# Patient Record
Sex: Female | Born: 1970 | Race: White | Hispanic: No | Marital: Married | Smoking: Never smoker
Health system: Southern US, Community
[De-identification: ages and names within clinical notes are randomized; demographics above are authoritative.]

## PROBLEM LIST (undated history)

## (undated) ENCOUNTER — Emergency Department (HOSPITAL_COMMUNITY): Disposition: A | Discharge status: Home / Self Care | Payer: Self-pay

## (undated) DIAGNOSIS — I10 Essential (primary) hypertension: Secondary | ICD-10-CM

## (undated) DIAGNOSIS — M4640 Discitis, unspecified, site unspecified: Secondary | ICD-10-CM

## (undated) DIAGNOSIS — G2581 Restless legs syndrome: Secondary | ICD-10-CM

## (undated) DIAGNOSIS — M549 Dorsalgia, unspecified: Secondary | ICD-10-CM

## (undated) DIAGNOSIS — E039 Hypothyroidism, unspecified: Secondary | ICD-10-CM

## (undated) DIAGNOSIS — K760 Fatty (change of) liver, not elsewhere classified: Secondary | ICD-10-CM

## (undated) DIAGNOSIS — I340 Nonrheumatic mitral (valve) insufficiency: Secondary | ICD-10-CM

## (undated) DIAGNOSIS — F419 Anxiety disorder, unspecified: Secondary | ICD-10-CM

## (undated) DIAGNOSIS — M797 Fibromyalgia: Secondary | ICD-10-CM

## (undated) DIAGNOSIS — M160 Bilateral primary osteoarthritis of hip: Secondary | ICD-10-CM

## (undated) DIAGNOSIS — K219 Gastro-esophageal reflux disease without esophagitis: Secondary | ICD-10-CM

## (undated) DIAGNOSIS — F32A Depression, unspecified: Secondary | ICD-10-CM

## (undated) DIAGNOSIS — M461 Sacroiliitis, not elsewhere classified: Secondary | ICD-10-CM

## (undated) DIAGNOSIS — Z8719 Personal history of other diseases of the digestive system: Secondary | ICD-10-CM

## (undated) DIAGNOSIS — E785 Hyperlipidemia, unspecified: Secondary | ICD-10-CM

## (undated) DIAGNOSIS — G894 Chronic pain syndrome: Secondary | ICD-10-CM

## (undated) DIAGNOSIS — E079 Disorder of thyroid, unspecified: Secondary | ICD-10-CM

## (undated) DIAGNOSIS — E119 Type 2 diabetes mellitus without complications: Secondary | ICD-10-CM

## (undated) DIAGNOSIS — Z9889 Other specified postprocedural states: Secondary | ICD-10-CM

## (undated) DIAGNOSIS — M545 Low back pain, unspecified: Secondary | ICD-10-CM

## (undated) DIAGNOSIS — G8929 Other chronic pain: Secondary | ICD-10-CM

## (undated) DIAGNOSIS — G4733 Obstructive sleep apnea (adult) (pediatric): Secondary | ICD-10-CM

## (undated) DIAGNOSIS — Z79899 Other long term (current) drug therapy: Secondary | ICD-10-CM

## (undated) HISTORY — DX: Discitis, unspecified, site unspecified: M46.40

## (undated) HISTORY — DX: Nonrheumatic mitral (valve) insufficiency: I34.0

## (undated) HISTORY — DX: Type 2 diabetes mellitus without complications: E11.9

## (undated) HISTORY — DX: Gastro-esophageal reflux disease without esophagitis: K21.9

## (undated) HISTORY — DX: Hyperlipidemia, unspecified: E78.5

## (undated) HISTORY — DX: Other specified postprocedural states: Z98.890

## (undated) HISTORY — PX: LASIK: SHX215

## (undated) HISTORY — DX: Sacroiliitis, not elsewhere classified: M46.1

## (undated) HISTORY — PX: CERVICAL SPINE SURGERY: SHX589

## (undated) HISTORY — DX: Other long term (current) drug therapy: Z79.899

## (undated) HISTORY — DX: Anxiety disorder, unspecified: F41.9

## (undated) HISTORY — DX: Fatty (change of) liver, not elsewhere classified: K76.0

## (undated) HISTORY — PX: COLONOSCOPY: SHX174

## (undated) HISTORY — DX: Other chronic pain: G89.29

## (undated) HISTORY — DX: Dorsalgia, unspecified: M54.9

## (undated) HISTORY — DX: Disorder of thyroid, unspecified: E07.9

## (undated) HISTORY — DX: Bilateral primary osteoarthritis of hip: M16.0

## (undated) HISTORY — DX: Personal history of other diseases of the digestive system: Z87.19

## (undated) HISTORY — DX: Obstructive sleep apnea (adult) (pediatric): G47.33

## (undated) HISTORY — DX: Low back pain, unspecified: M54.50

## (undated) HISTORY — DX: Chronic pain syndrome: G89.4

## (undated) HISTORY — DX: Hypothyroidism, unspecified: E03.9

## (undated) HISTORY — PX: CHOLECYSTECTOMY: SHX55

## (undated) HISTORY — PX: TRANSTHORACIC ECHOCARDIOGRAM: SHX275

## (undated) HISTORY — DX: Depression, unspecified: F32.A

## (undated) HISTORY — PX: ESOPHAGOGASTRODUODENOSCOPY: SHX1529

## (undated) HISTORY — DX: Restless legs syndrome: G25.81

---

## 1996-03-28 HISTORY — PX: PLANTAR FASCIA SURGERY: SHX746

## 2007-03-05 ENCOUNTER — Encounter: Admission: RE | Admit: 2007-03-05 | Discharge: 2007-03-05 | Payer: Self-pay | Admitting: Internal Medicine

## 2007-03-05 IMAGING — US US ABDOMEN COMPLETE
1 series · 14 of 25 positions shown · non-contrast
Comparison: none

CLINICAL DATA: Elevated liver enzymes.
 ABDOMEN ULTRASOUND:
TECHNIQUE: Complete abdominal ultrasound examination was performed including evaluation of the liver, gallbladder, bile ducts, pancreas, kidneys, spleen, IVC, and abdominal aorta.
 The gallbladder has previously been removed.  The liver is diffusely echogenic consistent with fatty infiltration.   The common bile duct is normal measuring between 4 and 5 mm in diameter.  The IVC, pancreas, and spleen appear normal.  No hydronephrosis is noted.  The right kidney measures 11.9 cm sagittally with the left kidney measuring 12.9 cm.  The abdominal aorta is normal in caliber.  This study is somewhat limited by body habitus.

[Series 1: us abdomen complete · 0.24mm/px · 14 of 83 slices shown]
[im 1/83]
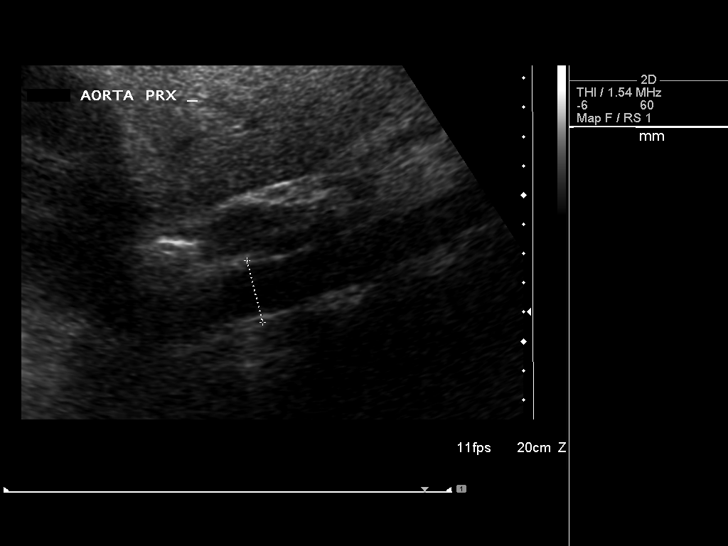
[im 7/83]
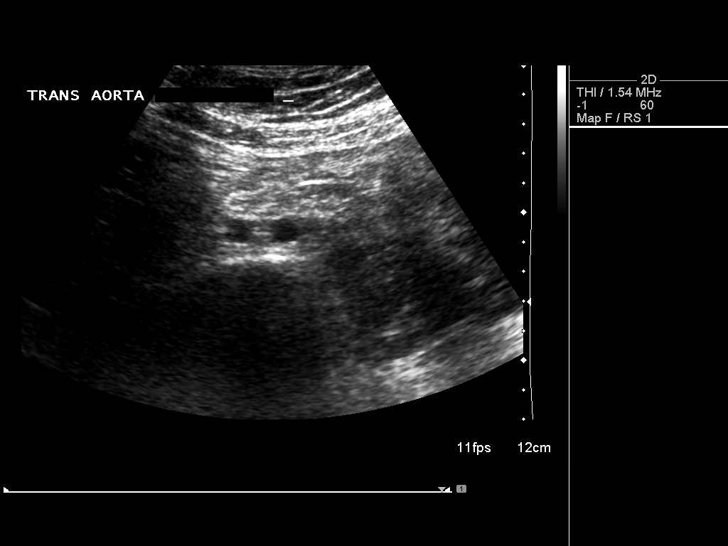
[im 14/83]
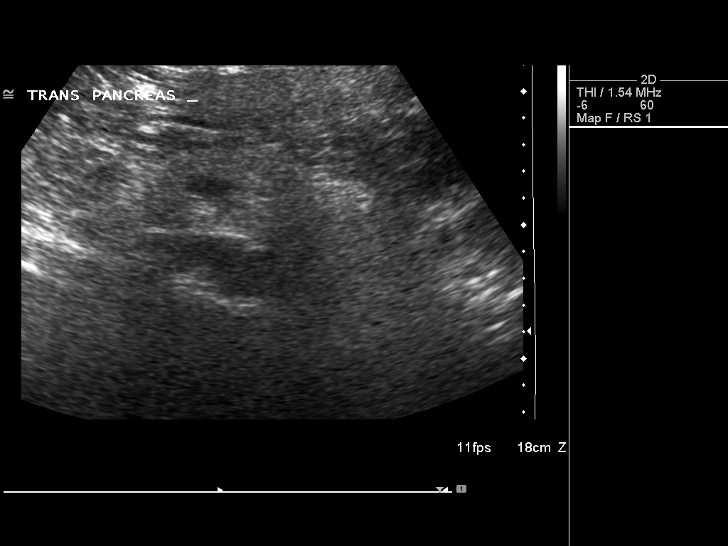
[im 21/83]
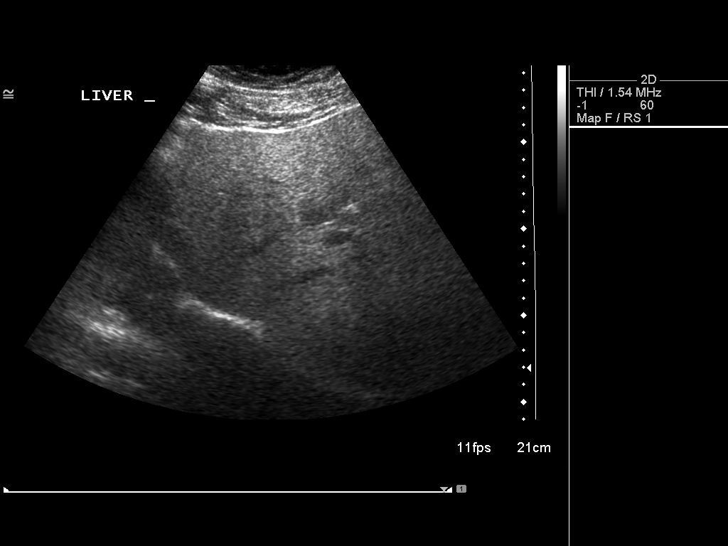
[im 28/83]
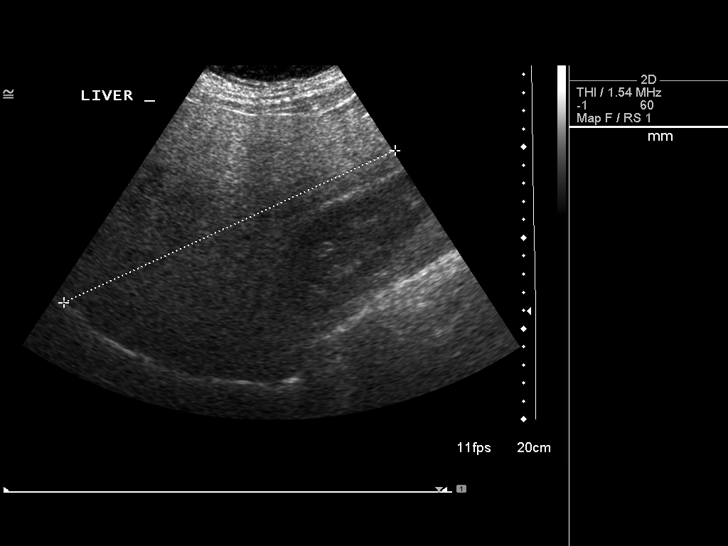
[im 31/83]
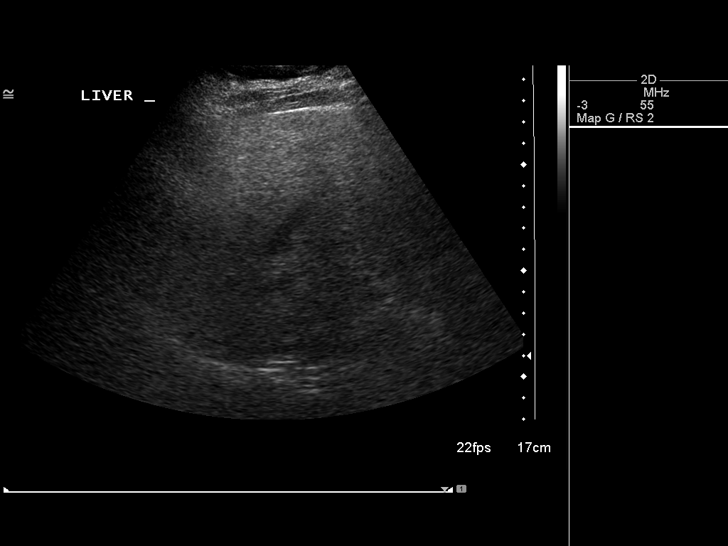
[im 38/83]
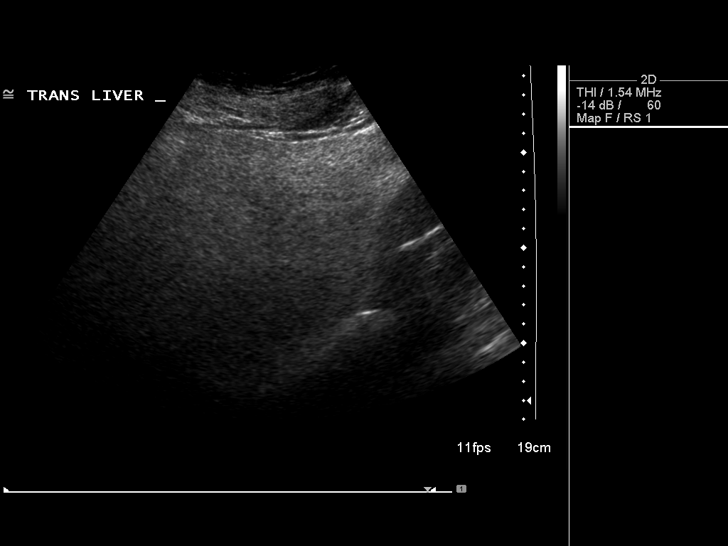
[im 45/83]
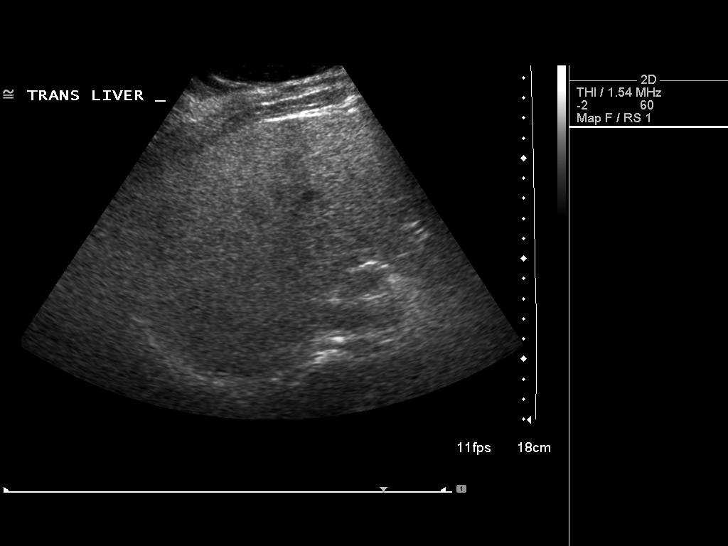
[im 52/83]
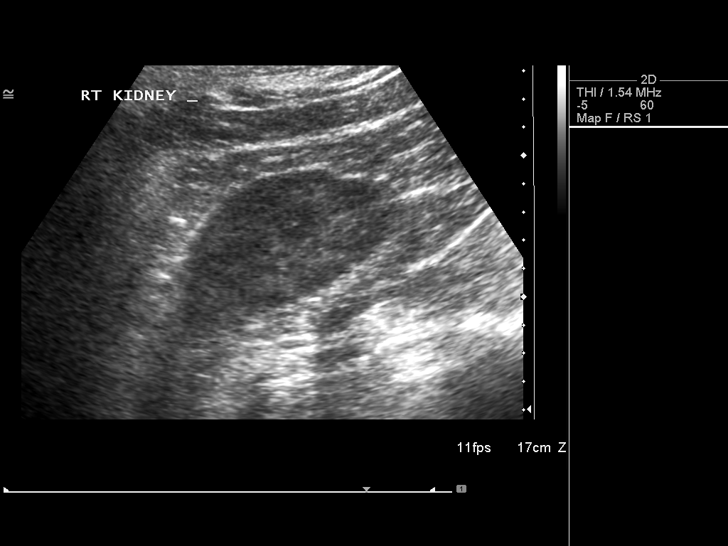
[im 55/83]
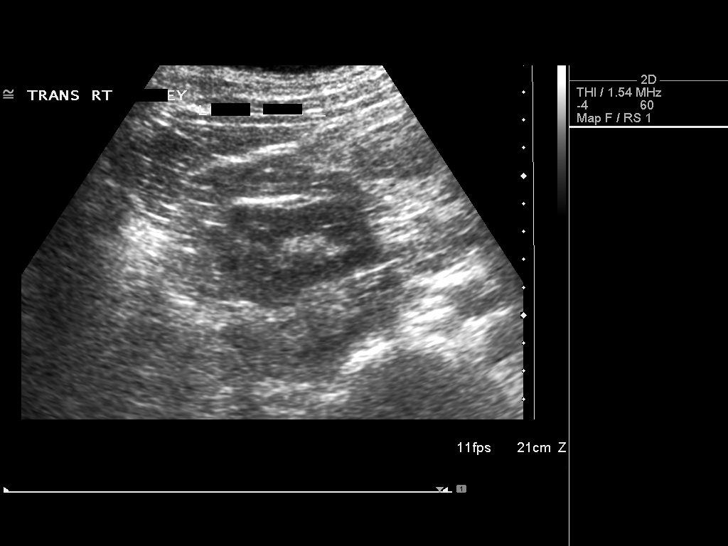
[im 62/83]
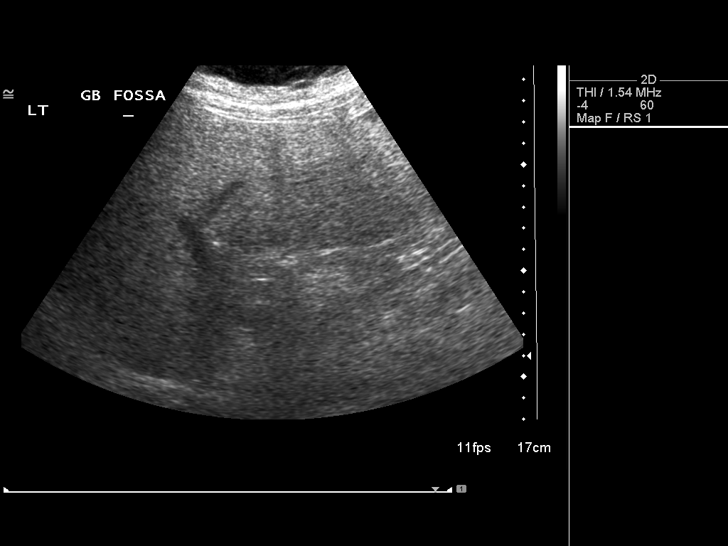
[im 69/83]
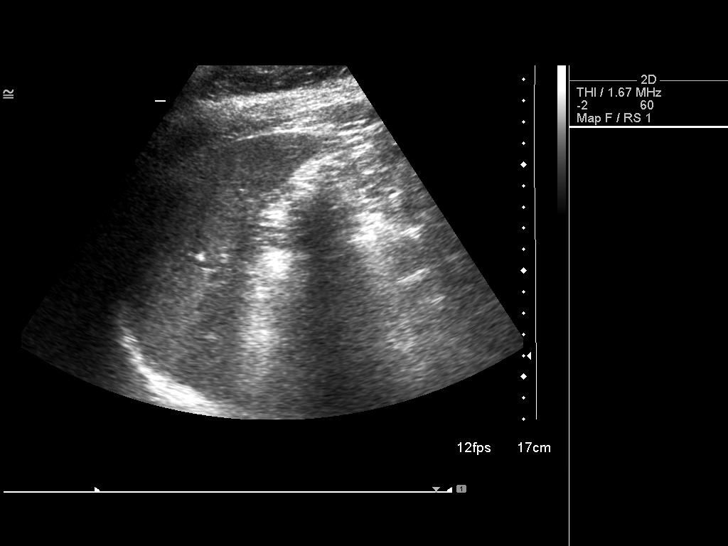
[im 76/83]
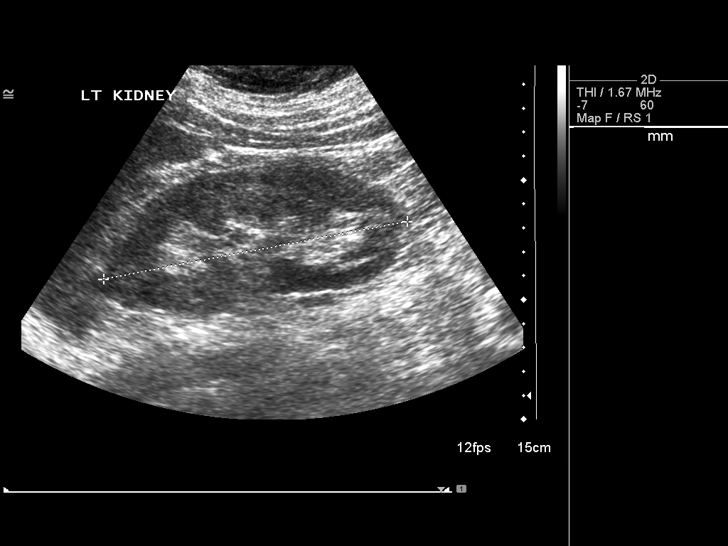
[im 83/83]
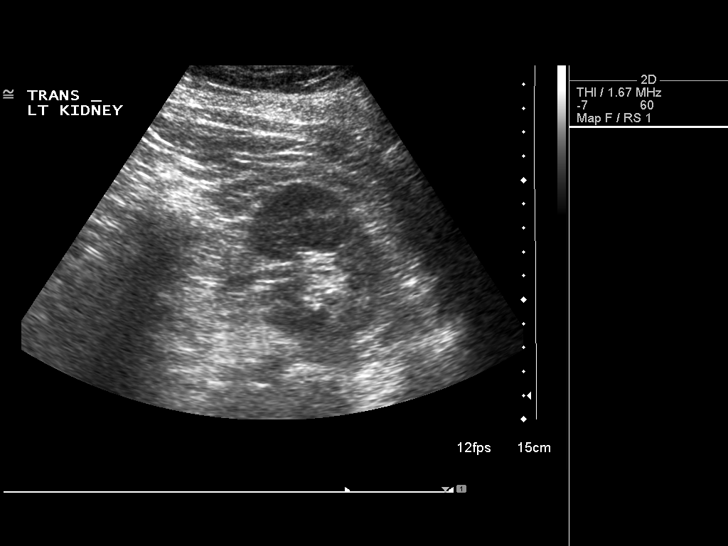

[14 of 25 positions shown; findings below may reference images not displayed]

IMPRESSION: 1.  Fatty infiltration of the liver.
 2.  Prior cholecystectomy.
 3.  Study is somewhat limited by body habitus.

## 2009-11-04 ENCOUNTER — Ambulatory Visit (HOSPITAL_COMMUNITY): Admission: RE | Admit: 2009-11-04 | Discharge: 2009-11-04 | Payer: Self-pay | Admitting: Family Medicine

## 2009-11-04 IMAGING — CR DG CHEST 2V
2 series · 2 of 2 positions shown · non-contrast
Comparison: None

CLINICAL DATA: Left chest pain, costochondritis

CHEST - 2 VIEW

[view not recorded (1 of 2)]
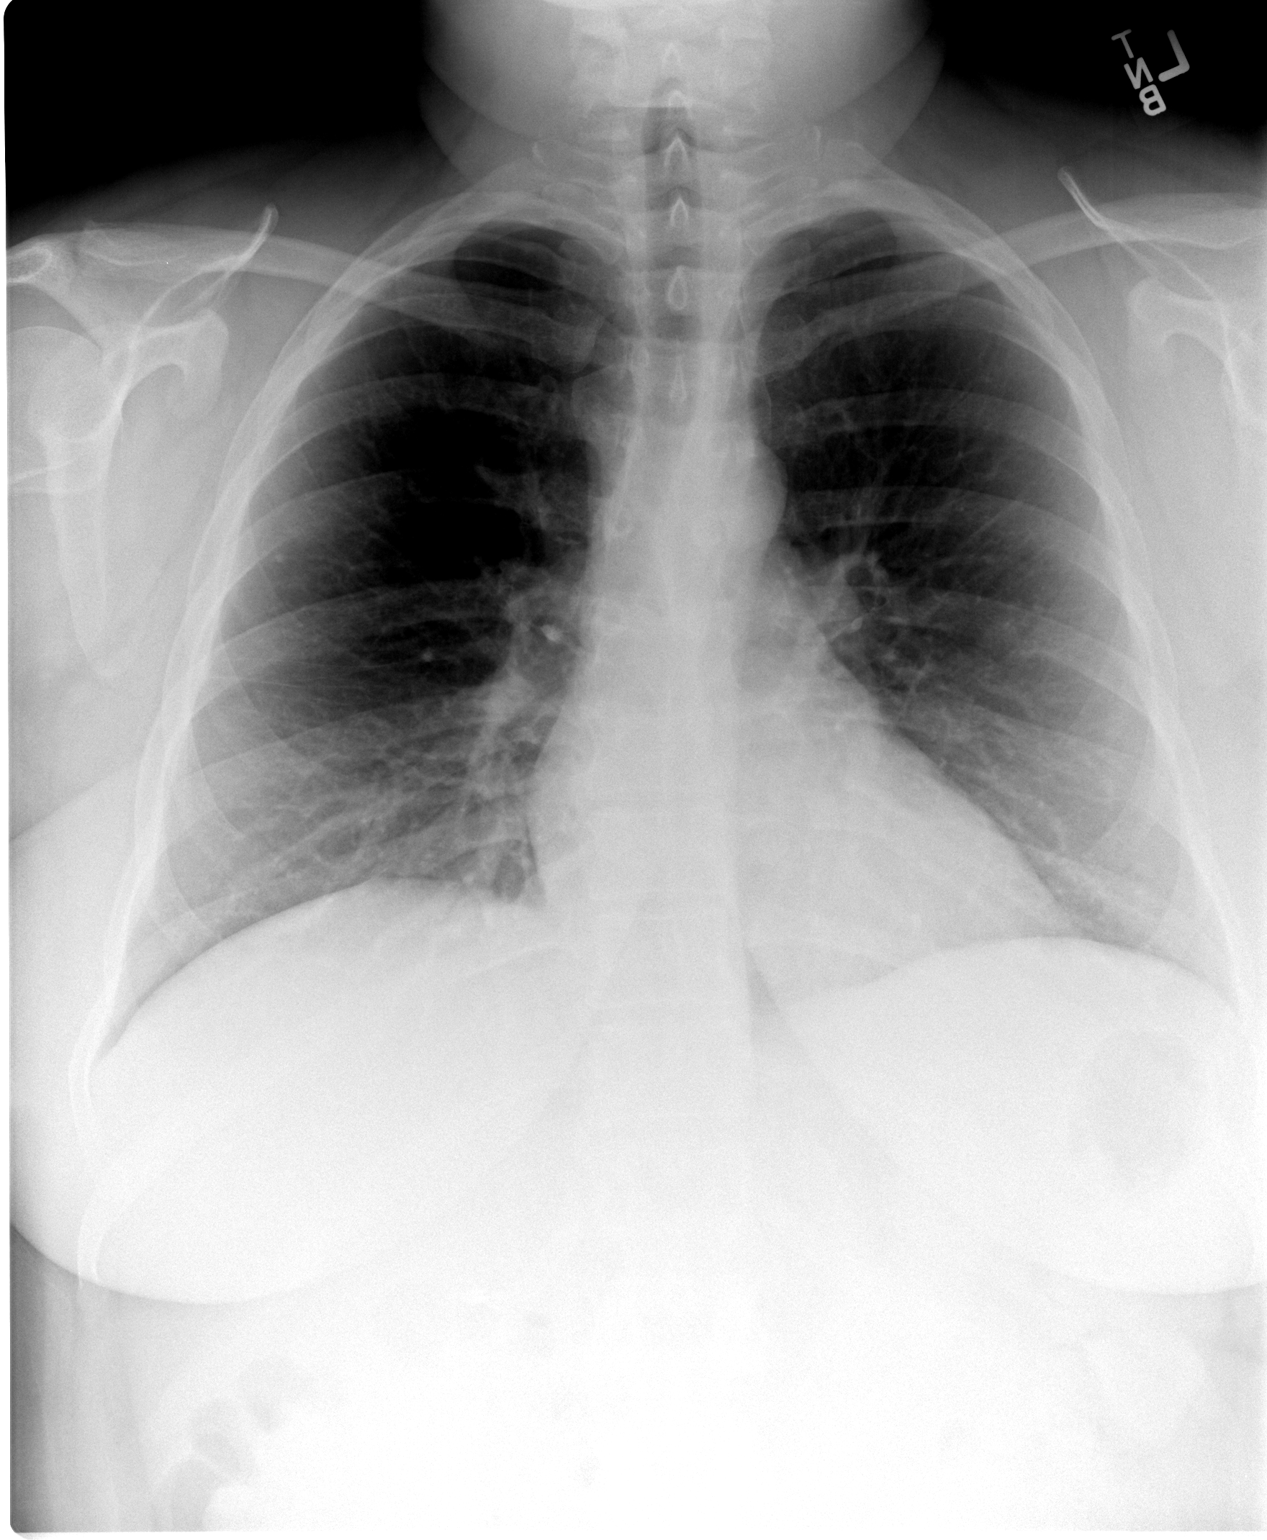

[view not recorded (2 of 2)]
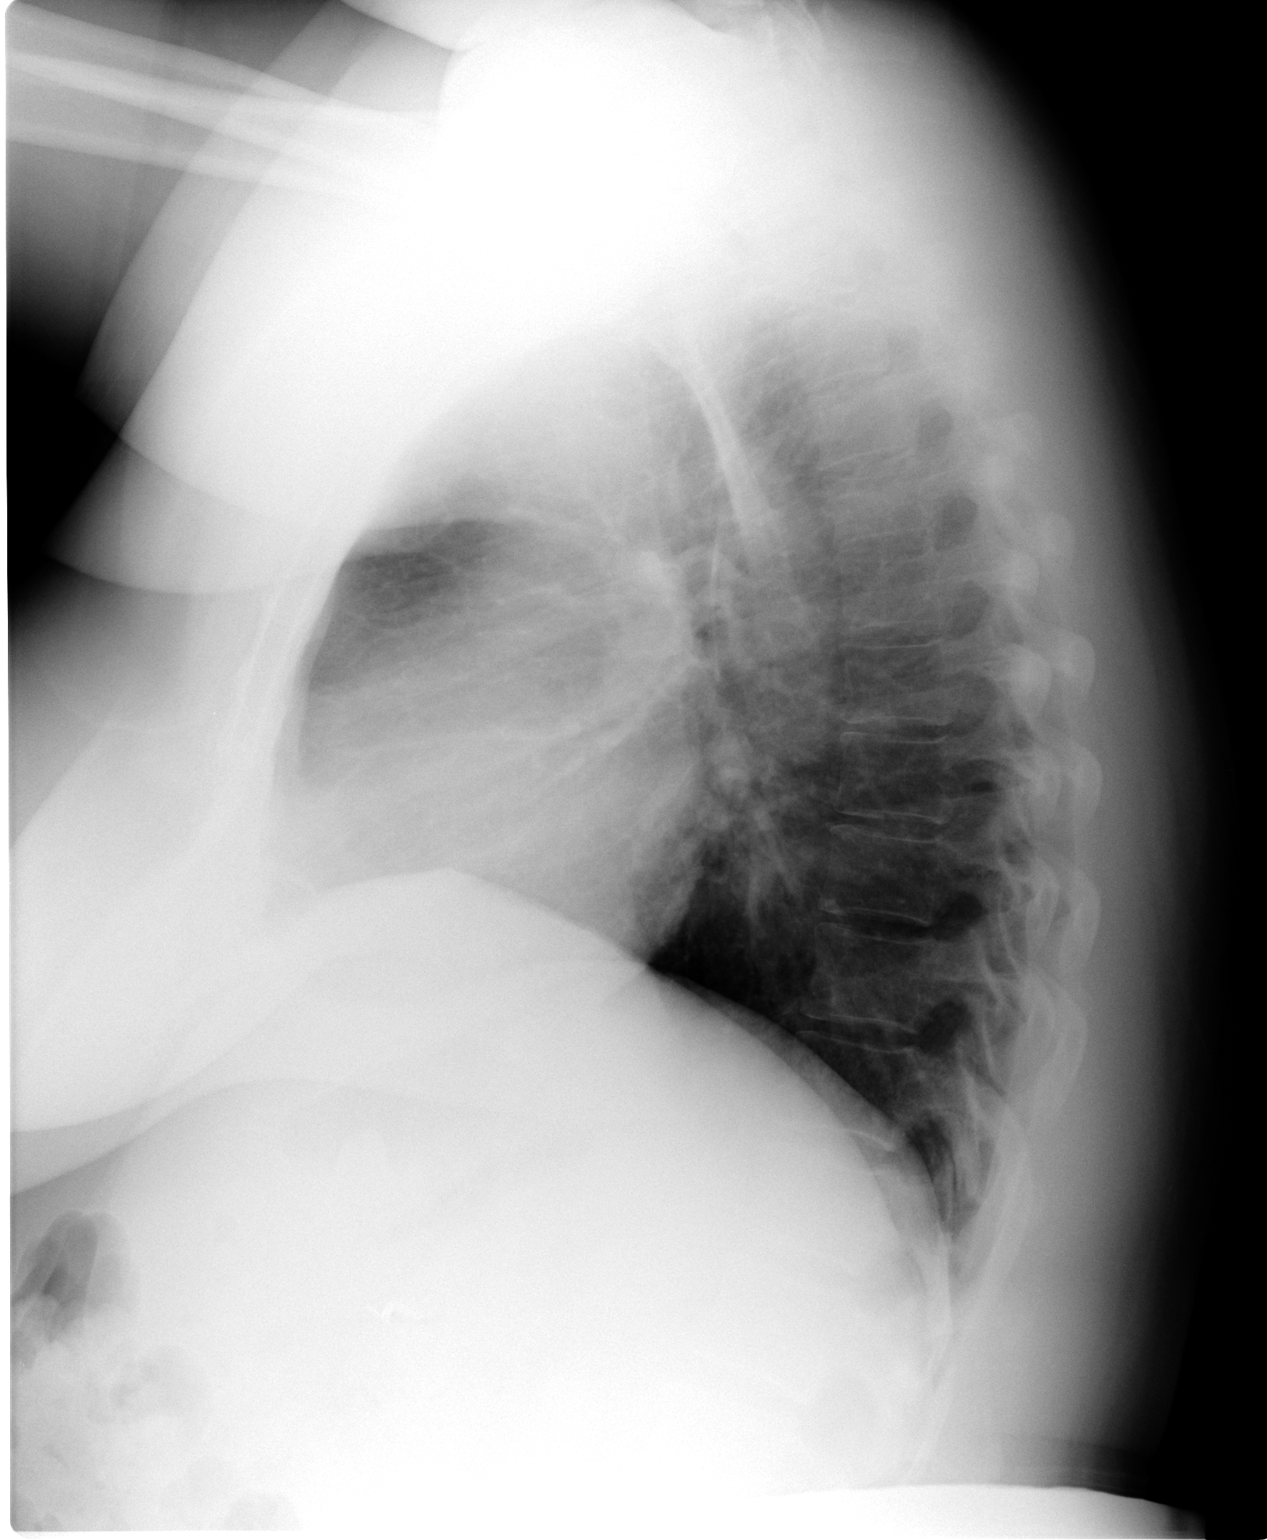

[2 of 2 positions shown; findings below may reference images not displayed]

FINDINGS: Normal heart size, mediastinal contours, and pulmonary vascularity.
Lungs clear.
No pleural effusion or pneumothorax.
Minimal broad-based dextroconvex thoracic scoliosis.
No chest wall abnormalities radiographically identified.
IMPRESSION: No acute abnormalities.

## 2010-04-18 ENCOUNTER — Encounter: Payer: Self-pay | Admitting: Internal Medicine

## 2010-06-30 ENCOUNTER — Ambulatory Visit: Payer: Self-pay | Admitting: Gynecology

## 2012-08-09 ENCOUNTER — Ambulatory Visit
Admission: RE | Admit: 2012-08-09 | Discharge: 2012-08-09 | Disposition: A | Discharge status: Home / Self Care | Payer: BC Managed Care – PPO | Source: Ambulatory Visit | Attending: Allergy and Immunology | Admitting: Allergy and Immunology

## 2012-08-09 ENCOUNTER — Other Ambulatory Visit: Payer: Self-pay | Admitting: Allergy and Immunology

## 2012-08-09 DIAGNOSIS — R05 Cough: Secondary | ICD-10-CM

## 2012-08-09 IMAGING — CR DG CHEST 2V
2 series · 2 of 2 positions shown · non-contrast
Comparison: PA and lateral chest [DATE].

CLINICAL DATA: Cough and shortness of breath.

CHEST - 2 VIEW

[w chest pa]
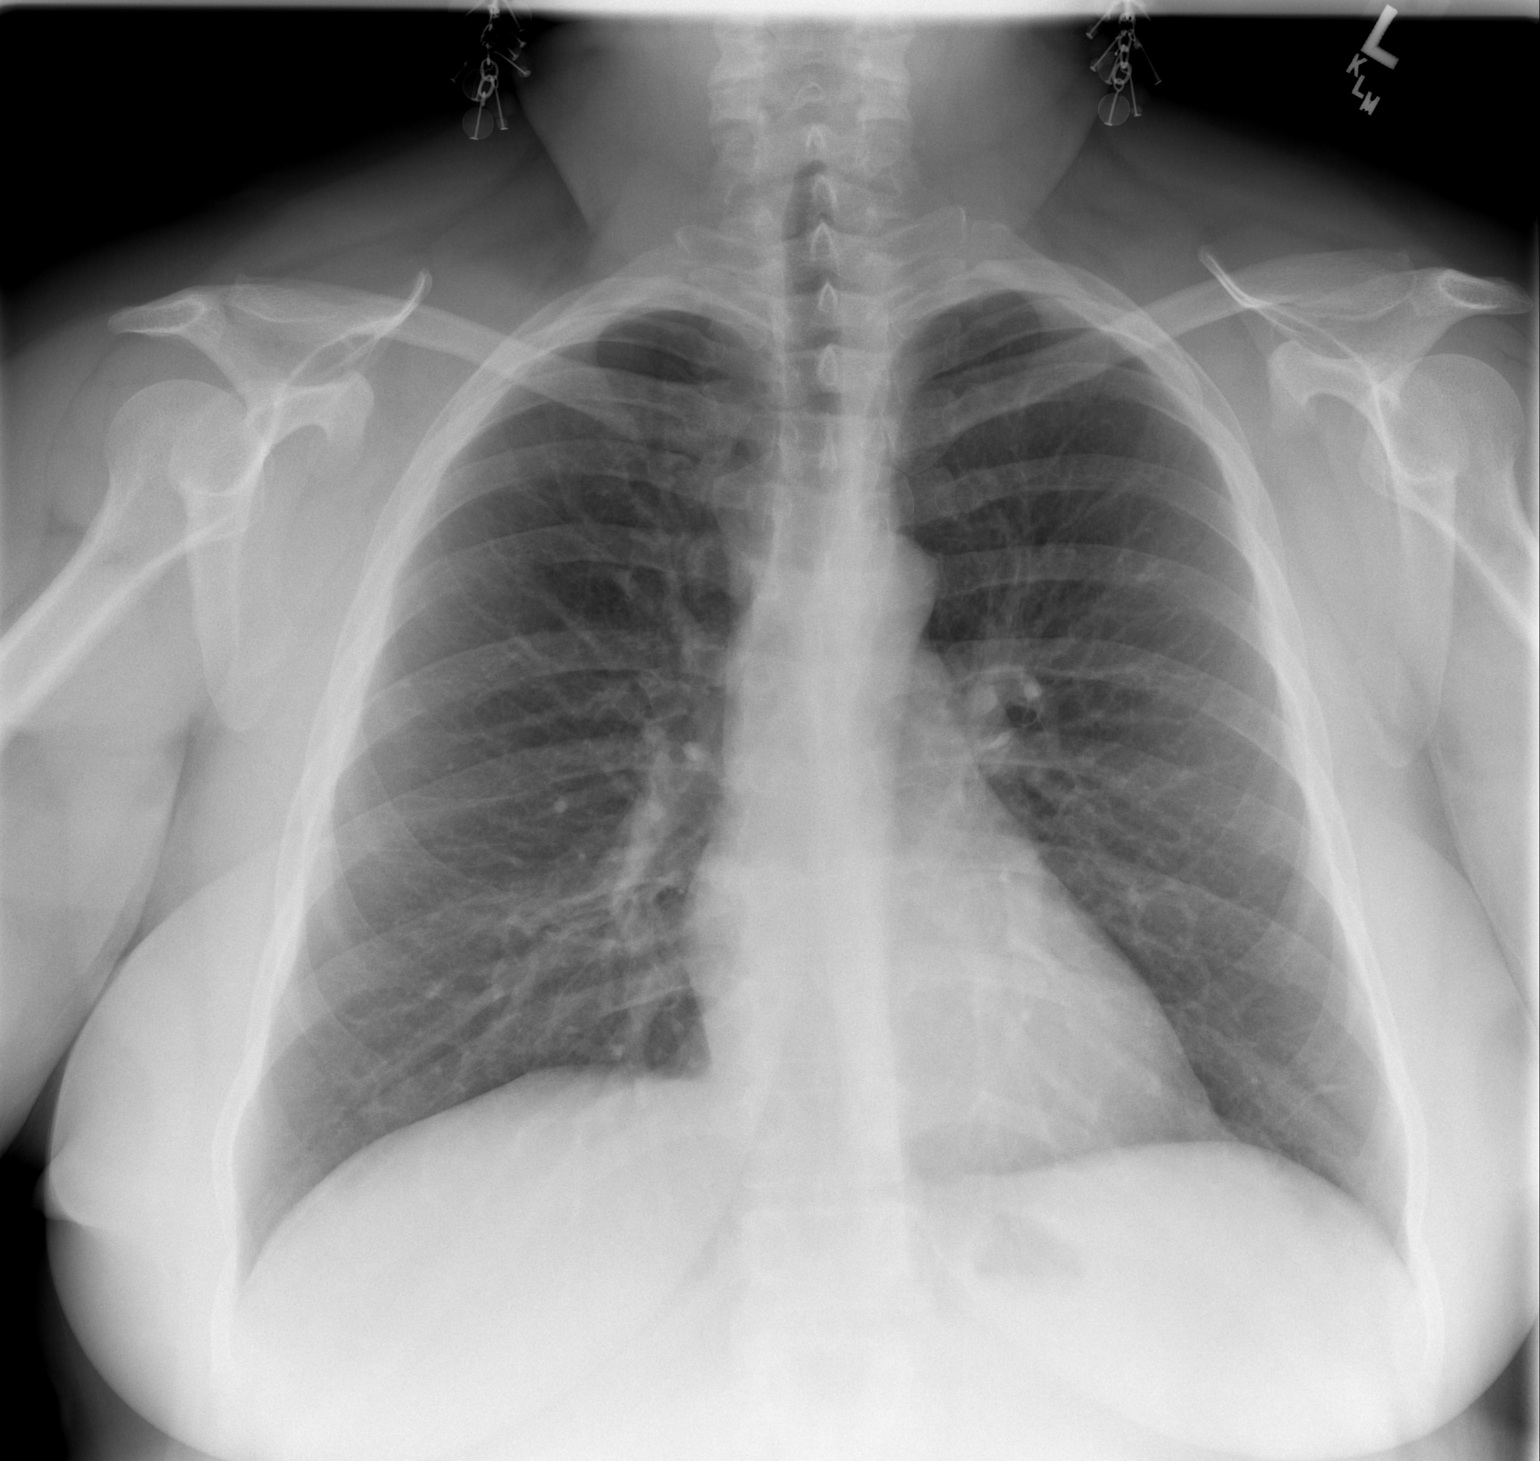

[w chest lat]
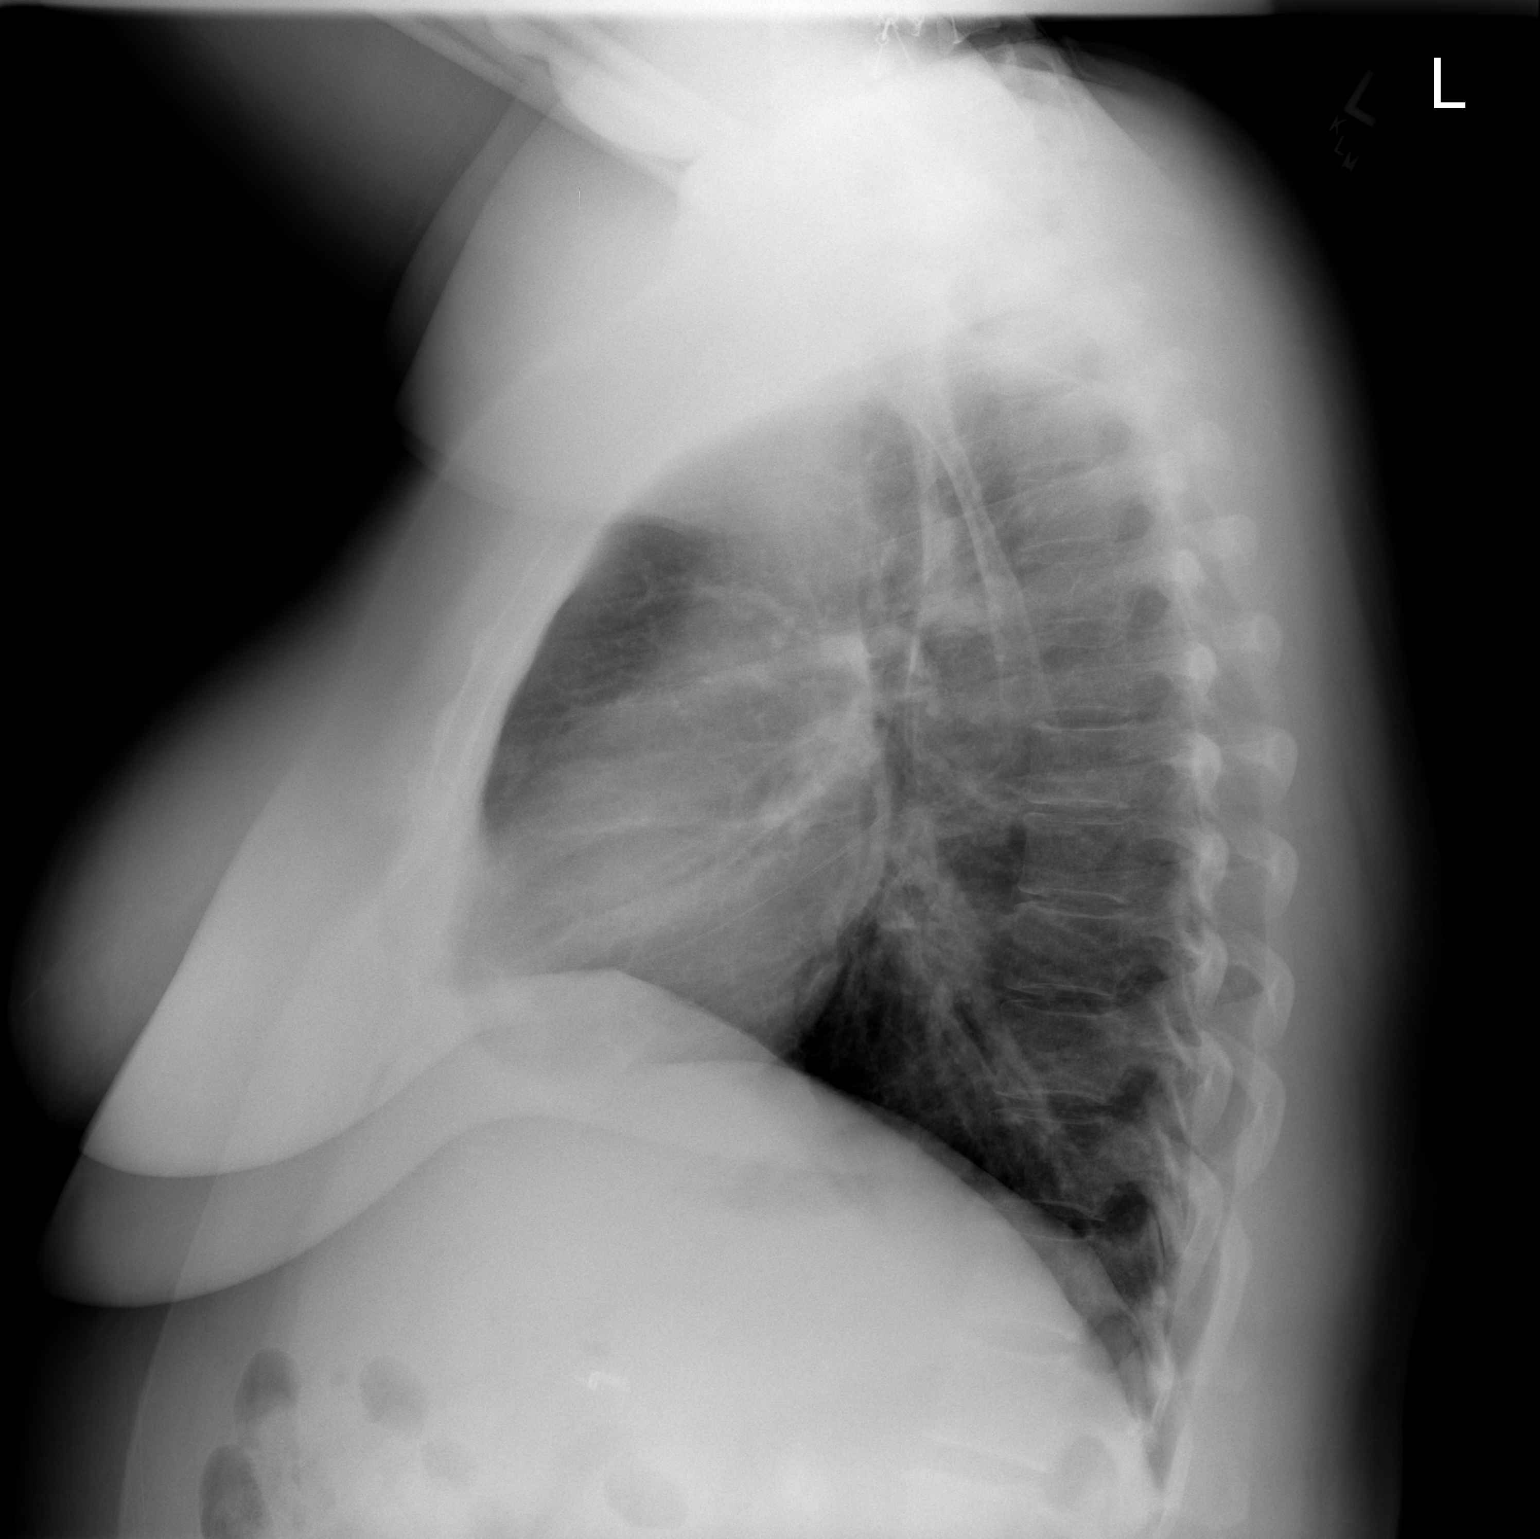

[2 of 2 positions shown; findings below may reference images not displayed]

FINDINGS: Lungs are clear.  Heart size is normal.  No pneumothorax
or pleural effusion.
IMPRESSION: Negative chest.

## 2013-06-24 ENCOUNTER — Other Ambulatory Visit (HOSPITAL_COMMUNITY): Payer: Self-pay | Admitting: Physician Assistant

## 2013-06-24 ENCOUNTER — Ambulatory Visit (HOSPITAL_COMMUNITY)
Admission: RE | Admit: 2013-06-24 | Discharge: 2013-06-24 | Disposition: A | Discharge status: Home / Self Care | Payer: BC Managed Care – PPO | Source: Ambulatory Visit | Attending: Physician Assistant | Admitting: Physician Assistant

## 2013-06-24 DIAGNOSIS — R079 Chest pain, unspecified: Secondary | ICD-10-CM

## 2013-06-24 DIAGNOSIS — W19XXXA Unspecified fall, initial encounter: Secondary | ICD-10-CM | POA: Insufficient documentation

## 2013-06-24 IMAGING — CR DG CHEST 2V
2 series · 2 of 2 positions shown · non-contrast
Comparison: [DATE]

CLINICAL DATA: Left chest pain, fall

EXAM:
CHEST  2 VIEW

[view not recorded (1 of 2)]
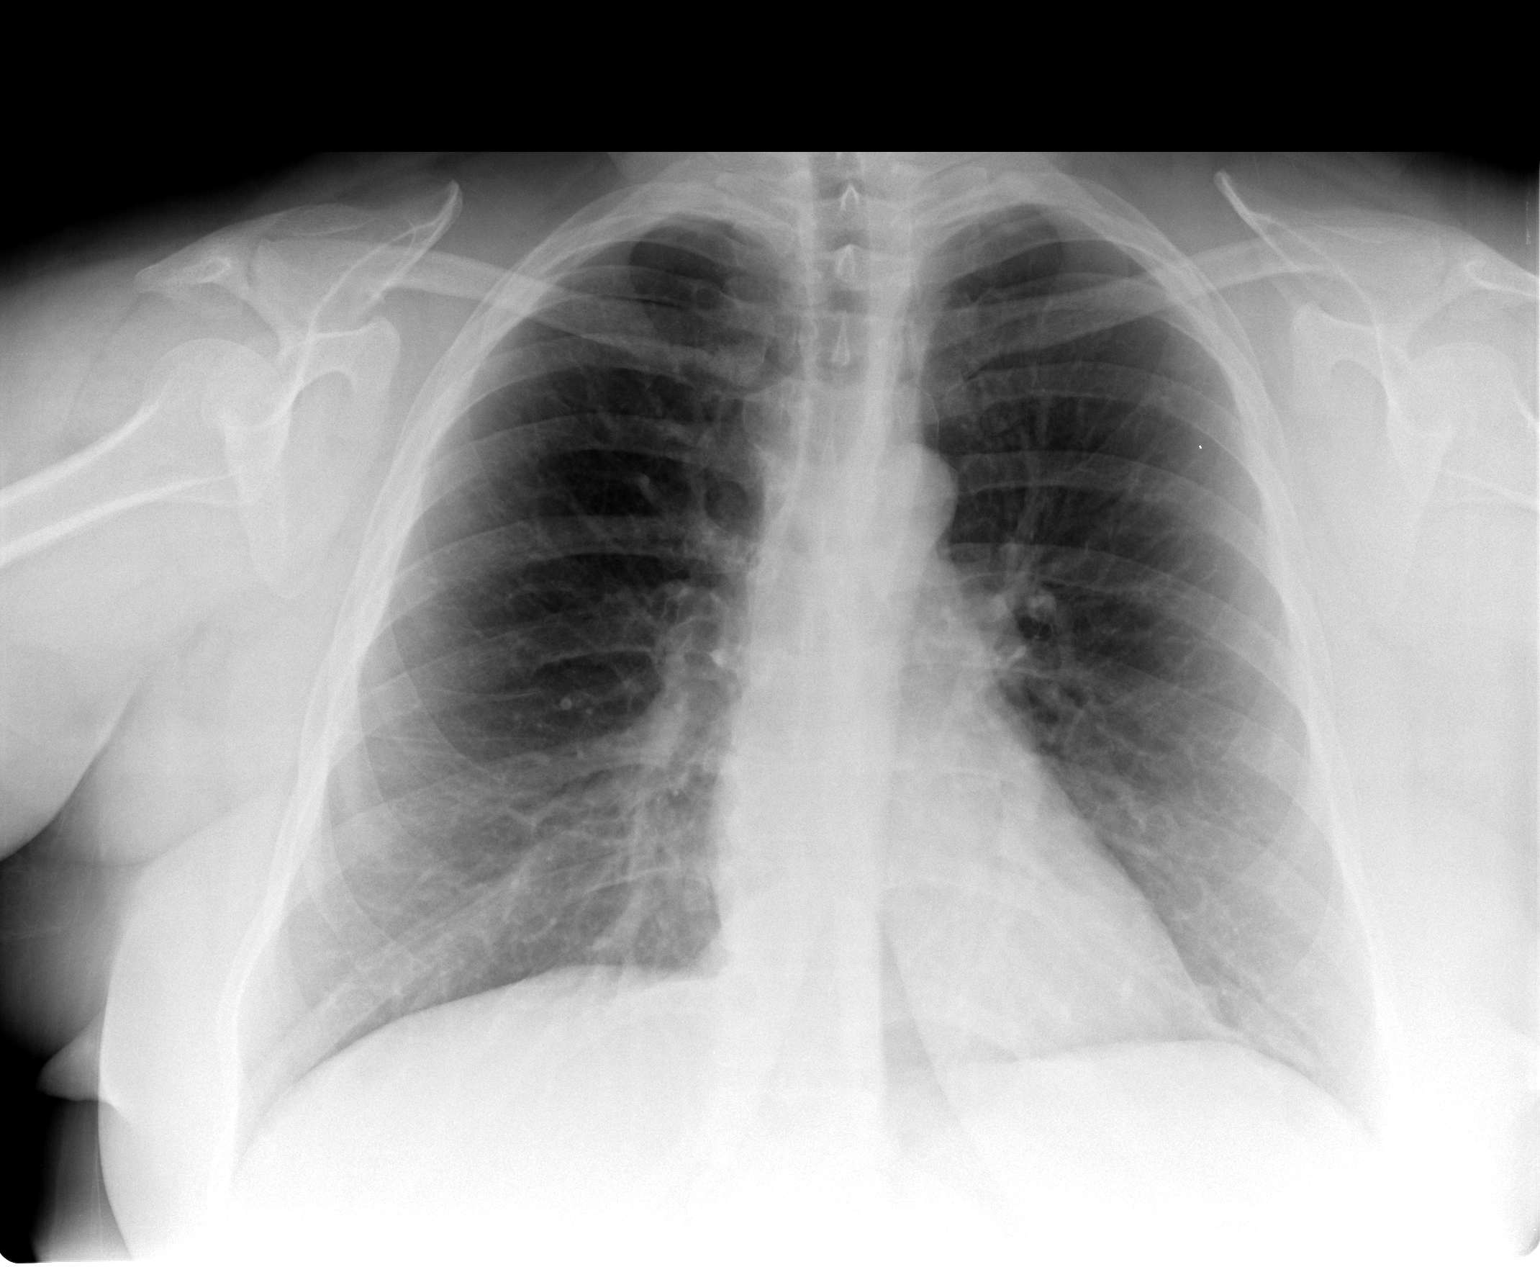

[view not recorded (2 of 2)]
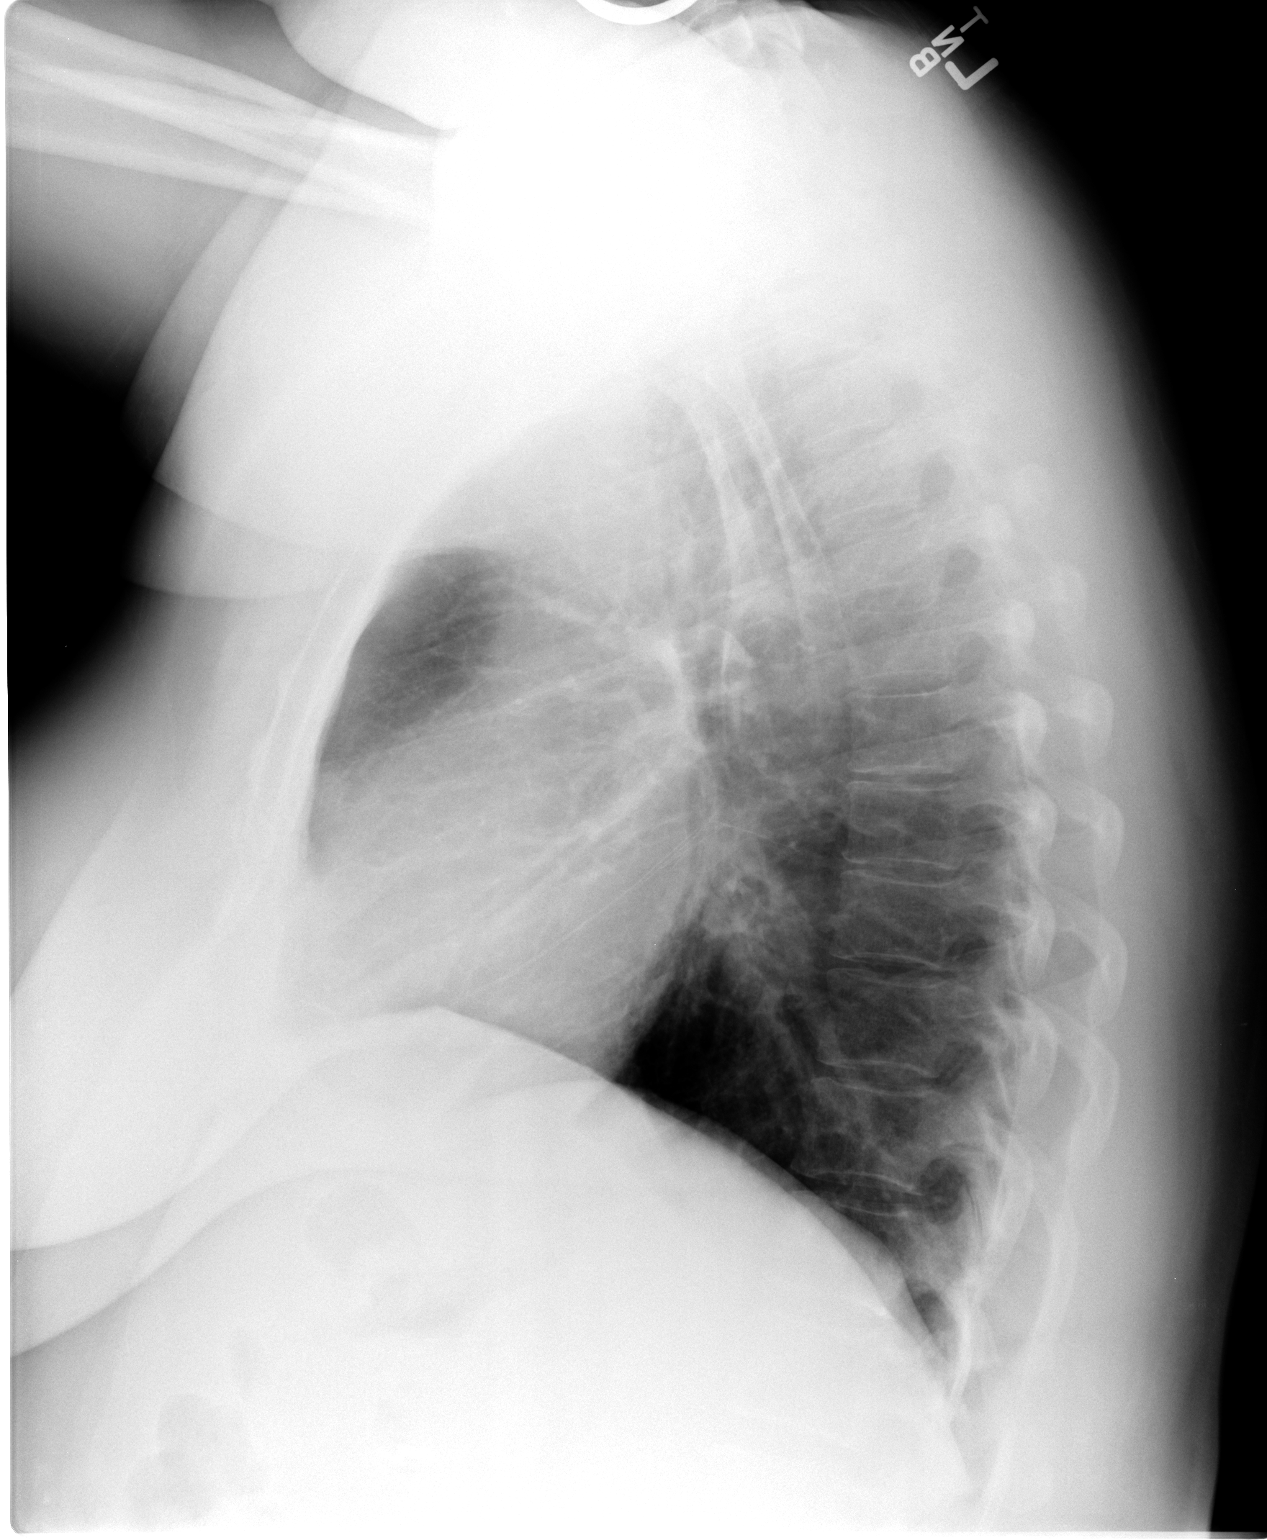

[2 of 2 positions shown; findings below may reference images not displayed]

FINDINGS: The heart size and mediastinal contours are within normal limits.
Both lungs are clear. The visualized skeletal structures are
unremarkable. Stable mild upper thoracic scoliosis. Prior
cholecystectomy noted.
IMPRESSION: No active cardiopulmonary disease.

## 2013-10-04 ENCOUNTER — Encounter (HOSPITAL_COMMUNITY): Payer: Self-pay | Admitting: Emergency Medicine

## 2013-10-04 ENCOUNTER — Emergency Department (HOSPITAL_COMMUNITY)
Admission: EM | Admit: 2013-10-04 | Discharge: 2013-10-04 | Disposition: A | Discharge status: Home / Self Care | Payer: Worker's Compensation | Attending: Emergency Medicine | Admitting: Emergency Medicine

## 2013-10-04 DIAGNOSIS — Z79899 Other long term (current) drug therapy: Secondary | ICD-10-CM | POA: Insufficient documentation

## 2013-10-04 DIAGNOSIS — I10 Essential (primary) hypertension: Secondary | ICD-10-CM | POA: Insufficient documentation

## 2013-10-04 DIAGNOSIS — S0990XA Unspecified injury of head, initial encounter: Secondary | ICD-10-CM | POA: Diagnosis present

## 2013-10-04 DIAGNOSIS — S0100XA Unspecified open wound of scalp, initial encounter: Secondary | ICD-10-CM | POA: Diagnosis not present

## 2013-10-04 DIAGNOSIS — Y929 Unspecified place or not applicable: Secondary | ICD-10-CM | POA: Insufficient documentation

## 2013-10-04 DIAGNOSIS — W1809XA Striking against other object with subsequent fall, initial encounter: Secondary | ICD-10-CM | POA: Insufficient documentation

## 2013-10-04 DIAGNOSIS — S0191XA Laceration without foreign body of unspecified part of head, initial encounter: Secondary | ICD-10-CM

## 2013-10-04 DIAGNOSIS — Y9389 Activity, other specified: Secondary | ICD-10-CM | POA: Diagnosis not present

## 2013-10-04 DIAGNOSIS — IMO0001 Reserved for inherently not codable concepts without codable children: Secondary | ICD-10-CM | POA: Insufficient documentation

## 2013-10-04 HISTORY — DX: Fibromyalgia: M79.7

## 2013-10-04 HISTORY — DX: Essential (primary) hypertension: I10

## 2013-10-04 MED ORDER — HYDROMORPHONE HCL PF 2 MG/ML IJ SOLN
2.0000 mg | Freq: Once | INTRAMUSCULAR | Status: AC
  Administered 2013-10-04: 2 mg via INTRAMUSCULAR
  Filled 2013-10-04: qty 1

## 2013-10-04 MED ORDER — ONDANSETRON 4 MG PO TBDP
4.0000 mg | ORAL_TABLET | Freq: Once | ORAL | Status: AC
  Administered 2013-10-04: 4 mg via ORAL
  Filled 2013-10-04: qty 1

## 2013-10-04 NOTE — ED Provider Notes (Signed)
CSN: [id number]     Arrival date & time 10/04/13  1655 History   First MD Initiated Contact with Patient 10/04/13 1711     Chief Complaint  Patient presents with  . Fall  . 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 She was rushing to a Code when she slipped, fell backward and hit her occiput. + HA. Denies LOC, visual changes, unilateral weakness, difficulty with speech. Last tetanus vaccination was 5 years ago. She suffered a 1 cm laceration to the occipital region. Bleeding controlled.  Patient is a 43 y.o. female presenting with fall, head injury, and scalp laceration.  Fall This is a new problem. The current episode started today. The problem has been gradually improving. Associated symptoms include headaches. Pertinent negatives include no abdominal pain, anorexia, arthralgias, change in bowel habit, chest pain, chills, congestion, coughing, diaphoresis, fatigue, fever, myalgias, nausea, neck pain, numbness, rash, sore throat, swollen glands, urinary symptoms, vertigo, visual change, vomiting or weakness.  Head Injury Associated symptoms: headache   Associated symptoms: no nausea, no neck pain, no numbness and no vomiting   Head Laceration This is a new problem. The current episode started today. Associated symptoms include headaches. Pertinent negatives include no abdominal pain, anorexia, arthralgias, change in bowel habit, chest pain, chills, congestion, coughing, diaphoresis, fatigue, fever, myalgias, nausea, neck pain, numbness, rash, sore throat, swollen glands, urinary symptoms, vertigo, visual change, vomiting or weakness.    Past Medical History  Diagnosis Date  . Hypertension   . Fibromyalgia    Past Surgical History  Procedure Laterality Date  . Cholecystectomy     No family history on file. History  Substance Use Topics  . Smoking status:  Never Smoker   . Smokeless tobacco: Not on file  . Alcohol Use: No   OB History   Grav Para Term Preterm Abortions TAB SAB Ect Mult Living                 Review of Systems  Constitutional: Negative for fever, chills, diaphoresis and fatigue.  HENT: Negative for congestion and sore throat.   Respiratory: Negative for cough.   Cardiovascular: Negative for chest pain.  Gastrointestinal: Negative for nausea, vomiting, abdominal pain, anorexia and change in bowel habit.  Musculoskeletal: Negative for arthralgias, myalgias and neck pain.  Skin: Positive for wound. Negative for rash.  Neurological: Positive for headaches. Negative for vertigo, weakness and numbness.      Allergies  Sulfa antibiotics  Home Medications   Prior to Admission medications   Medication Sig Start Date End Date Taking? Authorizing Provider  buPROPion (WELLBUTRIN XL) 300 MG 24 hr tablet Take 300 mg by mouth daily.   Yes Historical Provider, MD  FLUoxetine (PROZAC) 40 MG capsule Take 40 mg by mouth daily.   Yes Historical Provider, MD  ibuprofen (ADVIL,MOTRIN) 200 MG tablet Take 800 mg by mouth every 6 (six) hours as needed for moderate pain.   Yes Historical Provider, MD  losartan (COZAAR) 100 MG tablet Take 100 mg by mouth daily.   Yes Historical Provider, MD  Omega-3 Fatty Acids (FISH OIL) 300 MG CAPS Take 1 capsule by mouth daily.   Yes Historical Provider, MD  pantoprazole (PROTONIX) 40 MG tablet Take 40 mg by mouth daily.   Yes Historical Provider, MD  phentermine (ADIPEX-P) 37.5 MG tablet Take 37.5 mg by mouth daily before breakfast.   Yes Historical Provider, MD  polyvinyl alcohol (LIQUIFILM TEARS) 1.4 %  ophthalmic solution Place 1 drop into both eyes as needed for dry eyes.   Yes Historical Provider, MD  simvastatin (ZOCOR) 40 MG tablet Take 40 mg by mouth daily.   Yes Historical Provider, MD  traMADol-acetaminophen (ULTRACET) 37.5-325 MG per tablet Take 1 tablet by mouth 4 (four) times daily.   Yes  Historical Provider, MD   BP 156/72  Pulse 82  Temp(Src) 98.6 F (37 C) (Oral)  Resp 20  SpO2 99%  LMP 09/07/2013 Physical Exam  Constitutional: She is oriented to person, place, and time. She appears well-developed and well-nourished. No distress.  HENT:  Head: Normocephalic.    Eyes: Conjunctivae are normal. No scleral icterus.  Neck: Normal range of motion.  Cardiovascular: Normal rate, regular rhythm and normal heart sounds.  Exam reveals no gallop and no friction rub.   No murmur heard. Pulmonary/Chest: Effort normal and breath sounds normal. No respiratory distress.  Abdominal: Soft. Bowel sounds are normal. She exhibits no distension and no mass. There is no tenderness. There is no guarding.  Neurological: She is alert and oriented to person, place, and time.  Skin: Skin is warm and dry. She is not diaphoretic.    ED Course  Procedures (including critical care time) Labs Review Labs Reviewed - No data to display  Imaging Review No results found.   EKG Interpretation None      LACERATION REPAIR Performed by: Arthor CaptainHarris, Damani Kelemen Authorized by: Arthor CaptainHarris, Evangelos Paulino Consent: Verbal consent obtained. Risks and benefits: risks, benefits and alternatives were discussed Consent given by: patient Patient identity confirmed: provided demographic data Prepped and Draped in normal sterile fashion Wound explored  Laceration Location: R occiput  Laceration Length: 1cm  No Foreign Bodies seen or palpated  Anesthesia: local infiltration  Local anesthetic:none Anesthetic total: N/A   Irrigation method: syringe Amount of cleaning: standard  Skin closure: Skin adhesive  Number of sutures: 1  Technique: Hair apposition  Patient tolerance: Patient tolerated the procedure well with no immediate complications.   MDM   Final diagnoses:  Laceration of head, initial encounter    Pressure irrigation performed. Laceration occurred < 8 hours prior to repair which was  well tolerated. Pt has no co morbidities to effect normal wound healing. Discussed adhesive home care w pt and answered questions.  Pt is hemodynamically stable w no complaints prior to dc.  No focal neuro deficits. Patient has pain meds at home    Arthor Captainbigail Johnae Friley, [place] JerseyPA-C 10/04/13 1905

## 2013-10-04 NOTE — ED Provider Notes (Signed)
Medical screening examination/treatment/procedure(s) were performed by non-physician practitioner and as supervising physician I was immediately available for consultation/collaboration.   Linwood DibblesJon Tracker Mance, MD 10/04/13 [phone]

## 2013-10-04 NOTE — Discharge Instructions (Signed)
Tissue Adhesive Wound Care  Some cuts, wounds, lacerations, and incisions can be repaired by using tissue adhesive. Tissue adhesive is like glue. It holds the skin together, allowing for faster healing. It forms a strong bond on the skin in about 1 minute and reaches its full strength in about 2 or 3 minutes. The adhesive disappears naturally while the wound is healing. It is important to take proper care of your wound at home while it heals.   HOME CARE INSTRUCTIONS    Showers are allowed. Do not soak the area containing the tissue adhesive. Do not take baths, swim, or use hot tubs. Do not use any soaps or ointments on the wound. Certain ointments can weaken the glue.   If a bandage (dressing) has been applied, follow your health care provider's instructions for how often to change the dressing.    Keep the dressing dry if one has been applied.    Do not scratch, pick, or rub the adhesive.    Do not place tape over the adhesive. The adhesive could come off when pulling the tape off.    Protect the wound from further injury until it is healed.    Protect the wound from sun and tanning bed exposure while it is healing and for several weeks after healing.    Only take over-the-counter or prescription medicines as directed by your health care provider.    Keep all follow-up appointments as directed by your health care provider.  SEEK IMMEDIATE MEDICAL CARE IF:    Your wound becomes red, swollen, hot, or tender.    You develop a rash after the glue is applied.   You have increasing pain in the wound.    You have a red streak that goes away from the wound.    You have pus coming from the wound.    You have increased bleeding.   You have a fever.   You have shaking chills.    You notice a bad smell coming from the wound.    Your wound or adhesive breaks open.   MAKE SURE YOU:    Understand these instructions.   Will watch your condition.   Will get help right away if you are not doing  well or get worse.  Document Released: 09/07/2000 Document Revised: 01/02/2013 Document Reviewed: 10/03/2012  ExitCare Patient Information 2015 [employer]. This information is not intended to replace advice given to you by your health care provider. Make sure you discuss any questions you have with your health care provider.          Head Injury  You have received a head injury. It does not appear serious at this time. Headaches and vomiting are common following head injury. It should be easy to awaken from sleeping. Sometimes it is necessary for you to stay in the emergency department for a while for observation. Sometimes admission to the hospital may be needed. After injuries such as yours, most problems occur within the first 24 hours, but side effects may occur up to 7-10 days after the injury. It is important for you to carefully monitor your condition and contact your health care provider or seek immediate medical care if there is a change in your condition.  WHAT ARE THE TYPES OF HEAD INJURIES?  Head injuries can be as minor as a bump. Some head injuries can be more severe. More severe head injuries include:   A jarring injury to the brain (concussion).   A bruise   serious head injury is most likely to happen to someone who is in a car wreck and is not wearing a seat belt. Other causes of major head injuries include bicycle or motorcycle accidents, sports injuries, and falls. HOW ARE HEAD INJURIES DIAGNOSED? A complete history of the event leading to the injury and your current symptoms will be helpful in diagnosing head injuries. Many times, pictures of the brain, such as CT or MRI are needed to see the extent of the injury. Often, an overnight hospital stay is necessary  for observation.  WHEN SHOULD I SEEK IMMEDIATE MEDICAL CARE?  You should get help right away if:  You have confusion or drowsiness.  You feel sick to your stomach (nauseous) or have continued, forceful vomiting.  You have dizziness or unsteadiness that is getting worse.  You have severe, continued headaches not relieved by medicine. Only take over-the-counter or prescription medicines for pain, fever, or discomfort as directed by your health care provider.  You do not have normal function of the arms or legs or are unable to walk.  You notice changes in the black spots in the center of the colored part of your eye (pupil).  You have a clear or bloody fluid coming from your nose or ears.  You have a loss of vision. During the next 24 hours after the injury, you must stay with someone who can watch you for the warning signs. This person should contact local emergency services (911 in the [place] if you have seizures, you become unconscious, or you are unable to wake up. HOW CAN I PREVENT A HEAD INJURY IN THE FUTURE? The most important factor for preventing major head injuries is avoiding motor vehicle accidents. To minimize the potential for damage to your head, it is crucial to wear seat belts while riding in motor vehicles. Wearing helmets while bike riding and playing collision sports (like football) is also helpful. Also, avoiding dangerous activities around the house will further help reduce your risk of head injury.  WHEN CAN I RETURN TO NORMAL ACTIVITIES AND ATHLETICS? You should be reevaluated by your health care provider before returning to these activities. If you have any of the following symptoms, you should not return to activities or contact sports until 1 week after the symptoms have stopped:  Persistent headache.  Dizziness or vertigo.  Poor attention and concentration.  Confusion.  Memory problems.  Nausea or vomiting.  Fatigue or tire  easily.  Irritability.  Intolerant of bright lights or loud noises.  Anxiety or depression.  Disturbed sleep. MAKE SURE YOU:   Understand these instructions.  Will watch your condition.  Will get help right away if you are not doing well or get worse. Document Released: 03/14/2005 Document Revised: 03/19/2013 Document Reviewed: 11/19/2012 [facility] Patient Information 2015 [place], [place]. This information is not intended to replace advice given to you by your health care provider. Make sure you discuss any questions you have with your health care provider.

## 2013-10-04 NOTE — ED Notes (Signed)
Per EMS [facility],she was responding to code running down the stairs, when she slipped  and fell forward. EMS reports pt has laceration and swelling to back of her head.No LOC.

## 2013-10-04 NOTE — ED Notes (Signed)
Bed: [facility] Expected date:  Expected time:  Means of arrival:  Comments: EMS-fall 

## 2014-03-28 HISTORY — PX: CARPAL TUNNEL RELEASE: SHX101

## 2014-10-02 ENCOUNTER — Ambulatory Visit (HOSPITAL_COMMUNITY)
Admission: RE | Admit: 2014-10-02 | Discharge: 2014-10-02 | Disposition: A | Discharge status: Home / Self Care | Payer: BLUE CROSS/BLUE SHIELD | Source: Ambulatory Visit | Attending: Physician Assistant | Admitting: Physician Assistant

## 2014-10-02 ENCOUNTER — Other Ambulatory Visit (HOSPITAL_COMMUNITY): Payer: Self-pay | Admitting: Physician Assistant

## 2014-10-02 DIAGNOSIS — M79605 Pain in left leg: Secondary | ICD-10-CM | POA: Insufficient documentation

## 2014-10-02 IMAGING — CR DG TIBIA/FIBULA 2V*L*
2 series · 2 of 2 positions shown · non-contrast
Comparison: None.

CLINICAL DATA: Pain left leg for 1 month. No known injury. Initial
evaluation.

EXAM:
LEFT TIBIA AND FIBULA - 2 VIEW

[view not recorded (1 of 2)]
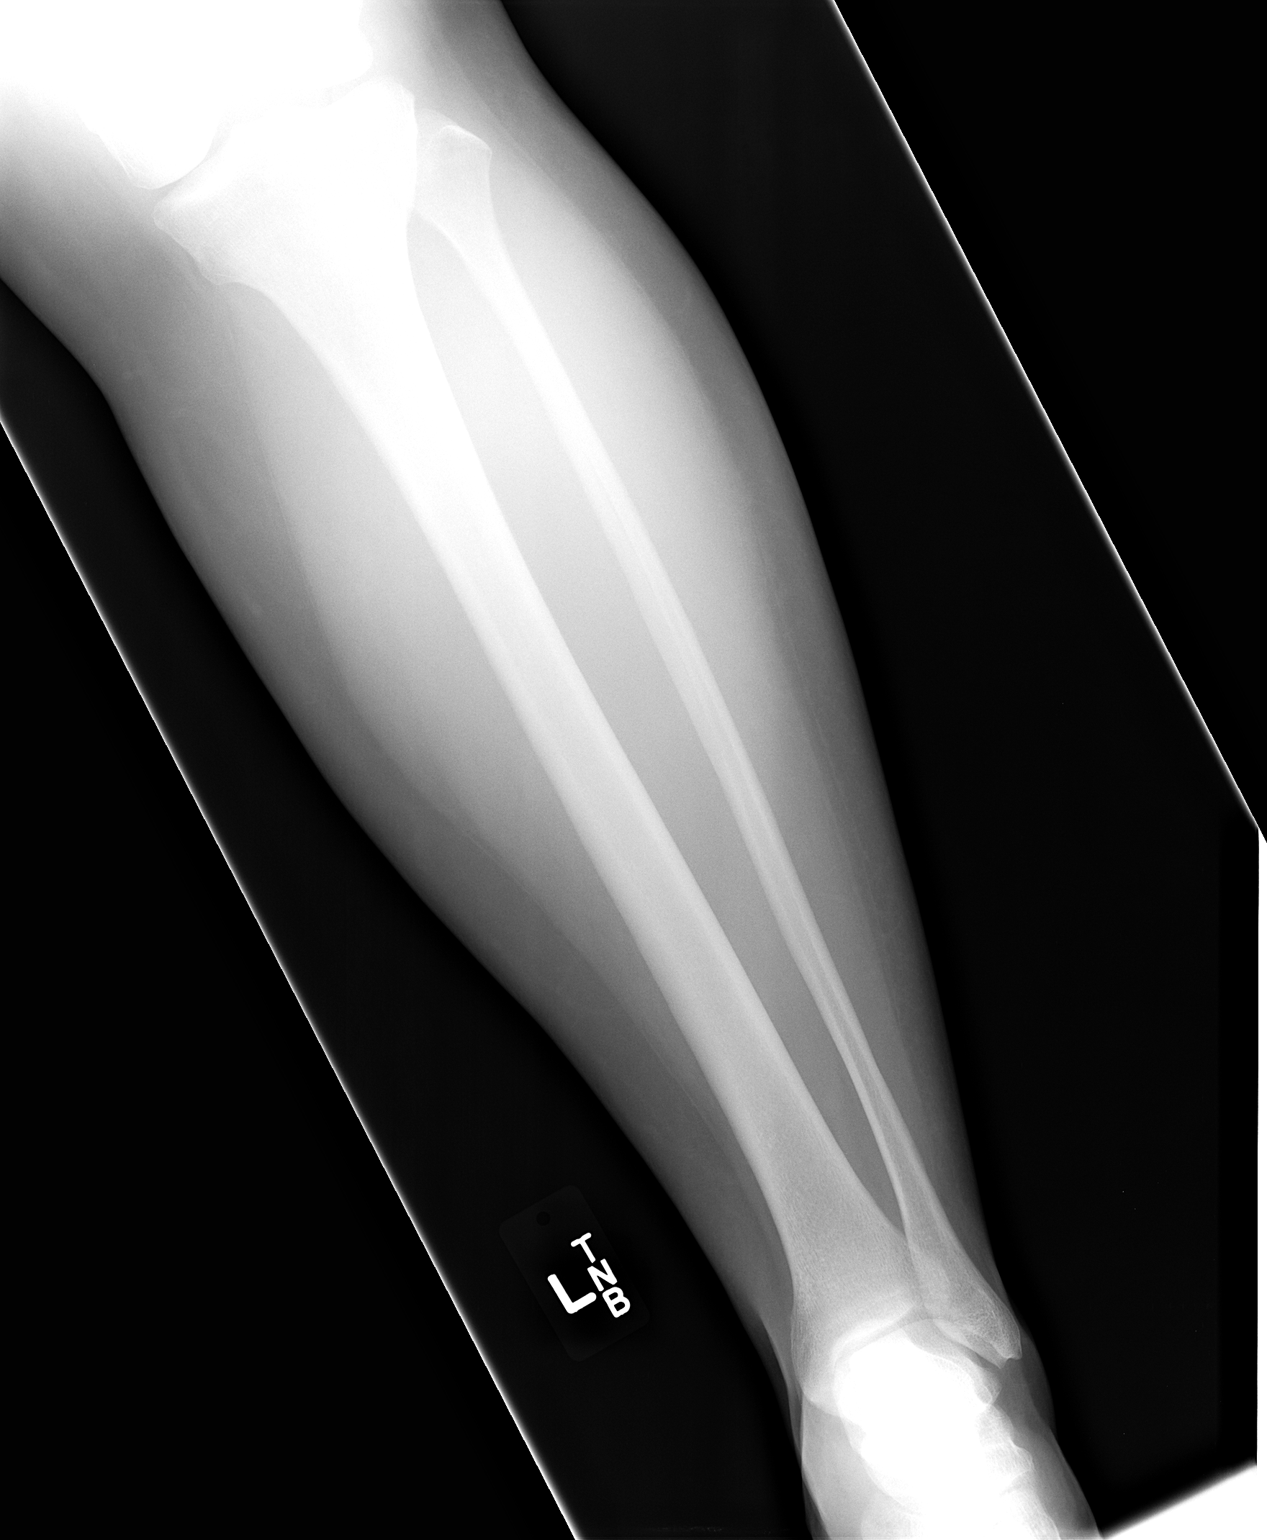

[view not recorded (2 of 2)]
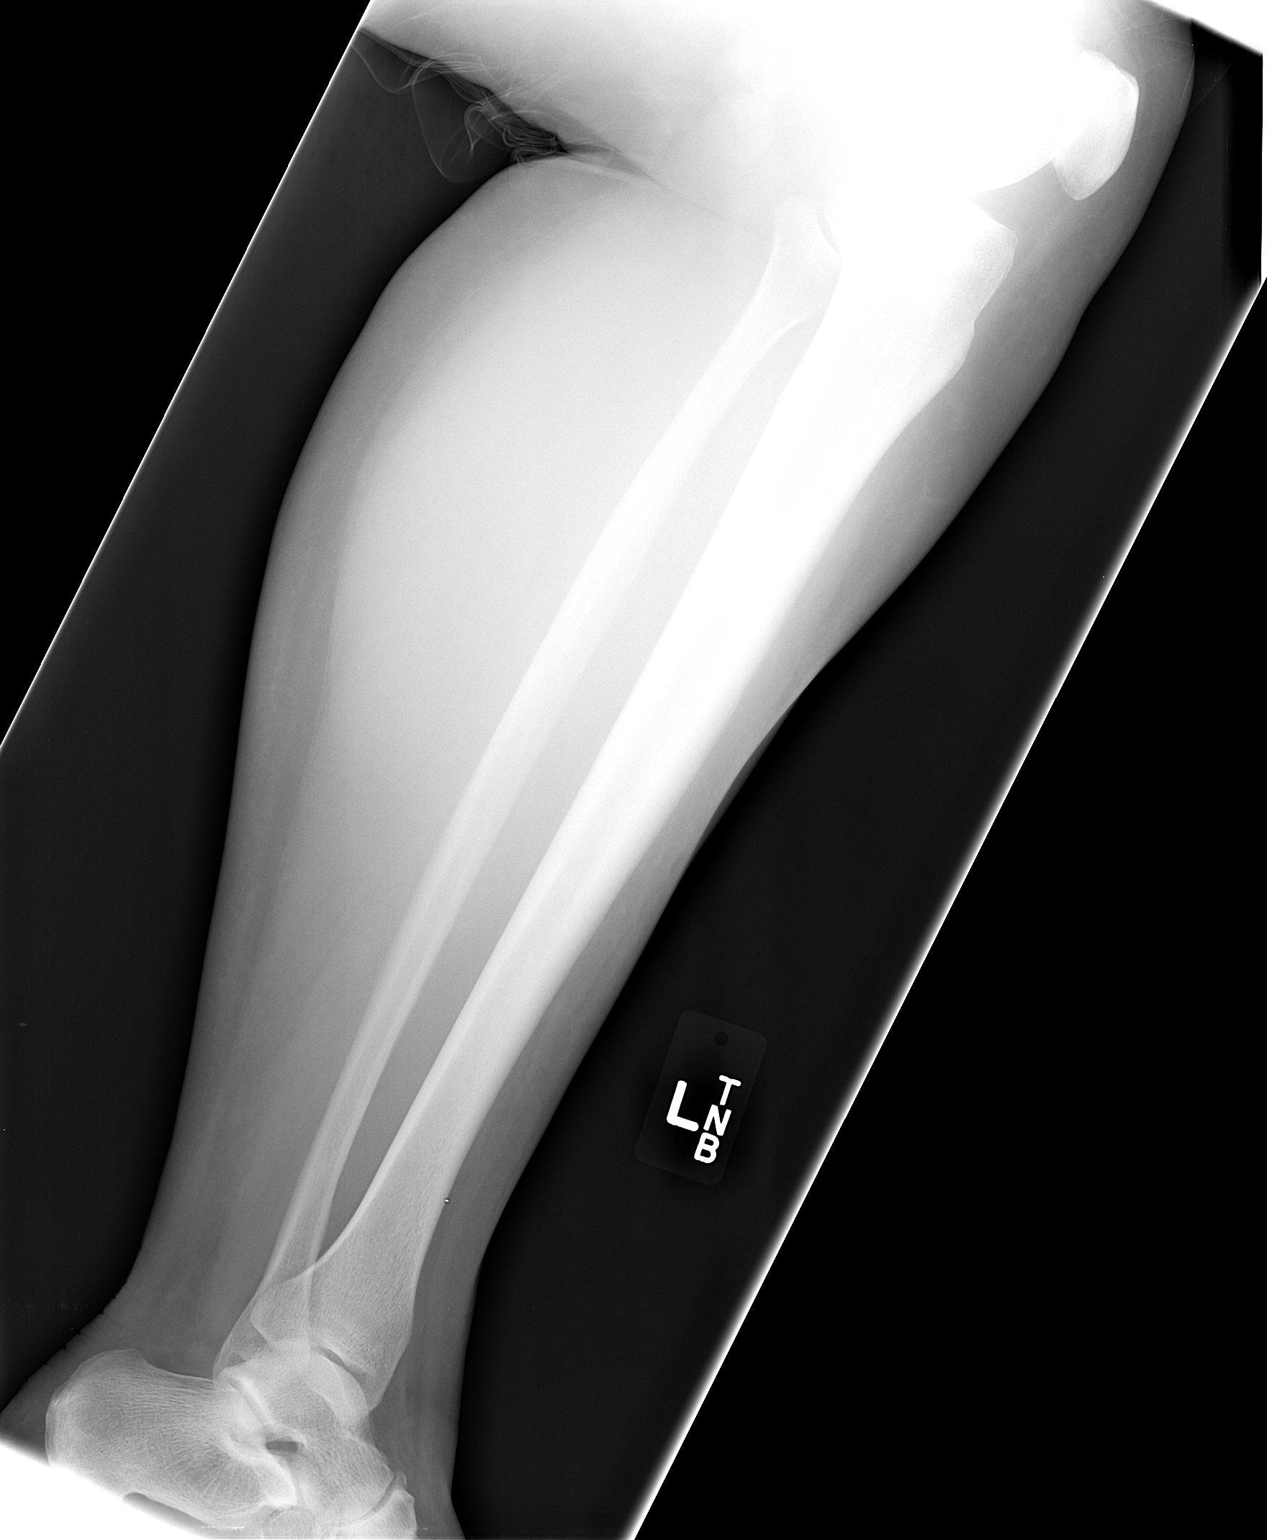

[2 of 2 positions shown; findings below may reference images not displayed]

FINDINGS: No acute bony or joint abnormality identified. No evidence of
fracture or dislocation.
IMPRESSION: Negative exam .

## 2015-11-26 ENCOUNTER — Telehealth: Payer: Self-pay | Admitting: Gastroenterology

## 2015-11-26 ENCOUNTER — Encounter: Payer: Self-pay | Admitting: Gastroenterology

## 2015-11-26 NOTE — Telephone Encounter (Signed)
Per patty, next available appointment.

## 2015-11-26 NOTE — Telephone Encounter (Signed)
Received referral for esophageal stricture. [facility] Dr. Theresia Boughanis--Doc of the Day

## 2016-02-04 ENCOUNTER — Encounter: Payer: Self-pay | Admitting: Gastroenterology

## 2016-02-04 ENCOUNTER — Ambulatory Visit (INDEPENDENT_AMBULATORY_CARE_PROVIDER_SITE_OTHER): Payer: BLUE CROSS/BLUE SHIELD | Admitting: Gastroenterology

## 2016-02-04 VITALS — BP 124/80 | HR 92 | Ht 62.25 in | Wt 242.4 lb

## 2016-02-04 DIAGNOSIS — K449 Diaphragmatic hernia without obstruction or gangrene: Secondary | ICD-10-CM | POA: Diagnosis not present

## 2016-02-04 DIAGNOSIS — R1310 Dysphagia, unspecified: Secondary | ICD-10-CM

## 2016-02-04 DIAGNOSIS — K219 Gastro-esophageal reflux disease without esophagitis: Secondary | ICD-10-CM | POA: Diagnosis not present

## 2016-02-04 DIAGNOSIS — R1319 Other dysphagia: Secondary | ICD-10-CM

## 2016-02-04 NOTE — Progress Notes (Signed)
[facility] Consult Note:  History: Durward MallardSharon Gsell 02/04/2016  Referring physician: Lenise HeraldMANN, BENJAMIN, PA-C  Reason for consult/chief complaint: Dysphagia (with some tightness in esophageal chest area)   Subjective  HPI:  Debbie Goodwin was self-referred for dysphagia. [place]. We were unable to get the reports, Debbie Goodwin recalls being told that the esophagus was just "narrow", and that she has a hiatal hernia. She had good relief of dysphagia until the last 4-6 months. She will have dysphagia mostly to meet, less so to bread. Sometimes, when drinking liquids, she will feel "strangled". She has occasional hoarseness and allergic postnasal drip. She had had an EGD with dilation 3-5 years prior to that one, and again had long lasting relief after dilation. She has chronic pyrosis and regurgitation that is improved on once daily Protonix. She denies early satiety nausea vomiting or weight loss.   ROS:  Review of Systems  Constitutional: Positive for fatigue. Negative for appetite change and unexpected weight change.  HENT: Negative for mouth sores and voice change.   Eyes: Negative for pain and redness.  Respiratory: Negative for cough and shortness of breath.   Cardiovascular: Negative for chest pain and palpitations.  Genitourinary: Negative for dysuria and hematuria.  Musculoskeletal: Positive for arthralgias. Negative for myalgias.  Skin: Negative for pallor and rash.  Neurological: Negative for weakness and headaches.  Hematological: Negative for adenopathy.  Psychiatric/Behavioral: The patient is nervous/anxious.      Past Medical History: Past Medical History:  Diagnosis Date  . Anxiety   . Fibromyalgia   . GERD (gastroesophageal reflux disease)   . HLD (hyperlipidemia)   . Hypertension   . Status post dilation of esophageal narrowing      Past Surgical History: Past Surgical History:  Procedure  Laterality Date  . CARPAL TUNNEL RELEASE Bilateral 2016  . CHOLECYSTECTOMY    . LASIK Bilateral   . PLANTAR FASCIA SURGERY Right 1998     Family History: Family History  Problem Relation Age of Onset  . Hypertension Mother   . Hyperlipidemia Mother   . Colon polyps Father   . Heart attack Father   . Hypertension Father   . Hyperlipidemia Father   . Heart attack Maternal Grandmother   . Stroke Paternal Grandfather     Social History: Social History   Social History  . Marital status: Married    Spouse name: N/A  . Number of children: 0  . Years of education: N/A   Occupational History  . repiratory therapist    Social History Main Topics  . Smoking status: Never Smoker  . Smokeless tobacco: Never Used  . Alcohol use No  . Drug use: No  . Sexual activity: Not Asked   Other Topics Concern  . None   Social History Narrative  . None    Allergies: Allergies  Allergen Reactions  . Sulfa Antibiotics Hives    Outpatient Meds: Current Outpatient Prescriptions  Medication Sig Dispense Refill  . amphetamine-dextroamphetamine (ADDERALL XR) 30 MG 24 hr capsule Take 30 mg by mouth daily.    Marland Kitchen. buPROPion (WELLBUTRIN XL) 300 MG 24 hr tablet Take 300 mg by mouth daily.    Marland Kitchen. FLUoxetine (PROZAC) 40 MG capsule Take 40 mg by mouth daily.    Marland Kitchen. ibuprofen (ADVIL,MOTRIN) 200 MG tablet Take 800 mg by mouth every 6 (six) hours as needed for moderate pain.    Marland Kitchen. losartan (COZAAR) 100 MG tablet Take 100 mg  by mouth daily.    . Omega-3 Fatty Acids (FISH OIL) 300 MG CAPS Take 1 capsule by mouth daily.    . pantoprazole (PROTONIX) 40 MG tablet Take 40 mg by mouth daily.    . polyvinyl alcohol (LIQUIFILM TEARS) 1.4 % ophthalmic solution Place 1 drop into both eyes as needed for dry eyes.    . simvastatin (ZOCOR) 40 MG tablet Take 40 mg by mouth daily.    . traMADol-acetaminophen (ULTRACET) 37.5-325 MG per tablet Take 1 tablet by mouth as needed.      No current facility-administered  medications for this visit.       ___________________________________________________________________ Objective   Exam:  BP 124/80 (BP Location: Left Arm, Patient Position: Sitting, Cuff Size: Large)   Pulse 92   Ht 5' 2.25" (1.581 m) Comment: height measured without shoes  Wt 242 lb 6 oz (109.9 kg)   BMI 43.98 kg/m    General: this is a(n) Obese and otherwise well-appearing middle-aged woman   Eyes: sclera anicteric, no redness  ENT: oral mucosa moist without lesions, no cervical or supraclavicular lymphadenopathy, good dentition  CV: RRR without murmur, S1/S2, no JVD, no peripheral edema  Resp: clear to auscultation bilaterally, normal RR and effort noted  GI: soft, no tenderness, with active bowel sounds. No guarding or palpable organomegaly noted.  Skin; warm and dry, no rash or jaundice noted  Neuro: awake, alert and oriented x 3. Normal gross motor function and fluent speech   Assessment: Encounter Diagnoses  Name Primary?  . Esophageal dysphagia Yes  . Hiatal hernia   . Gastroesophageal reflux disease, esophagitis presence not specified   She probably has a Schatzki ring associated with a hiatal hernia  Plan:  EGD with dilation  The benefits and risks of the planned procedure were described in detail with the patient or (when appropriate) their health care proxy.  Risks were outlined as including, but not limited to, bleeding, infection, perforation, adverse medication reaction leading to cardiac or pulmonary decompensation, or pancreatitis (if ERCP).  The limitation of incomplete mucosal visualization was also discussed.  No guarantees or warranties were given.  Antireflux lifestyle was reinforced.  Thank you for the courtesy of this consult.  Please call me with any questions or concerns.  Charlie PitterHenry L Danis III  CC: MANN, BENJAMIN, PA-C

## 2016-02-04 NOTE — Patient Instructions (Signed)
You have been scheduled for an endoscopy. Please follow written instructions given to you at your visit today. If you use inhalers (even only as needed), please bring them with you on the day of your procedure. Your physician has requested that you go to www.startemmi.com and enter the access code given to you at your visit today. This web site gives a general overview about your procedure. However, you should still follow specific instructions given to you by our office regarding your preparation for the procedure.  If you are age 46 or older, your body mass index should be between 23-30. Your Body mass index is 43.98 kg/m. If this is out of the aforementioned range listed, please consider follow up with your Primary Care Provider.  If you are age 46 or younger, your body mass index should be between 19-25. Your Body mass index is 43.98 kg/m. If this is out of the aformentioned range listed, please consider follow up with your Primary Care Provider.   Thank you for choosing [facility]  Dr Amada JupiterHenry Danis III

## 2016-02-10 ENCOUNTER — Encounter: Payer: Self-pay | Admitting: Gastroenterology

## 2016-02-10 ENCOUNTER — Ambulatory Visit (AMBULATORY_SURGERY_CENTER): Payer: BLUE CROSS/BLUE SHIELD | Admitting: Gastroenterology

## 2016-02-10 VITALS — BP 139/71 | HR 80 | Temp 99.3°F | Resp 20 | Ht 62.75 in | Wt 242.0 lb

## 2016-02-10 DIAGNOSIS — R1319 Other dysphagia: Secondary | ICD-10-CM

## 2016-02-10 DIAGNOSIS — K222 Esophageal obstruction: Secondary | ICD-10-CM

## 2016-02-10 MED ORDER — SODIUM CHLORIDE 0.9 % IV SOLN: 500.0000 mL | INTRAVENOUS | Status: DC

## 2016-02-10 NOTE — Patient Instructions (Addendum)
Impression/Recommendations:  Hiatal hernia handout given to patient. Post esophageal dilation diet given to and reviewed with pt. And care partners. Resume previous diet and continue present medications.  Cut and chew food well.  YOU HAD AN ENDOSCOPIC PROCEDURE TODAY AT THE [facility]:   Refer to the procedure report that was given to you for any specific questions about what was found during the examination.  If the procedure report does not answer your questions, please call your gastroenterologist to clarify.  If you requested that your care partner not be given the details of your procedure findings, then the procedure report has been included in a sealed envelope for you to review at your convenience later.  YOU SHOULD EXPECT: Some feelings of bloating in the abdomen. Passage of more gas than usual.  Walking can help get rid of the air that was put into your GI tract during the procedure and reduce the bloating. If you had a lower endoscopy (such as a colonoscopy or flexible sigmoidoscopy) you may notice spotting of blood in your stool or on the toilet paper. If you underwent a bowel prep for your procedure, you may not have a normal bowel movement for a few days.  Please Note:  You might notice some irritation and congestion in your nose or some drainage.  This is from the oxygen used during your procedure.  There is no need for concern and it should clear up in a day or so.  SYMPTOMS TO REPORT IMMEDIATELY:   Following upper endoscopy (EGD)  Vomiting of blood or coffee ground material  New chest pain or pain under the shoulder blades  Painful or persistently difficult swallowing  New shortness of breath  Fever of 100F or higher  Black, tarry-looking stools  For urgent or emergent issues, a gastroenterologist can be reached at any hour by calling [phone].   DIET:  We do recommend a small meal at first, but then you may proceed to your regular diet.  Drink plenty  of fluids but you should avoid alcoholic beverages for 24 hours.  ACTIVITY:  You should plan to take it easy for the rest of today and you should NOT DRIVE or use heavy machinery until tomorrow (because of the sedation medicines used during the test).    FOLLOW UP: Our staff will call the number listed on your records the next business day following your procedure to check on you and address any questions or concerns that you may have regarding the information given to you following your procedure. If we do not reach you, we will leave a message.  However, if you are feeling well and you are not experiencing any problems, there is no need to return our call.  We will assume that you have returned to your regular daily activities without incident.  If any biopsies were taken you will be contacted by phone or by letter within the next 1-3 weeks.  Please call us at [phone] if you have not heard about the biopsies in 3 weeks.    SIGNATURES/CONFIDENTIALITY: You and/or your care partner have signed paperwork which will be entered into your electronic medical record.  These signatures attest to the fact that that the information above on your After Visit Summary has been reviewed and is understood.  Full responsibility of the confidentiality of this discharge information lies with you and/or your care-partner.

## 2016-02-10 NOTE — Progress Notes (Signed)
A and O x3. Report to RN. Tolerated MAC anesthesia well.Teeth unchanged after procedure. 

## 2016-02-10 NOTE — Progress Notes (Signed)
Called to room to assist during endoscopic procedure.  Patient ID and intended procedure confirmed with present staff. Received instructions for my participation in the procedure from the performing physician.  

## 2016-02-10 NOTE — Op Note (Signed)
[facility] Patient Name: Debbie MallardSharon Goodwin Procedure Date: 02/10/2016 1:18 PM MRN: [id number] Endoscopist: Sherilyn CooterHenry L. Myrtie Neitheranis , MD Age: 45 Referring MD:  Date of Birth: 07/15/1970 Gender: Female Account #: [id number] Procedure:                Upper GI endoscopy Indications:              Dysphagia Medicines:                Monitored Anesthesia Care Procedure:                Pre-Anesthesia Assessment:                           - Prior to the procedure, a History and Physical                            was performed, and patient medications and                            allergies were reviewed. The patient's tolerance of                            previous anesthesia was also reviewed. The risks                            and benefits of the procedure and the sedation                            options and risks were discussed with the patient.                            All questions were answered, and informed consent                            was obtained. Prior Anticoagulants: The patient has                            taken no previous anticoagulant or antiplatelet                            agents. ASA Grade Assessment: II - A patient with                            mild systemic disease. After reviewing the risks                            and benefits, the patient was deemed in                            satisfactory condition to undergo the procedure.                           After obtaining informed consent, the endoscope was  passed under direct vision. Throughout the                            procedure, the patient's blood pressure, pulse, and                            oxygen saturations were monitored continuously. The                            Model GIF-[id number] scope was introduced                            through the mouth, and advanced to the second part                            of duodenum. The upper GI endoscopy was                            accomplished without difficulty. The patient                            tolerated the procedure well. Scope In: Scope Out: Findings:                 A small hiatal hernia was present. It is causing                            mild tortuosity of the distal esophagus.                           A mild Schatzki ring (acquired) was found in the                            lower third of the esophagus. The scope was                            withdrawn after examining the stomach and duodenum.                            Dilation was performed with a Maloney dilator with                            mild resistance at 50 Fr.                           The stomach was normal.                           The cardia and gastric fundus were normal on                            retroflexion.                           The examined duodenum was normal. Complications:  No immediate complications. Estimated Blood Loss:     Estimated blood loss: none. Impression:               - Small hiatal hernia.                           - Mild Schatzki ring. Dilated.                           - Normal stomach.                           - Normal examined duodenum.                           - No specimens collected. Recommendation:           - Patient has a contact number available for                            emergencies. The signs and symptoms of potential                            delayed complications were discussed with the                            patient. Return to normal activities tomorrow.                            Written discharge instructions were provided to the                            patient.                           - Resume previous diet.                           - Continue present medications.                           - Return to GI office as needed.                           Cut and chew food well. Henry L. Myrtie Neitheranis, MD 02/10/2016 1:36:58 PM This report has  been signed electronically.

## 2016-02-11 ENCOUNTER — Telehealth: Payer: Self-pay | Admitting: *Deleted

## 2016-02-11 NOTE — Telephone Encounter (Signed)
  Follow up Call-  Call back number 02/10/2016  Post procedure Call Back phone  # [phone]  Permission to leave phone message Yes  Some recent data might be hidden     Patient questions:  Do you have a fever, pain , or abdominal swelling? No. Pain Score  0 *  Have you tolerated food without any problems? Yes.    Have you been able to return to your normal activities? Yes.    Do you have any questions about your discharge instructions: Diet   No. Medications  No. Follow up visit  No.  Do you have questions or concerns about your Care? No.  Actions: * If pain score is 4 or above: No action needed, pain <4.

## 2016-03-25 ENCOUNTER — Encounter: Payer: Self-pay | Admitting: Gastroenterology

## 2016-09-22 ENCOUNTER — Encounter: Payer: Self-pay | Admitting: General Surgery

## 2016-09-22 ENCOUNTER — Ambulatory Visit (INDEPENDENT_AMBULATORY_CARE_PROVIDER_SITE_OTHER): Payer: 59 | Admitting: General Surgery

## 2016-09-22 VITALS — BP 120/78 | HR 89 | Temp 95.9°F | Resp 18 | Ht 63.0 in | Wt 242.0 lb

## 2016-09-22 DIAGNOSIS — L723 Sebaceous cyst: Secondary | ICD-10-CM | POA: Diagnosis not present

## 2016-09-22 NOTE — Progress Notes (Signed)
Debbie Goodwin; [id number]; 1970-08-20   HPI Patient is a 46 year old white female who was referred to my care by dermatology and Dr. Sherwood Gambler for evaluation treatment of a lump on her left buttock. It is been present for approximately 2 years. When it swells, it causes pain. Currently, it is not swollen. She has had no drainage from that. No current fever or chills. Past Medical History:  Diagnosis Date  . Anxiety   . Fibromyalgia   . GERD (gastroesophageal reflux disease)   . HLD (hyperlipidemia)   . Hypertension   . Status post dilation of esophageal narrowing     Past Surgical History:  Procedure Laterality Date  . CARPAL TUNNEL RELEASE Bilateral 2016  . CHOLECYSTECTOMY    . LASIK Bilateral   . PLANTAR FASCIA SURGERY Right 1998    Family History  Problem Relation Age of Onset  . Hypertension Mother   . Hyperlipidemia Mother   . Colon polyps Father   . Heart attack Father   . Hypertension Father   . Hyperlipidemia Father   . Heart attack Maternal Grandmother   . Stroke Paternal Grandfather     Current Outpatient Prescriptions on File Prior to Visit  Medication Sig Dispense Refill  . amphetamine-dextroamphetamine (ADDERALL XR) 30 MG 24 hr capsule Take 30 mg by mouth daily.    Marland Kitchen buPROPion (WELLBUTRIN XL) 300 MG 24 hr tablet Take 300 mg by mouth daily.    Marland Kitchen FLUoxetine (PROZAC) 40 MG capsule Take 40 mg by mouth daily.    Marland Kitchen ibuprofen (ADVIL,MOTRIN) 200 MG tablet Take 800 mg by mouth every 6 (six) hours as needed for moderate pain.    Marland Kitchen losartan (COZAAR) 100 MG tablet Take 100 mg by mouth daily.    . Omega-3 Fatty Acids (FISH OIL) 300 MG CAPS Take 1 capsule by mouth daily.    . pantoprazole (PROTONIX) 40 MG tablet Take 40 mg by mouth daily.    . polyvinyl alcohol (LIQUIFILM TEARS) 1.4 % ophthalmic solution Place 1 drop into both eyes as needed for dry eyes.    . simvastatin (ZOCOR) 40 MG tablet Take 40 mg by mouth daily.    . traMADol-acetaminophen (ULTRACET) 37.5-325 MG per  tablet Take 1 tablet by mouth as needed.      Current Facility-Administered Medications on File Prior to Visit  Medication Dose Route Frequency Provider Last Rate Last Dose  . 0.9 %  sodium chloride infusion  500 mL Intravenous Continuous Sherrilyn Rist, MD        Allergies  Allergen Reactions  . Sulfa Antibiotics Hives    History  Alcohol Use No    History  Smoking Status  . Never Smoker  Smokeless Tobacco  . Never Used    Review of Systems  Constitutional: Negative.   HENT: Negative.   Eyes: Negative.   Respiratory: Negative.   Cardiovascular: Negative.   Gastrointestinal: Positive for heartburn.  Genitourinary: Negative.   Musculoskeletal: Positive for back pain, joint pain and neck pain.  Skin: Negative.   Neurological: Negative.   Endo/Heme/Allergies: Negative.   Psychiatric/Behavioral: Negative.     Objective   Vitals:   09/22/16 1036  BP: 120/78  Pulse: 89  Resp: 18  Temp: (!) 95.9 F (35.5 C)    Physical Exam  Constitutional: She is oriented to person, place, and time and well-developed, well-nourished, and in no distress. No distress.  HENT:  Head: Normocephalic and atraumatic.  Cardiovascular: Normal rate, regular rhythm and normal heart sounds.  No murmur heard. Pulmonary/Chest: Effort normal and breath sounds normal. She has no wheezes.  Neurological: She is alert and oriented to person, place, and time.  Skin: Skin is warm and dry.  2 cm ovoid subcutaneous hard nodule present below the skin in the midportion of the left buttock. No erythema or drainage noted. It is tender to touch due to pressure.  Vitals reviewed.   Assessment  Sebaceous cyst, left buttock Plan   I offered excision of the cyst, but patient would like to think about it for right now. She will call me to schedule the surgery should she desire. Follow-up as needed.

## 2016-09-22 NOTE — Patient Instructions (Signed)
Epidermal Cyst An epidermal cyst is sometimes called an epidermal inclusion cyst or an infundibular cyst. It is a sac made of skin tissue. The sac contains a substance called keratin. Keratin is a protein that is normally secreted through the hair follicles. When keratin becomes trapped in the top layer of skin (epidermis), it can form an epidermal cyst. Epidermal cysts are usually found on the face, neck, trunk, and genitals. These cysts are usually harmless (benign), and they may not cause symptoms unless they become infected. It is important not to pop epidermal cysts yourself. What are the causes? This condition may be caused by:  A blocked hair follicle.  A hair that curls and re-enters the skin instead of growing straight out of the skin (ingrown hair).  A blocked pore.  Irritated skin.  An injury to the skin.  Certain conditions that are passed along from parent to child (inherited).  Human papillomavirus (HPV).  What increases the risk? The following factors may make you more likely to develop an epidermal cyst:  Having acne.  Being overweight.  Wearing tight clothing.  What are the signs or symptoms? The only symptom of this condition may be a small, painless lump underneath the skin. When an epidermal cyst becomes infected, symptoms may include:  Redness.  Inflammation.  Tenderness.  Warmth.  Fever.  Keratin draining from the cyst. Keratin may look like a grayish-white, bad-smelling substance.  Pus draining from the cyst.  How is this diagnosed? This condition is diagnosed with a physical exam. In some cases, you may have a sample of tissue (biopsy) taken from your cyst to be examined under a microscope or tested for bacteria. You may be referred to a health care provider who specializes in skin care (dermatologist). How is this treated? In many cases, epidermal cysts go away on their own without treatment. If a cyst becomes infected, treatment may  include:  Opening and draining the cyst. After draining, minor surgery to remove the rest of the cyst may be done.  Antibiotic medicine to help prevent infection.  Injections of medicines (steroids) that help to reduce inflammation.  Surgery to remove the cyst. Surgery may be done if: ? The cyst becomes large. ? The cyst bothers you. ? There is a chance that the cyst could turn into cancer.  Follow these instructions at home:  Take over-the-counter and prescription medicines only as told by your health care provider.  If you were prescribed an antibiotic, use it as told by your health care provider. Do not stop using the antibiotic even if you start to feel better.  Keep the area around your cyst clean and dry.  Wear loose, dry clothing.  Do not try to pop your cyst.  Avoid touching your cyst.  Check your cyst every day for signs of infection.  Keep all follow-up visits as told by your health care provider. This is important. How is this prevented?  Wear clean, dry, clothing.  Avoid wearing tight clothing.  Keep your skin clean and dry. Shower or take baths every day.  Wash your body with a benzoyl peroxide wash when you shower or bathe. Contact a health care provider if:  Your cyst develops symptoms of infection.  Your condition is not improving or is getting worse.  You develop a cyst that looks different from other cysts you have had.  You have a fever. Get help right away if:  Redness spreads from the cyst into the surrounding area. This information is   not intended to replace advice given to you by your health care provider. Make sure you discuss any questions you have with your health care provider. Document Released: 02/13/2004 Document Revised: 11/11/2015 Document Reviewed: 01/14/2015 Elsevier Interactive Patient Education  2018 [employer]vier Inc.  

## 2020-06-10 ENCOUNTER — Ambulatory Visit: Payer: BLUE CROSS/BLUE SHIELD | Admitting: Nurse Practitioner

## 2020-06-25 ENCOUNTER — Encounter: Payer: Self-pay | Admitting: Nurse Practitioner

## 2020-06-25 ENCOUNTER — Ambulatory Visit (INDEPENDENT_AMBULATORY_CARE_PROVIDER_SITE_OTHER): Payer: Commercial Managed Care - PPO | Admitting: Nurse Practitioner

## 2020-06-25 VITALS — BP 126/74 | HR 91 | Ht 63.0 in | Wt 271.0 lb

## 2020-06-25 DIAGNOSIS — K219 Gastro-esophageal reflux disease without esophagitis: Secondary | ICD-10-CM | POA: Diagnosis not present

## 2020-06-25 DIAGNOSIS — Z1211 Encounter for screening for malignant neoplasm of colon: Secondary | ICD-10-CM

## 2020-06-25 MED ORDER — PLENVU 140 G PO SOLR: ORAL | 0 refills | Status: DC

## 2020-06-25 MED ORDER — FAMOTIDINE 20 MG PO TABS: 20.0000 mg | ORAL_TABLET | Freq: Every day | ORAL | 3 refills | Status: DC

## 2020-06-25 NOTE — Patient Instructions (Signed)
We have sent Pepcid to your pharmacy  You have been scheduled for an endoscopy and colonoscopy. Please follow the written instructions given to you at your visit today. Please pick up your prep supplies at the pharmacy within the next 1-3 days. If you use inhalers (even only as needed), please bring them with you on the day of your procedure.   Due to recent changes in healthcare laws, you may see the results of your imaging and laboratory studies on MyChart before your provider has had a chance to review them.  We understand that in some cases there may be results that are confusing or concerning to you. Not all laboratory results come back in the same time frame and the provider may be waiting for multiple results in order to interpret others.  Please give [place] 48 hours in order for your provider to thoroughly review all the results before contacting the office for clarification of your results.   If you are age 30 or older, your body mass index should be between 23-30. Your Body mass index is 48.01 kg/m. If this is out of the aforementioned range listed, please consider follow up with your Primary Care Provider.  If you are age 35 or younger, your body mass index should be between 19-25. Your Body mass index is 48.01 kg/m. If this is out of the aformentioned range listed, please consider follow up with your Primary Care Provider.    I appreciate the  opportunity to care for you  Thank You   Midge Minium

## 2020-06-25 NOTE — Progress Notes (Signed)
ASSESSMENT AND PLAN    # Colon cancer screening . No alarm features.  --.Patient will be scheduled for a colonoscopy. The risks and benefits of colonoscopy with possible polypectomy / biopsies were discussed and the patient agrees to proceed.   # Obesity. Takes Phenteramine --Hold Phenteramine for colonoscopy   # Chronic GERD. For the most part she did well on daily PPI but over last several month she has been having nocturnal regurgitation.  --Anti-reflux measures discussed (avoid soda and coffee, elevate HOH, etc) --If unable to elevated HOB recommend wedge pillow --EGD for evaluation of breakthrough GERD symptoms. The risks and benefits of EGD were discussed and the patient agrees to proceed.  --Continue daily PPI. Add famotidine 20 mg Q HS  # Depression, controlled on Wellbutrin and Prozac.   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 Loletha Carrow for evaluation of dysphagia.  Subsequent EGD showed a small hiatal hernia and a Schatzki's ring which was dilated. Patient has not had any recurrent dysphagia.  She is now referred by PCP for colon cancer screening. No Garber of colon cancer. She has no bowel changes, blood in stool or other alarm features.    Patient takes a daily PPI for chronic GERD.  She did well for years but over the last several months has been having nocturnal regurgitation .  She does feel like coffee and soda makes her symptoms worse.  She sleeps on 2 pillows.  Patient also gives a history of esophageal spasms occurring a couple of times of year. Spasms seems to be related to flare in GERD symptoms.  Dr. Earley Brooke in [place]  used to give her SL nitroglycerin which worked well.    PREVIOUS EVALUATIONS:    2017 EGD for dysphagia --small hiatal hernia was present. It is causing mild tortuosity of the distal esophagus. - A mild Schatzki ring (acquired) was found in the lower third of the esophagus. Dilation was performed with a Maloney dilator with mild resistance at 57 Fr. - The stomach was normal. - The cardia and gastric fundus were normal on retroflexion. - The examined duodenum was normal.  Past Medical History:  Diagnosis Date  . Anxiety   . Fibromyalgia   . GERD (gastroesophageal reflux disease)   . HLD (hyperlipidemia)   . Hypertension   . Status post dilation of esophageal narrowing      Past Surgical History:  Procedure Laterality Date  . CARPAL TUNNEL RELEASE Bilateral 2016  . CHOLECYSTECTOMY    . LASIK Bilateral   . PLANTAR FASCIA SURGERY Right 1998   Family History  Problem Relation Age of Onset  . Hypertension Mother   . Hyperlipidemia Mother   . Colon polyps Father   . Heart attack Father   . Hypertension Father   . Hyperlipidemia Father   . Heart attack Maternal Grandmother   . Stroke Paternal Grandfather    Social History   Tobacco Use  . Smoking status: Never Smoker  . Smokeless tobacco: Never Used  Substance Use Topics  . Alcohol use: No  . Drug use: No   Current Outpatient Medications  Medication Sig Dispense Refill  . buPROPion (WELLBUTRIN XL)  300 MG 24 hr tablet Take 300 mg by mouth daily.    . famotidine (PEPCID) 20 MG tablet Take 1 tablet (20 mg total) by mouth at bedtime. 30 tablet 3  . FLUoxetine (PROZAC) 20 MG capsule Take 60 mg by mouth daily.    Marland Kitchen ibuprofen (ADVIL,MOTRIN) 200 MG tablet Take 800 mg by mouth every 6 (six) hours as needed for moderate pain.    Marland Kitchen levothyroxine (SYNTHROID) 100 MCG tablet Take 100 mcg by mouth daily.    Marland Kitchen olmesartan (BENICAR) 40 MG tablet Take 40 mg by mouth daily.    . Omega-3 Fatty Acids (FISH OIL) 300 MG CAPS Take 1 capsule  by mouth daily.    Marland Kitchen omeprazole (PRILOSEC) 40 MG capsule Take 40 mg by mouth daily.    Marland Kitchen PEG-KCl-NaCl-NaSulf-Na Asc-C (PLENVU) 140 g SOLR ! Prep kit for colonoscopy 1 each 0  . phentermine 37.5 MG capsule Take 37.5 mg by mouth daily.    . polyvinyl alcohol (LIQUIFILM TEARS) 1.4 % ophthalmic solution Place 1 drop into both eyes as needed for dry eyes.    Marland Kitchen rOPINIRole (REQUIP) 0.5 MG tablet Take 0.5 mg by mouth 3 (three) times daily.    . simvastatin (ZOCOR) 40 MG tablet Take 40 mg by mouth daily.     Current Facility-Administered Medications  Medication Dose Route Frequency Provider Last Rate Last Admin  . 0.9 %  sodium chloride infusion  500 mL Intravenous Continuous Doran Stabler, MD       Allergies  Allergen Reactions  . Sulfa Antibiotics Hives     Review of Systems: Positive for depression, fatigue, night sweats.  All other systems reviewed and negative except where noted in HPI.   PHYSICAL EXAM :    Wt Readings from Last 3 Encounters:  09/22/16 242 lb (109.8 kg)  02/10/16 242 lb (109.8 kg)  02/04/16 242 lb 6 oz (109.9 kg)    BP 126/74   Pulse 91   Ht 5' 3"  (1.6 m)   Wt 271 lb (122.9 kg)   BMI 48.01 kg/m  Constitutional:  Pleasant obese female in no acute distress. Psychiatric: Normal mood and affect. Behavior is normal. EENT: Pupils normal.  Conjunctivae are normal. No scleral icterus. Neck supple.  Cardiovascular: Normal rate, regular rhythm. No edema Pulmonary/chest: Effort normal and breath sounds normal. No wheezing, rales or rhonchi. Abdominal: Soft, nondistended, nontender. Bowel sounds active throughout. There are no masses palpable. No hepatomegaly. Neurological: Alert and oriented to person place and time. Skin: Skin is warm and dry. No rashes noted.  Tye Savoy, NP  06/25/2020, 8:34 AM  Cc:  Referring Provider Sharilyn Sites, MD

## 2020-06-26 NOTE — Progress Notes (Signed)
____________________________________________________________  Attending physician addendum:  Thank you for sending this case to me. I have reviewed the entire note and agree with the plan.   Jonus Coble Danis, MD  ____________________________________________________________  

## 2020-07-14 ENCOUNTER — Other Ambulatory Visit: Payer: Self-pay

## 2020-07-14 ENCOUNTER — Other Ambulatory Visit (HOSPITAL_COMMUNITY): Payer: Self-pay | Admitting: Family Medicine

## 2020-07-14 ENCOUNTER — Ambulatory Visit (HOSPITAL_COMMUNITY)
Admission: RE | Admit: 2020-07-14 | Discharge: 2020-07-14 | Disposition: A | Discharge status: Home / Self Care | Payer: Commercial Managed Care - PPO | Source: Ambulatory Visit | Attending: Family Medicine | Admitting: Family Medicine

## 2020-07-14 DIAGNOSIS — M25552 Pain in left hip: Secondary | ICD-10-CM | POA: Diagnosis present

## 2020-07-14 IMAGING — DX DG HIP (WITH OR WITHOUT PELVIS) 2-3V*L*
3 series · 3 of 3 positions shown · non-contrast
Comparison: None.

CLINICAL DATA: Left hip pain for 1 week, no known injury

EXAM:
DG HIP (WITH OR WITHOUT PELVIS) 2-3V LEFT

[pelvis ap]
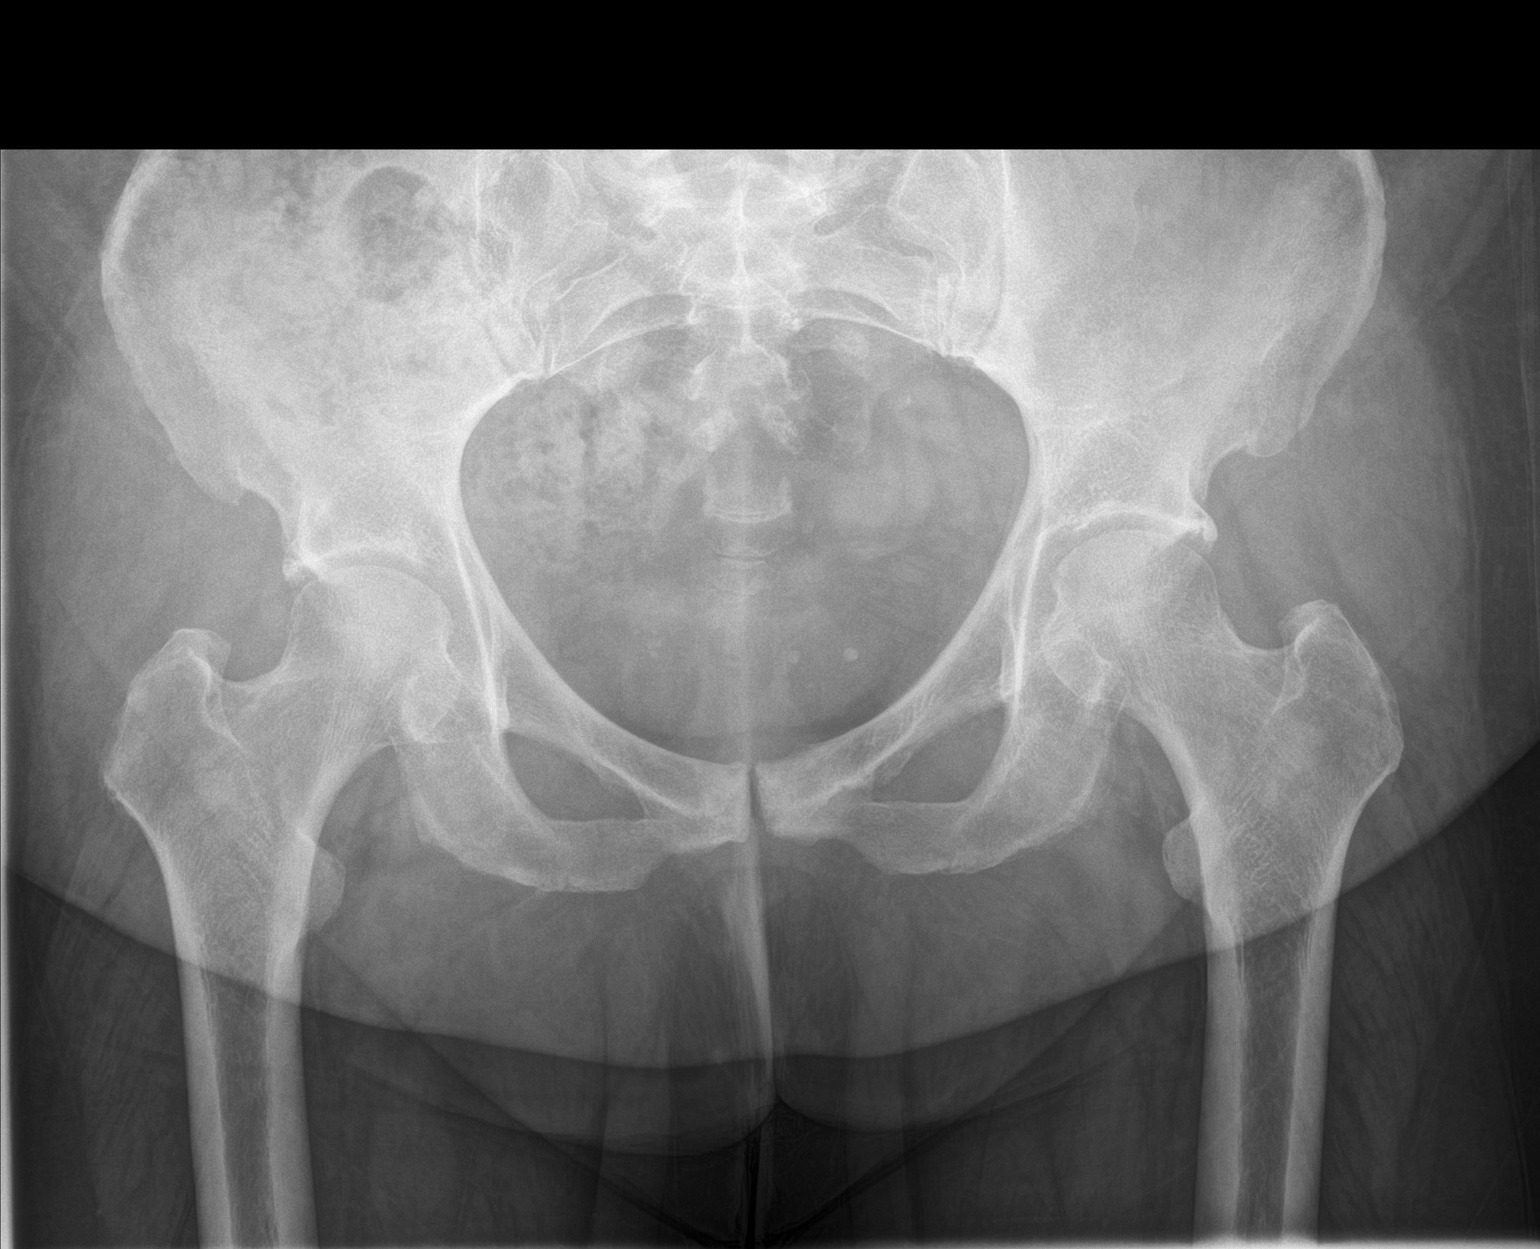

[hip ap]
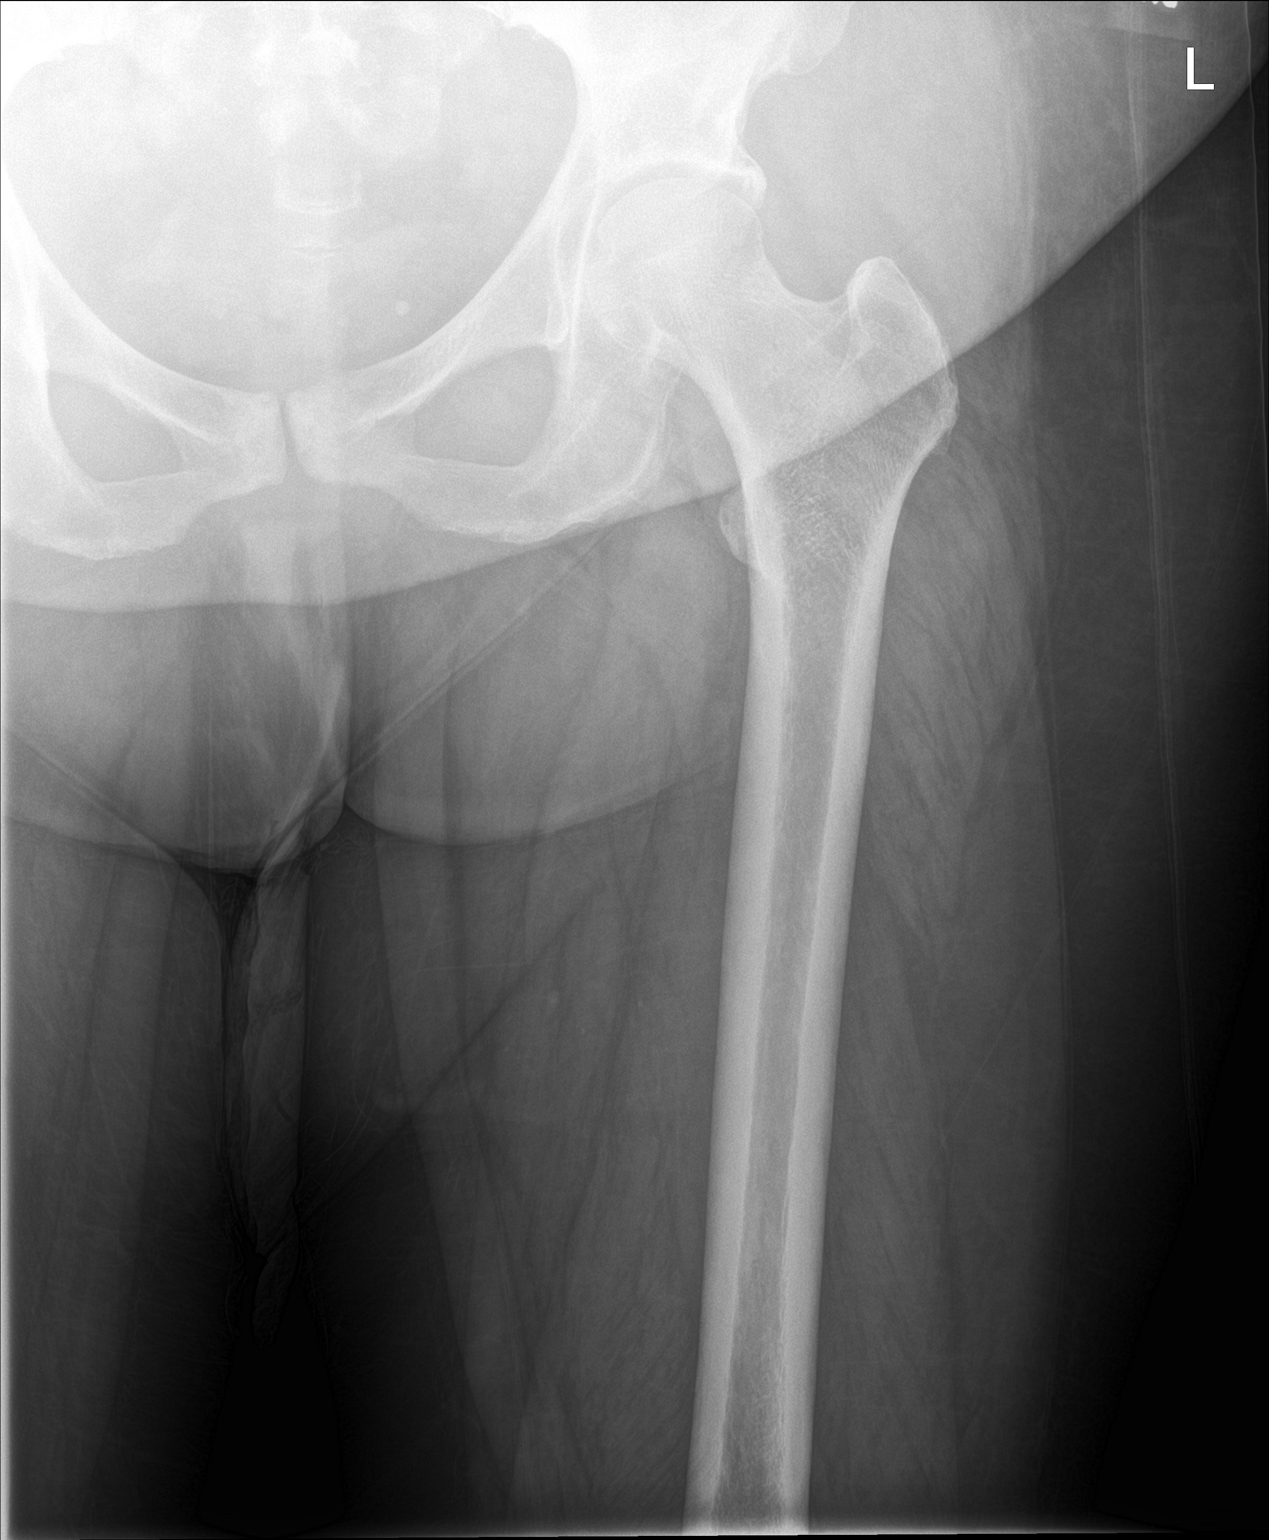

[hip lat]
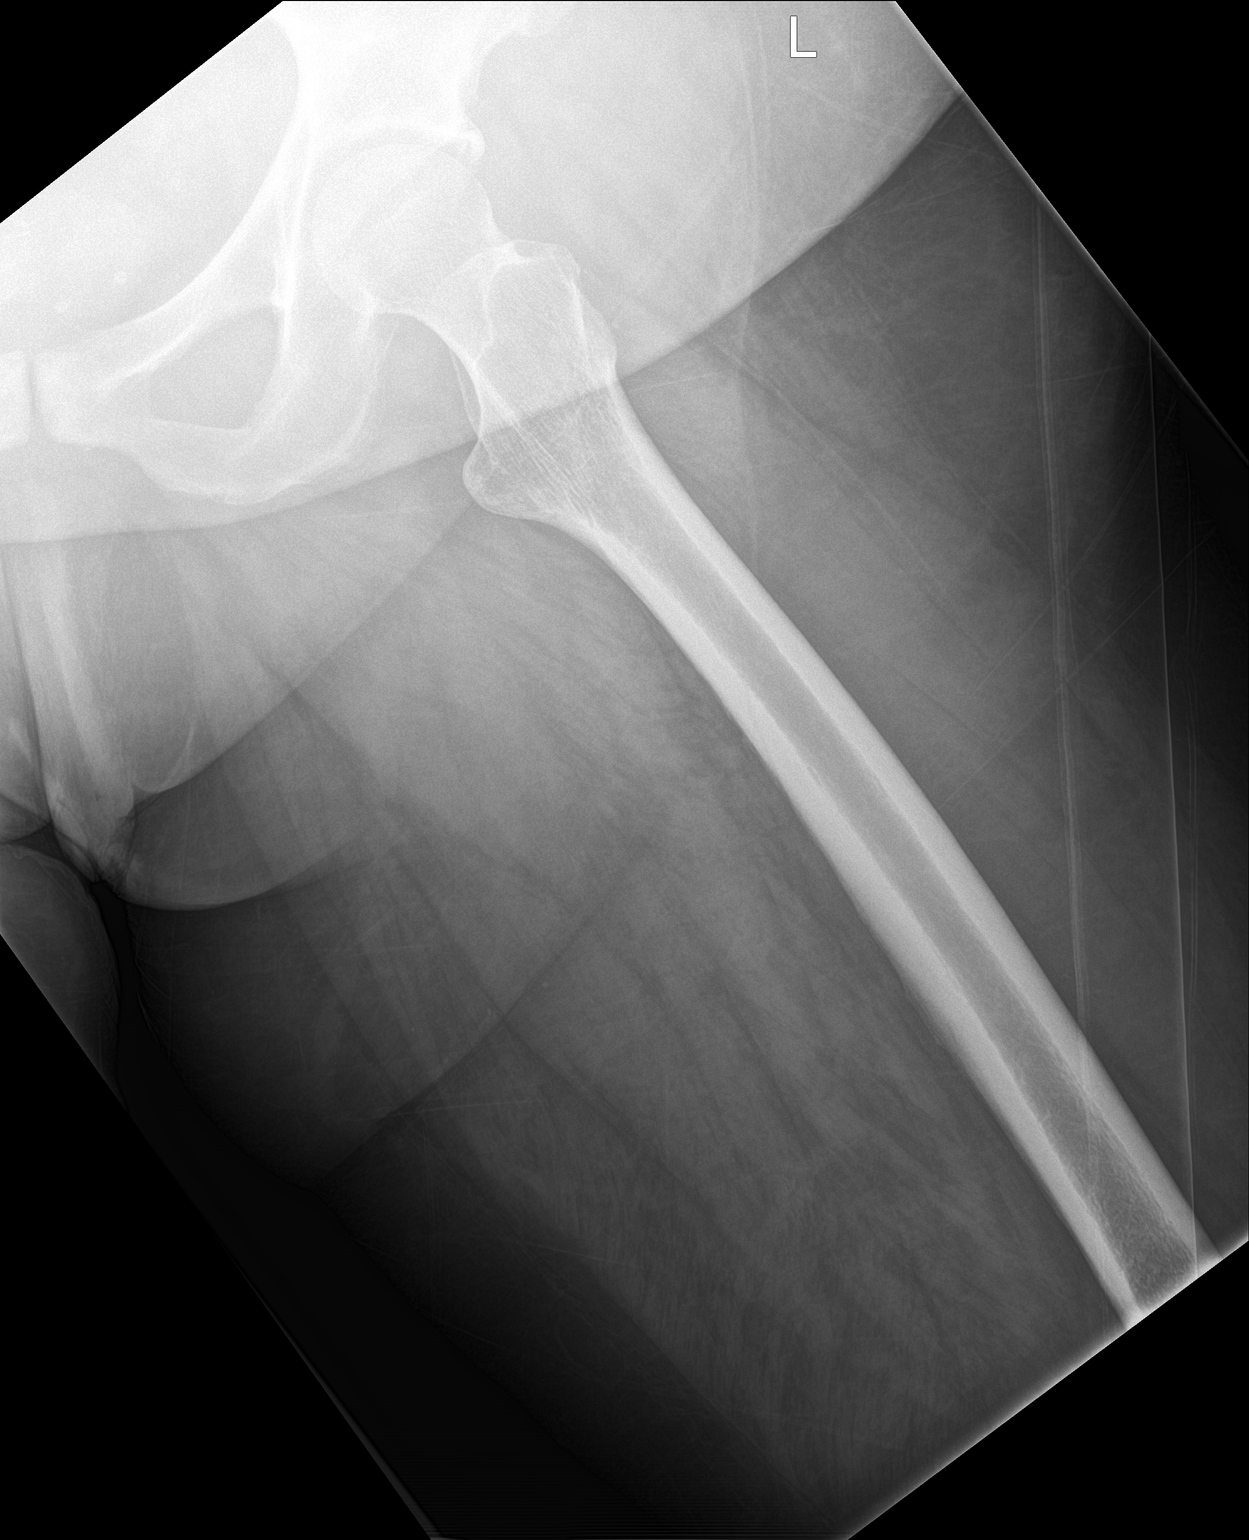

[3 of 3 positions shown; findings below may reference images not displayed]

FINDINGS: The bones of the pelvis are intact and congruent. Proximal femora
intact and normally located. Moderate bilateral hip osteoarthrosis
with periacetabular spurring. Additional minimal degenerative change
at the SI joints. Normal bone mineralization. No worrisome lytic or
blastic lesions. No acute or worrisome soft tissue abnormalities.
Few phleboliths in the pelvis. Normal bowel gas pattern.
IMPRESSION: Moderate bilateral hip osteoarthrosis.

No acute osseous abnormality.

## 2020-09-07 ENCOUNTER — Encounter: Payer: Commercial Managed Care - PPO | Admitting: Gastroenterology

## 2020-09-15 ENCOUNTER — Encounter: Payer: Self-pay | Admitting: Gastroenterology

## 2020-09-17 ENCOUNTER — Encounter: Payer: Self-pay | Admitting: Certified Registered Nurse Anesthetist

## 2020-09-18 ENCOUNTER — Ambulatory Visit (AMBULATORY_SURGERY_CENTER): Payer: Commercial Managed Care - PPO | Admitting: Gastroenterology

## 2020-09-18 ENCOUNTER — Other Ambulatory Visit: Payer: Self-pay

## 2020-09-18 ENCOUNTER — Encounter: Payer: Self-pay | Admitting: Gastroenterology

## 2020-09-18 VITALS — BP 111/60 | HR 87 | Temp 98.0°F | Resp 16 | Ht 63.0 in | Wt 271.0 lb

## 2020-09-18 DIAGNOSIS — Z1211 Encounter for screening for malignant neoplasm of colon: Secondary | ICD-10-CM

## 2020-09-18 DIAGNOSIS — K219 Gastro-esophageal reflux disease without esophagitis: Secondary | ICD-10-CM | POA: Diagnosis not present

## 2020-09-18 MED ORDER — SODIUM CHLORIDE 0.9 % IV SOLN: 500.0000 mL | Freq: Once | INTRAVENOUS | Status: DC

## 2020-09-18 NOTE — Op Note (Signed)
[facility] Patient Name: Debbie Goodwin Procedure Date: 09/18/2020 7:45 AM MRN: [id number] Endoscopist: Debbie Goodwin , MD Age: 50 Referring MD:  Date of Birth: Apr 03, 1970 Gender: Female Account #: [id number] Procedure:                Upper GI endoscopy Indications:              Esophageal reflux symptoms that persist despite                            appropriate therapy Medicines:                Monitored Anesthesia Care Procedure:                Pre-Anesthesia Assessment:                           - Prior to the procedure, a History and Physical                            was performed, and patient medications and                            allergies were reviewed. The patient's tolerance of                            previous anesthesia was also reviewed. The risks                            and benefits of the procedure and the sedation                            options and risks were discussed with the patient.                            All questions were answered, and informed consent                            was obtained. Prior Anticoagulants: The patient has                            taken no previous anticoagulant or antiplatelet                            agents. ASA Grade Assessment: III - A patient with                            severe systemic disease. After reviewing the risks                            and benefits, the patient was deemed in                            satisfactory condition to undergo the procedure.  After obtaining informed consent, the endoscope was                            passed under direct vision. Throughout the                            procedure, the patient's blood pressure, pulse, and                            oxygen saturations were monitored continuously. The                            GIF [id number] #[id number] was introduced through the                            mouth, and advanced to the second  part of duodenum.                            The upper GI endoscopy was accomplished without                            difficulty. The patient tolerated the procedure                            (required chin lift/jaw thrust throughout EGHD and                            colon). Scope In: Scope Out: Findings:                 A small (1-2 cm) sliding hiatal hernia was present.                           A widely patent Schatzki ring was found at the                            gastroesophageal junction.                           There is no endoscopic evidence of Barrett's                            esophagus, esophagitis or stricture in the entire                            esophagus.                           The stomach was normal.                           The cardia and gastric fundus were normal on                            retroflexion.  The examined duodenum was normal. Complications:            No immediate complications. Estimated Blood Loss:     Estimated blood loss: none. Impression:               - A 1-2 cm hiatal hernia.                           - Widely patent Schatzki ring.                           - Normal stomach.                           - Normal examined duodenum.                           - No specimens collected. Recommendation:           - Patient has a contact number available for                            emergencies. The signs and symptoms of potential                            delayed complications were discussed with the                            patient. Return to normal activities tomorrow.                            Written discharge instructions were provided to the                            patient.                           - Resume previous diet.                           - Continue present medications.                           - Follow an antireflux regimen indefinitely.                           - Weight loss would  most likely lead to significant                            improvement in GERD symptoms.                           Observations during these procedures suggest                            patient has OSA - recommend discussion with PCP re:  sleep testing. Debbie Kohli L. Myrtie Neitheranis, MD 09/18/2020 8:33:19 AM This report has been signed electronically.

## 2020-09-18 NOTE — Progress Notes (Signed)
Report given to PACU, vss 

## 2020-09-18 NOTE — Progress Notes (Signed)
[facility]  Pt's states no medical or surgical changes since previsit or office visit.    Last period in May. Denies possiblity of pregnancy.

## 2020-09-18 NOTE — Op Note (Signed)
[facility] Patient Name: Debbie Goodwin Procedure Date: 09/18/2020 7:45 AM MRN: [id number] Endoscopist: Sherilyn Cooter L. Myrtie Neither , MD Age: 50 Referring MD:  Date of Birth: 1970-08-26 Gender: Female Account #: [id number] Procedure:                Colonoscopy Indications:              Screening for colorectal malignant neoplasm, This                            is the patient's first colonoscopy Medicines:                Monitored Anesthesia Care Procedure:                Pre-Anesthesia Assessment:                           - Prior to the procedure, a History and Physical                            was performed, and patient medications and                            allergies were reviewed. The patient's tolerance of                            previous anesthesia was also reviewed. The risks                            and benefits of the procedure and the sedation                            options and risks were discussed with the patient.                            All questions were answered, and informed consent                            was obtained. Prior Anticoagulants: The patient has                            taken no previous anticoagulant or antiplatelet                            agents. ASA Grade Assessment: III - A patient with                            severe systemic disease. After reviewing the risks                            and benefits, the patient was deemed in                            satisfactory condition to undergo the procedure.  After obtaining informed consent, the colonoscope                            was passed under direct vision. Throughout the                            procedure, the patient's blood pressure, pulse, and                            oxygen saturations were monitored continuously. The                            CF [id number] #[id number] was introduced through the anus                            and advanced to the  the cecum, identified by                            appendiceal orifice and ileocecal valve. The                            colonoscopy was performed without difficulty. The                            patient tolerated the procedure. The quality of the                            bowel preparation was excellent. The ileocecal                            valve, appendiceal orifice, and rectum were                            photographed. Scope In: 8:08:03 AM Scope Out: 8:19:31 AM Scope Withdrawal Time: 0 hours 8 minutes 26 seconds  Total Procedure Duration: 0 hours 11 minutes 28 seconds  Findings:                 The perianal and digital rectal examinations were                            normal.                           A few small-mouthed diverticula were found in the                            sigmoid colon.                           The exam was otherwise without abnormality on                            direct and retroflexion views. Complications:            No immediate complications. Estimated Blood Loss:  Estimated blood loss: none. Impression:               - Diverticulosis in the sigmoid colon.                           - The examination was otherwise normal on direct                            and retroflexion views.                           - No specimens collected. Recommendation:           - Patient has a contact number available for                            emergencies. The signs and symptoms of potential                            delayed complications were discussed with the                            patient. Return to normal activities tomorrow.                            Written discharge instructions were provided to the                            patient.                           - Resume previous diet.                           - Continue present medications.                           - Repeat colonoscopy in 10 years for screening                             purposes.                           - See the other procedure note for documentation of                            additional recommendations. Ramir Malerba L. Myrtie Neither, MD 09/18/2020 8:28:18 AM This report has been signed electronically.

## 2020-09-18 NOTE — Patient Instructions (Signed)
Please read handouts provided. Continue present medications. Antireflux regimen indefinitely. Repeat colonoscopy in 10 years for screening.   YOU HAD AN ENDOSCOPIC PROCEDURE TODAY AT THE [facility]:   Refer to the procedure report that was given to you for any specific questions about what was found during the examination.  If the procedure report does not answer your questions, please call your gastroenterologist to clarify.  If you requested that your care partner not be given the details of your procedure findings, then the procedure report has been included in a sealed envelope for you to review at your convenience later.  YOU SHOULD EXPECT: Some feelings of bloating in the abdomen. Passage of more gas than usual.  Walking can help get rid of the air that was put into your GI tract during the procedure and reduce the bloating. If you had a lower endoscopy (such as a colonoscopy or flexible sigmoidoscopy) you may notice spotting of blood in your stool or on the toilet paper. If you underwent a bowel prep for your procedure, you may not have a normal bowel movement for a few days.  Please Note:  You might notice some irritation and congestion in your nose or some drainage.  This is from the oxygen used during your procedure.  There is no need for concern and it should clear up in a day or so.  SYMPTOMS TO REPORT IMMEDIATELY:  Following lower endoscopy (colonoscopy or flexible sigmoidoscopy):  Excessive amounts of blood in the stool  Significant tenderness or worsening of abdominal pains  Swelling of the abdomen that is new, acute  Fever of 100F or higher  Following upper endoscopy (EGD)  Vomiting of blood or coffee ground material  New chest pain or pain under the shoulder blades  Painful or persistently difficult swallowing  New shortness of breath  Fever of 100F or higher  Black, tarry-looking stools  For urgent or emergent issues, a gastroenterologist can be reached  at any hour by calling [phone]. Do not use MyChart messaging for urgent concerns.    DIET:  We do recommend a small meal at first, but then you may proceed to your regular diet.  Drink plenty of fluids but you should avoid alcoholic beverages for 24 hours.  ACTIVITY:  You should plan to take it easy for the rest of today and you should NOT DRIVE or use heavy machinery until tomorrow (because of the sedation medicines used during the test).    FOLLOW UP: Our staff will call the number listed on your records 48-72 hours following your procedure to check on you and address any questions or concerns that you may have regarding the information given to you following your procedure. If we do not reach you, we will leave a message.  We will attempt to reach you two times.  During this call, we will ask if you have developed any symptoms of COVID 19. If you develop any symptoms (ie: fever, flu-like symptoms, shortness of breath, cough etc.) before then, please call [phone].  If you test positive for Covid 19 in the 2 weeks post procedure, please call and report this information to [place].    If any biopsies were taken you will be contacted by phone or by letter within the next 1-3 weeks.  Please call us at [phone] if you have not heard about the biopsies in 3 weeks.    SIGNATURES/CONFIDENTIALITY: You and/or your care partner have signed paperwork which will be entered into  your electronic medical record.  These signatures attest to the fact that that the information above on your After Visit Summary has been reviewed and is understood.  Full responsibility of the confidentiality of this discharge information lies with you and/or your care-partner.

## 2020-09-18 NOTE — Progress Notes (Signed)
0800 Robinul 0.1 mg IV given due large amount of secretions upon assessment.  MD made aware, vss 

## 2020-09-22 ENCOUNTER — Telehealth: Payer: Self-pay | Admitting: *Deleted

## 2020-09-22 NOTE — Telephone Encounter (Signed)
First f/u call attempt.  LVM 

## 2020-09-22 NOTE — Telephone Encounter (Signed)
  Follow up Call-  Call back number 09/18/2020  Post procedure Call Back phone  # [phone]  Permission to leave phone message Yes  Some recent data might be hidden     Patient questions:  Do you have a fever, pain , or abdominal swelling? No. Pain Score  0 *  Have you tolerated food without any problems? Yes.    Have you been able to return to your normal activities? Yes.    Do you have any questions about your discharge instructions: Diet   No. Medications  No. Follow up visit  No.  Do you have questions or concerns about your Care? No.  Actions: * If pain score is 4 or above: No action needed, pain <4.  Have you developed a fever since your procedure? no  2.   Have you had an respiratory symptoms (SOB or cough) since your procedure? no  3.   Have you tested positive for COVID 19 since your procedure no  4.   Have you had any family members/close contacts diagnosed with the COVID 19 since your procedure?  no   If yes to any of these questions please route to Laverna Peace, RN and Karlton Lemon, RN

## 2020-11-18 ENCOUNTER — Other Ambulatory Visit: Payer: Self-pay | Admitting: Family Medicine

## 2020-11-18 ENCOUNTER — Other Ambulatory Visit (HOSPITAL_COMMUNITY): Payer: Self-pay | Admitting: Family Medicine

## 2020-11-18 DIAGNOSIS — M5412 Radiculopathy, cervical region: Secondary | ICD-10-CM

## 2020-11-24 ENCOUNTER — Other Ambulatory Visit: Payer: Self-pay

## 2020-11-24 ENCOUNTER — Ambulatory Visit (HOSPITAL_COMMUNITY)
Admission: RE | Admit: 2020-11-24 | Discharge: 2020-11-24 | Disposition: A | Discharge status: Home / Self Care | Payer: Commercial Managed Care - PPO | Source: Ambulatory Visit | Attending: Family Medicine | Admitting: Family Medicine

## 2020-11-24 DIAGNOSIS — M5412 Radiculopathy, cervical region: Secondary | ICD-10-CM | POA: Insufficient documentation

## 2020-11-24 IMAGING — MR MR CERVICAL SPINE W/O CM
5 series · 39 of 48 positions shown · non-contrast
Comparison: None.

CLINICAL DATA: Cervical radiculopathy

EXAM:
MRI CERVICAL SPINE WITHOUT CONTRAST
TECHNIQUE: Multiplanar, multisequence MR imaging of the cervical spine was
performed. No intravenous contrast was administered.

[Series 5: T2 · sagittal · 3.0mm · 0.69mm/px · 6 of 15 slices shown (1 of 2)]
[im 1/15]
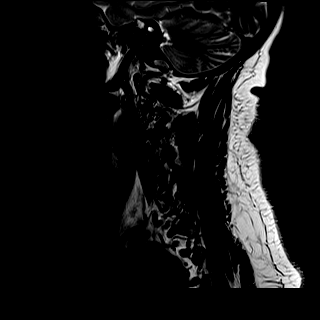
[im 3/15]
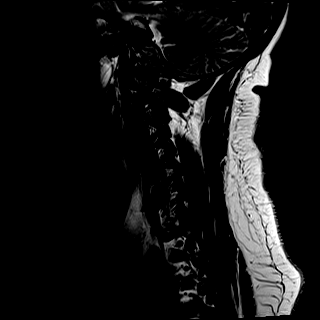
[im 6/15]
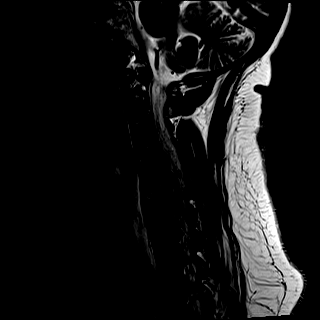
[im 9/15]
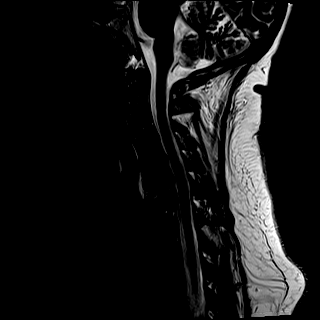
[im 12/15]
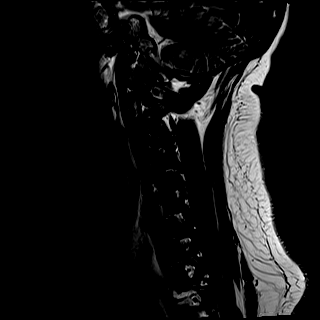
[im 15/15]
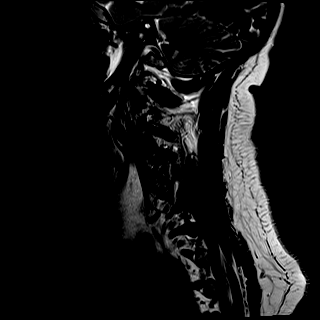

[Series 6: T1 · sagittal · 3.0mm · 0.86mm/px · 7 of 15 slices shown]
[im 1/15]
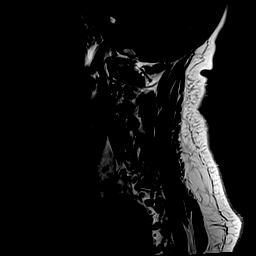
[im 3/15]
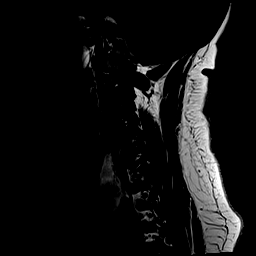
[im 5/15]
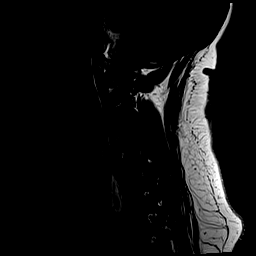
[im 8/15]
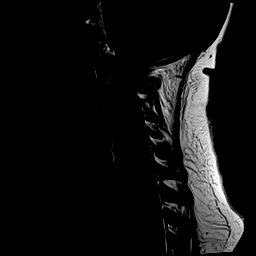
[im 10/15]
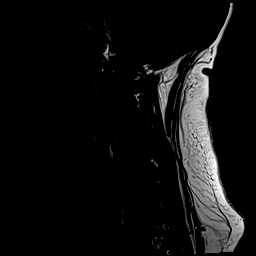
[im 12/15]
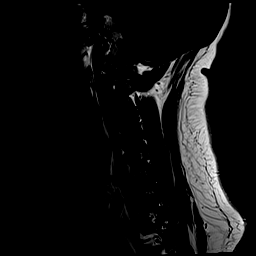
[im 15/15]
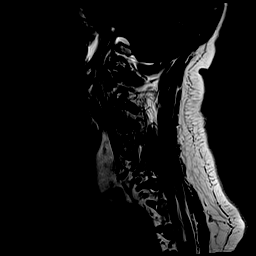

[Series 7: STIR · sagittal · 3.0mm · 0.69mm/px · 7 of 15 slices shown]
[im 1/15]
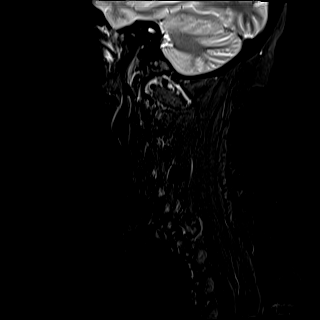
[im 3/15]
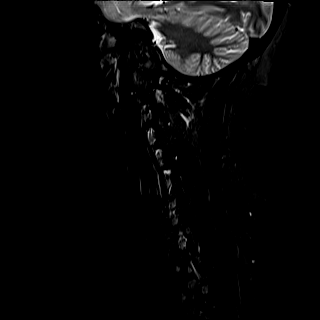
[im 5/15]
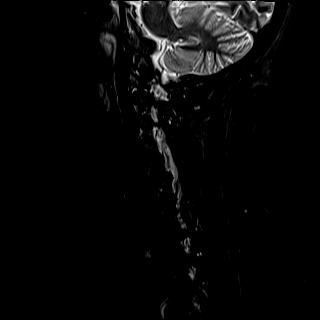
[im 8/15]
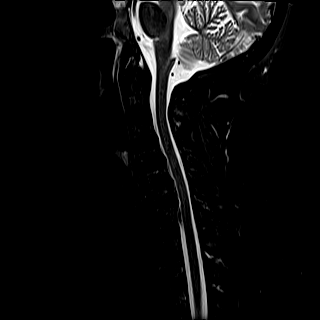
[im 10/15]
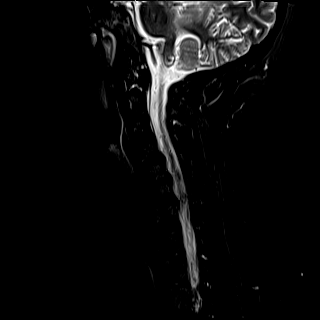
[im 12/15]
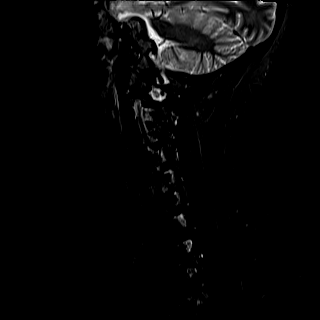
[im 15/15]
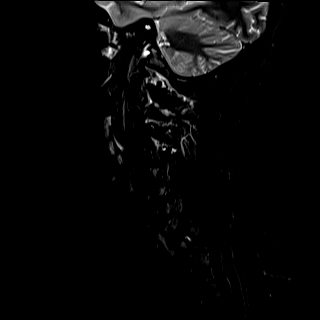

[Series 8: T2 · axial · 3.0mm · 0.70mm/px · z∈[-118,-14]mm · 11 of 32 slices shown (2 of 2)]
[im 1/32]
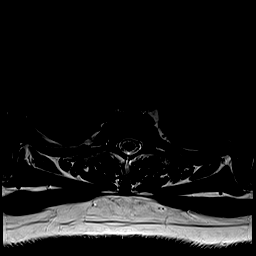
[im 3/32]
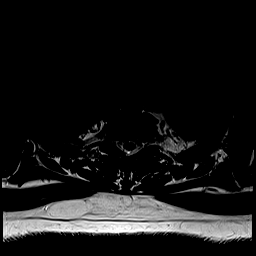
[im 5/32]
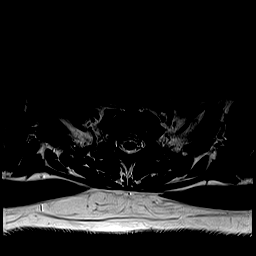
[im 8/32]
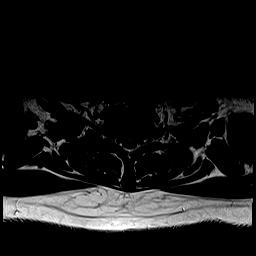
[im 10/32]
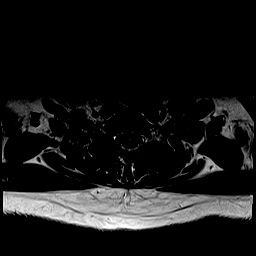
[im 12/32]
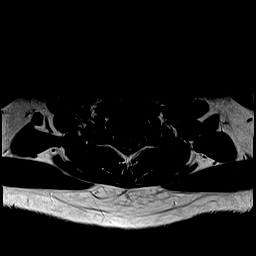
[im 15/32]
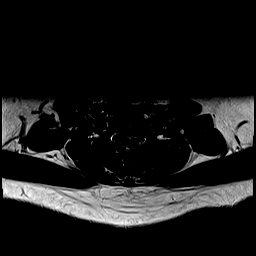
[im 17/32]
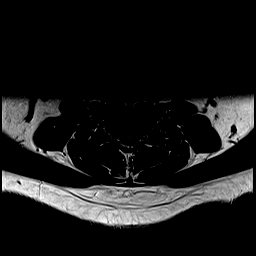
[im 22/32]
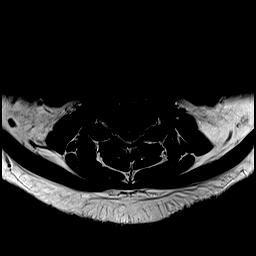
[im 27/32]
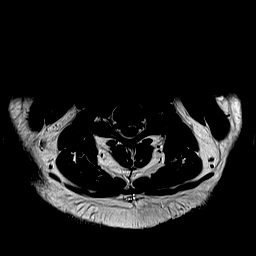
[im 32/32]
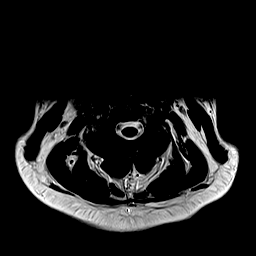

[Series 9: GRE · axial · 3.0mm · 0.35mm/px · z∈[-118,-14]mm · 8 of 32 slices shown]
[im 1/32]
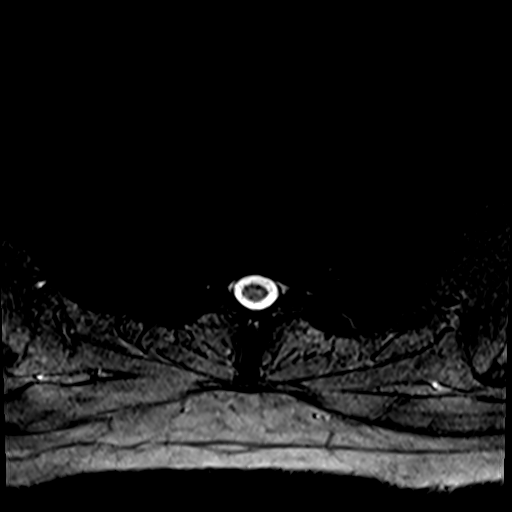
[im 5/32]
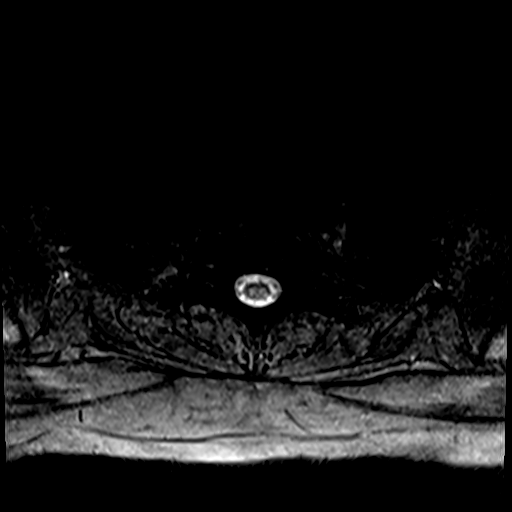
[im 10/32]
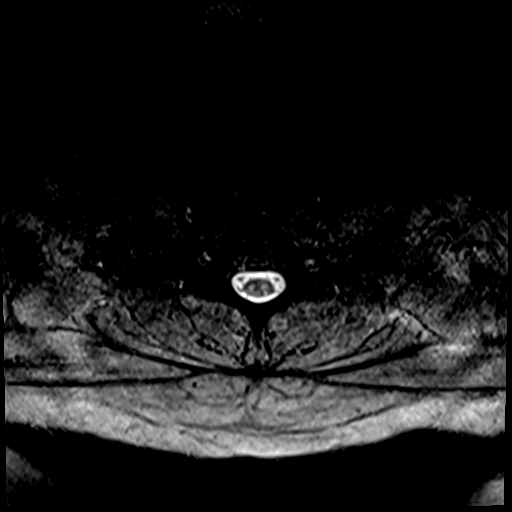
[im 15/32]
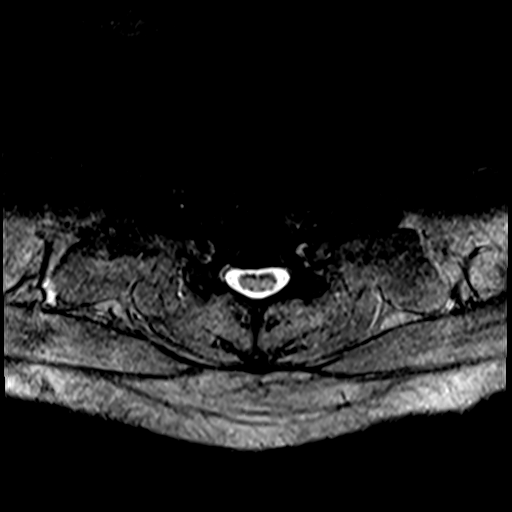
[im 17/32]
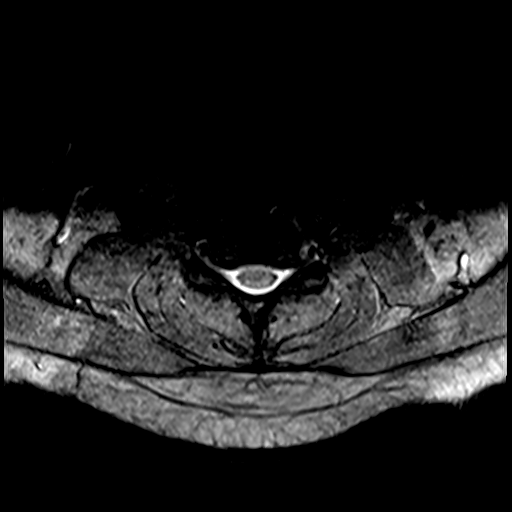
[im 22/32]
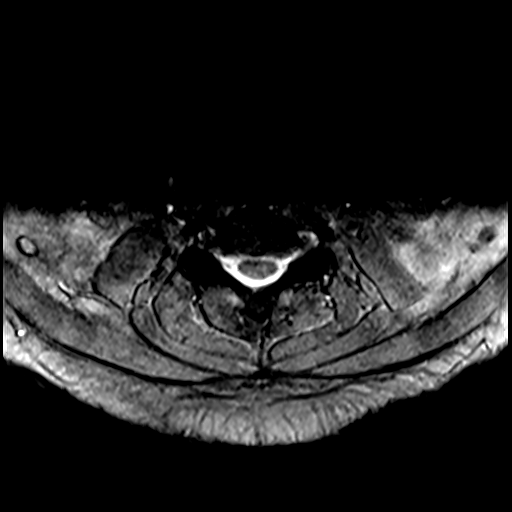
[im 27/32]
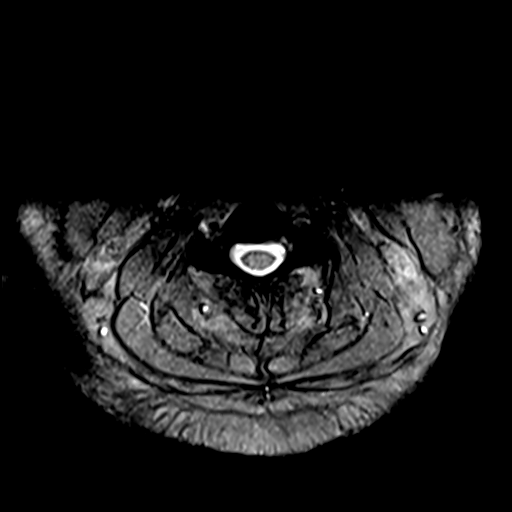
[im 32/32]
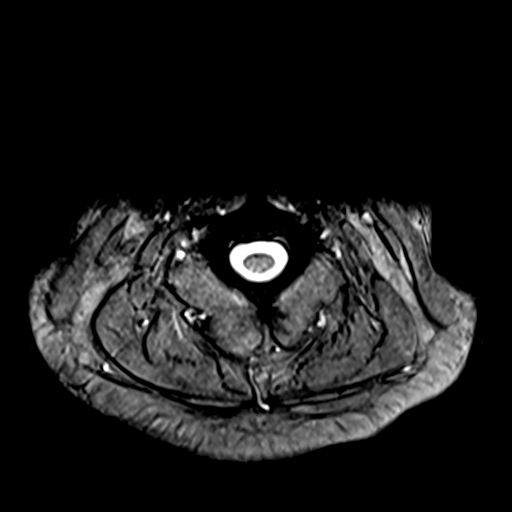

[39 of 48 positions shown; findings below may reference images not displayed]

FINDINGS: Alignment: Reversal of the cervical curvature. Small anterolisthesis
of C3 over C4.

Vertebrae: No fracture, evidence of discitis, or bone lesion.

Cord: Normal signal and morphology.

Posterior Fossa, vertebral arteries, paraspinal tissues: Negative.

Disc levels:

C2-3: No spinal canal or neural foraminal stenosis.

C3-4: Small posterior disc protrusion and facet degenerative
changes, left greater than right. No significant spinal canal or
neural foraminal stenosis.

C4-5: Posterior disc protrusion causing indentation of the thecal
sac without significant spinal canal stenosis. Uncovertebral and
facet degenerative changes resulting in mild right and moderate left
neural foraminal narrowing.

C5-6: Posterior disc protrusion resulting in mild spinal canal
stenosis. Uncovertebral and facet degenerative changes resulting in
moderate bilateral neural foraminal narrowing.

C6-7: Posterior disc protrusion resulting in mild spinal canal
stenosis. Uncovertebral and facet degenerative changes resulting in
moderate bilateral neural foraminal narrowing, left greater right.

C7-T1: Facet degenerative changes resulting in mild right neural
foraminal narrowing. No spinal canal stenosis.
IMPRESSION: 1. Degenerative changes of the cervical spine with mild spinal canal
stenosis and moderate bilateral neural foraminal narrowing at C5-6
and C6-7.
2. Moderate left neural foraminal narrowing at C4-5.

## 2021-02-09 ENCOUNTER — Other Ambulatory Visit (HOSPITAL_COMMUNITY): Payer: Self-pay | Admitting: Internal Medicine

## 2021-02-09 DIAGNOSIS — M5416 Radiculopathy, lumbar region: Secondary | ICD-10-CM

## 2021-02-23 ENCOUNTER — Other Ambulatory Visit: Payer: Self-pay

## 2021-02-23 ENCOUNTER — Ambulatory Visit (HOSPITAL_COMMUNITY)
Admission: RE | Admit: 2021-02-23 | Discharge: 2021-02-23 | Disposition: A | Discharge status: Home / Self Care | Payer: Commercial Managed Care - PPO | Source: Ambulatory Visit | Attending: Internal Medicine | Admitting: Internal Medicine

## 2021-02-23 DIAGNOSIS — M5416 Radiculopathy, lumbar region: Secondary | ICD-10-CM | POA: Insufficient documentation

## 2021-02-23 IMAGING — MR MR LUMBAR SPINE W/O CM
5 series · 31 of 48 positions shown · non-contrast
Comparison: None.

CLINICAL DATA: Low back pain radiating into the left leg for 3
months. No known injury.

EXAM:
MRI LUMBAR SPINE WITHOUT CONTRAST
TECHNIQUE: Multiplanar, multisequence MR imaging of the lumbar spine was
performed. No intravenous contrast was administered.

[Series 5: T2 · sagittal · 4.0mm · 0.68mm/px · 6 of 15 slices shown (1 of 2)]
[im 1/15]
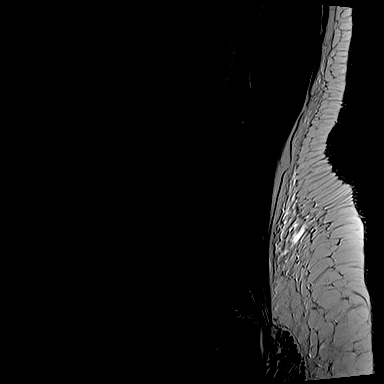
[im 3/15]
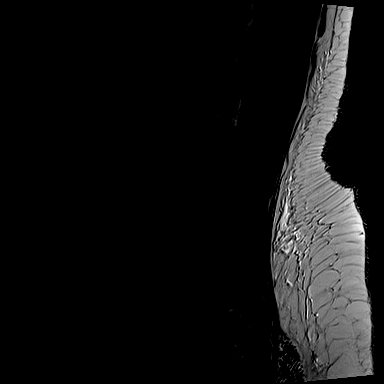
[im 6/15]
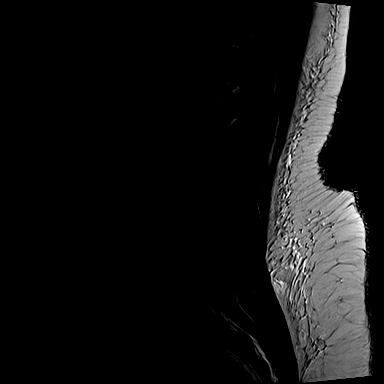
[im 9/15]
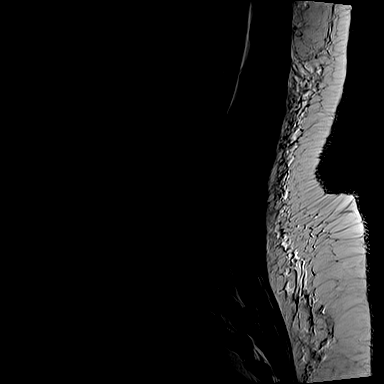
[im 12/15]
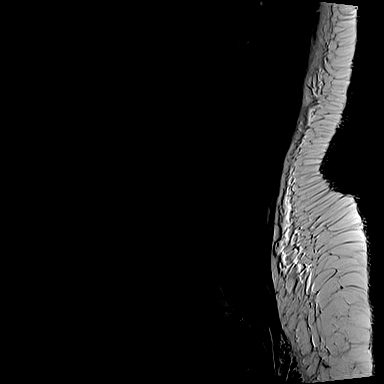
[im 15/15]
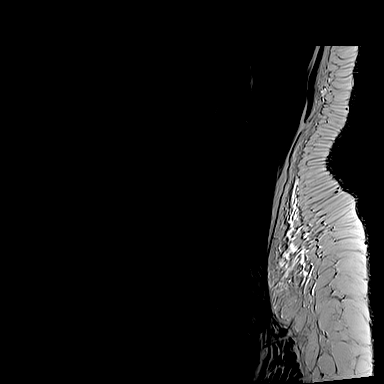

[Series 6: T1 · sagittal · 4.0mm · 0.81mm/px · 7 of 15 slices shown (1 of 2)]
[im 1/15]
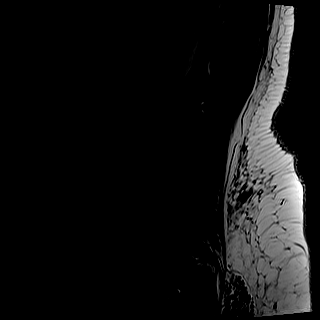
[im 3/15]
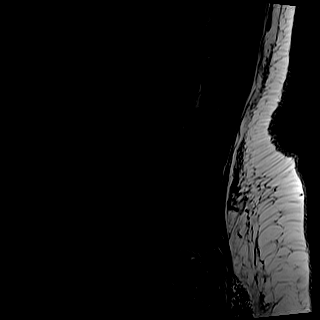
[im 5/15]
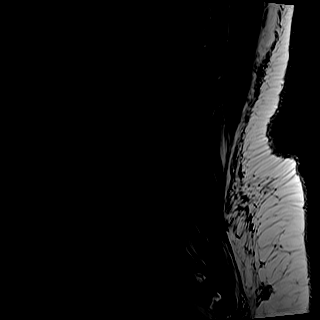
[im 8/15]
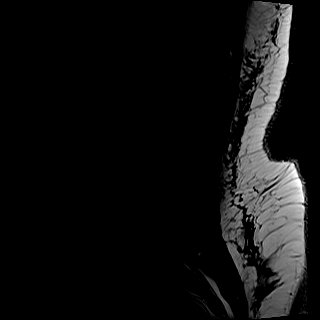
[im 10/15]
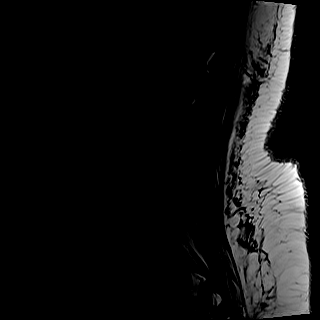
[im 12/15]
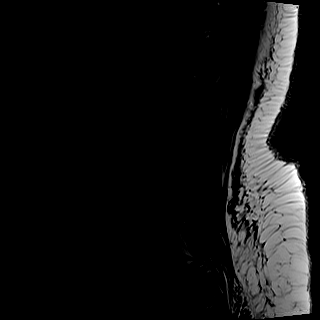
[im 15/15]
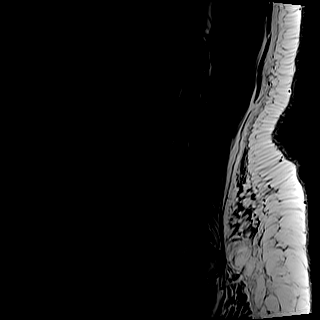

[Series 7: STIR · sagittal · 4.0mm · 0.51mm/px · 2 of 15 slices shown]
[im 1/15]
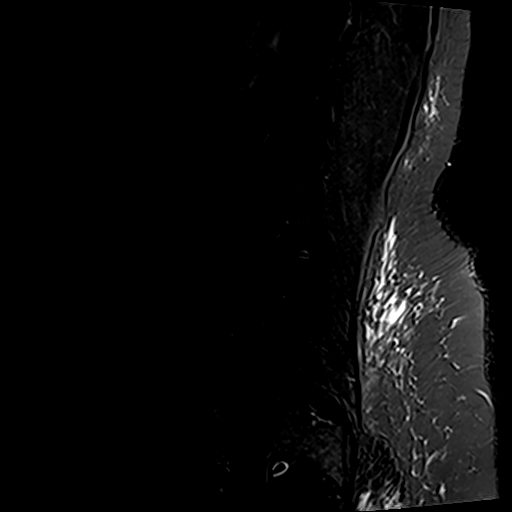
[im 3/15]
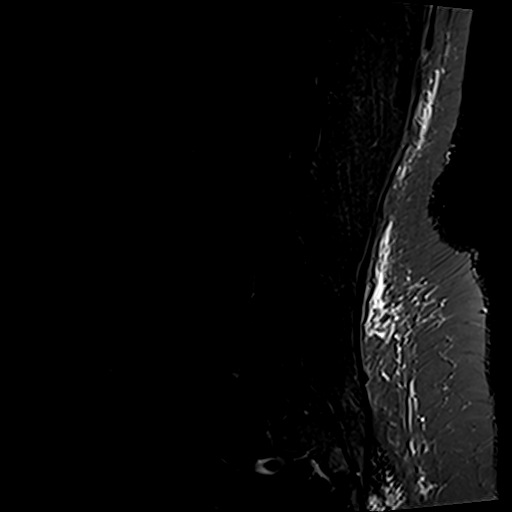

[Series 8: T2 · axial · 4.0mm · 0.70mm/px · z∈[-98,+89]mm · 8 of 31 slices shown (2 of 2)]
[im 1/31]
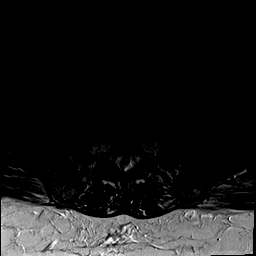
[im 5/31]
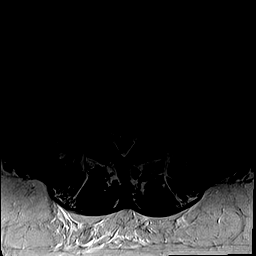
[im 10/31]
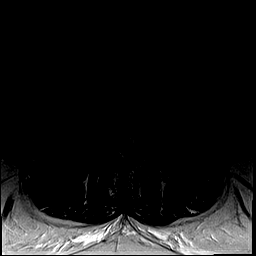
[im 14/31]
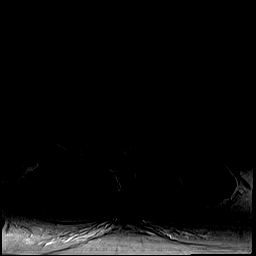
[im 17/31]
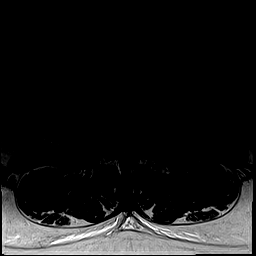
[im 21/31]
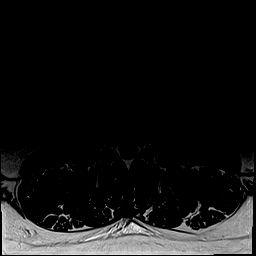
[im 26/31]
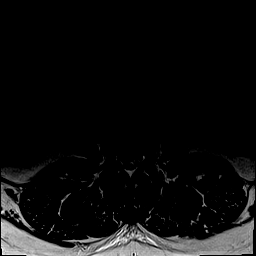
[im 31/31]
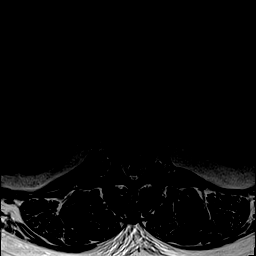

[Series 10: T1 · axial · 4.0mm · 0.35mm/px · z∈[-104,+84]mm · 8 of 31 slices shown (2 of 2)]
[im 1/31]
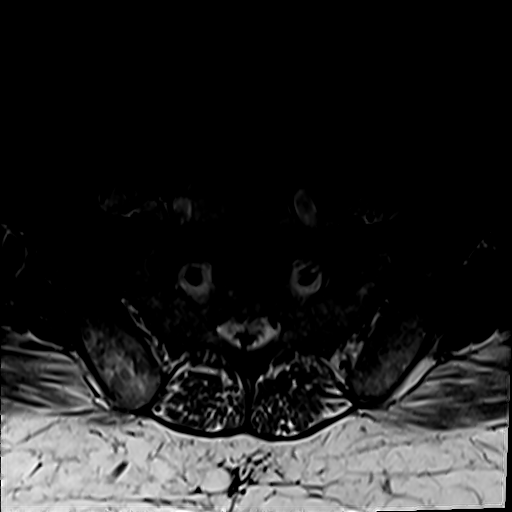
[im 5/31]
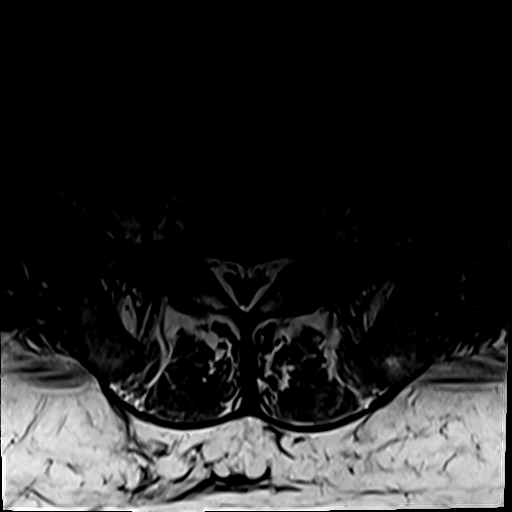
[im 10/31]
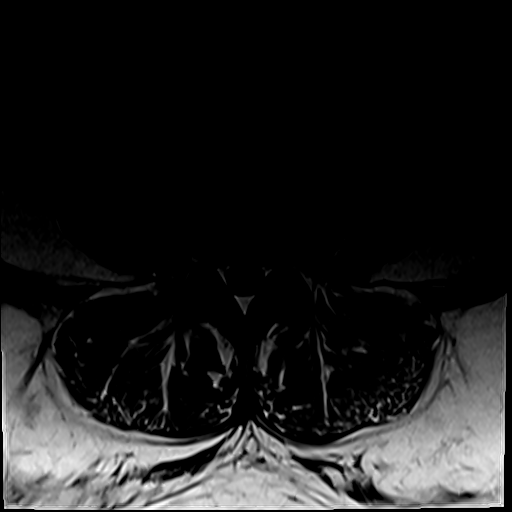
[im 14/31]
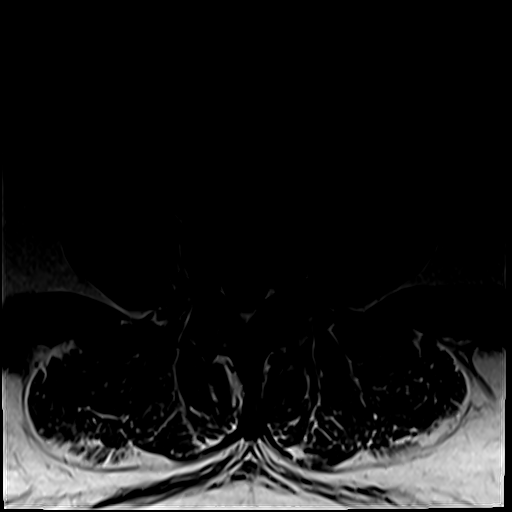
[im 17/31]
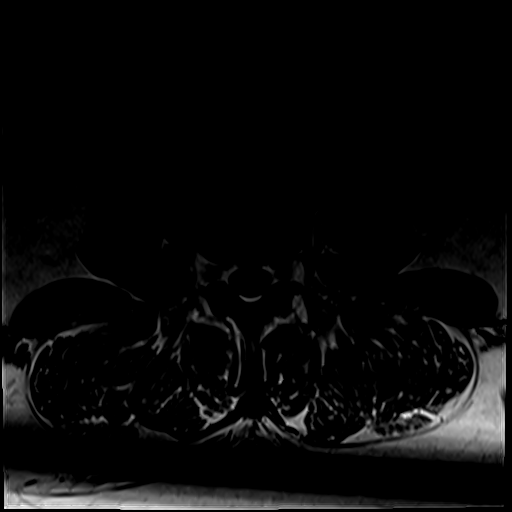
[im 21/31]
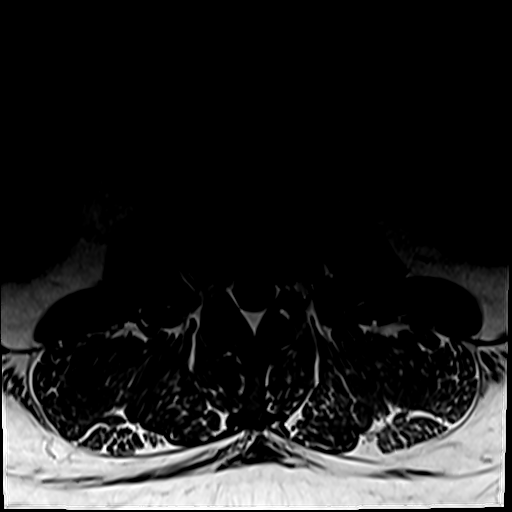
[im 26/31]
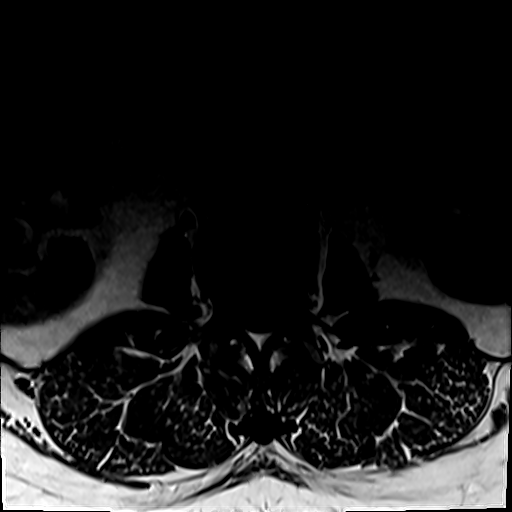
[im 31/31]
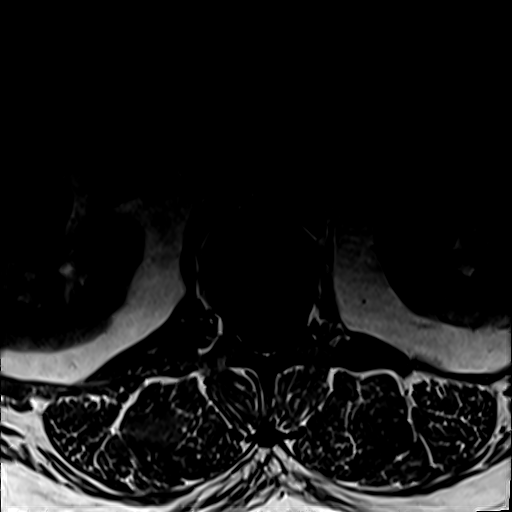

[31 of 48 positions shown; findings below may reference images not displayed]

FINDINGS: Segmentation: Conventional anatomy assumed, with the last open disc
space designated L5-S1.

Alignment:  Physiologic.

Vertebrae: No worrisome osseous lesion, acute fracture or pars
defect. The visualized sacroiliac joints appear unremarkable.

Conus medullaris: Extends to the L1-2 level and appears normal.

Paraspinal and other soft tissues: No significant paraspinal
findings.

Disc levels:

No significant disc space findings at T12-L1 or L1-2.

L2-3: Minimal disc bulging. No spinal stenosis or nerve root
encroachment.

L3-4: Mild disc bulging, facet and ligamentous hypertrophy.
Borderline spinal stenosis, in part secondary to short pedicles. No
foraminal compromise or exiting L3 nerve root encroachment.

L4-5: Mild disc bulging with a small left foraminal disc protrusion
which could encroach on the left L4 nerve root. Mild facet and
ligamentous hypertrophy. The lateral recesses and right foramen are
patent.

L5-S1: Mild disc bulging and shallow disc protrusion in the left
subarticular zone. No resulting S1 nerve root encroachment. There is
mild narrowing of the left foramen medially. Mild bilateral facet
hypertrophy.
IMPRESSION: 1. Small disc protrusion in the left L4-5 foramen could encroach on
the left L4 nerve root.
2. Broad-based disc protrusion in the left subarticular zone at
L5-S1 could encroach on the left L5 nerve root medially in the
foramen. No S1 nerve root encroachment.
3. No high-grade spinal stenosis or large disc herniation. Mild disc
bulging and facet hypertrophy at the additional levels as detailed
above.

## 2021-05-18 ENCOUNTER — Other Ambulatory Visit (HOSPITAL_COMMUNITY): Payer: Self-pay | Admitting: Internal Medicine

## 2021-05-18 ENCOUNTER — Other Ambulatory Visit: Payer: Self-pay

## 2021-05-18 ENCOUNTER — Ambulatory Visit (HOSPITAL_COMMUNITY)
Admission: RE | Admit: 2021-05-18 | Discharge: 2021-05-18 | Disposition: A | Discharge status: Home / Self Care | Payer: Commercial Managed Care - PPO | Source: Ambulatory Visit | Attending: Internal Medicine | Admitting: Internal Medicine

## 2021-05-18 DIAGNOSIS — M79672 Pain in left foot: Secondary | ICD-10-CM

## 2021-05-18 IMAGING — DX DG FOOT COMPLETE 3+V*L*
3 series · 3 of 3 positions shown · non-contrast
Comparison: None.

CLINICAL DATA: Left foot pain for 3 weeks. History of plantar
fascitis surgery.

EXAM:
LEFT FOOT - COMPLETE 3+ VIEW

[foot ap]
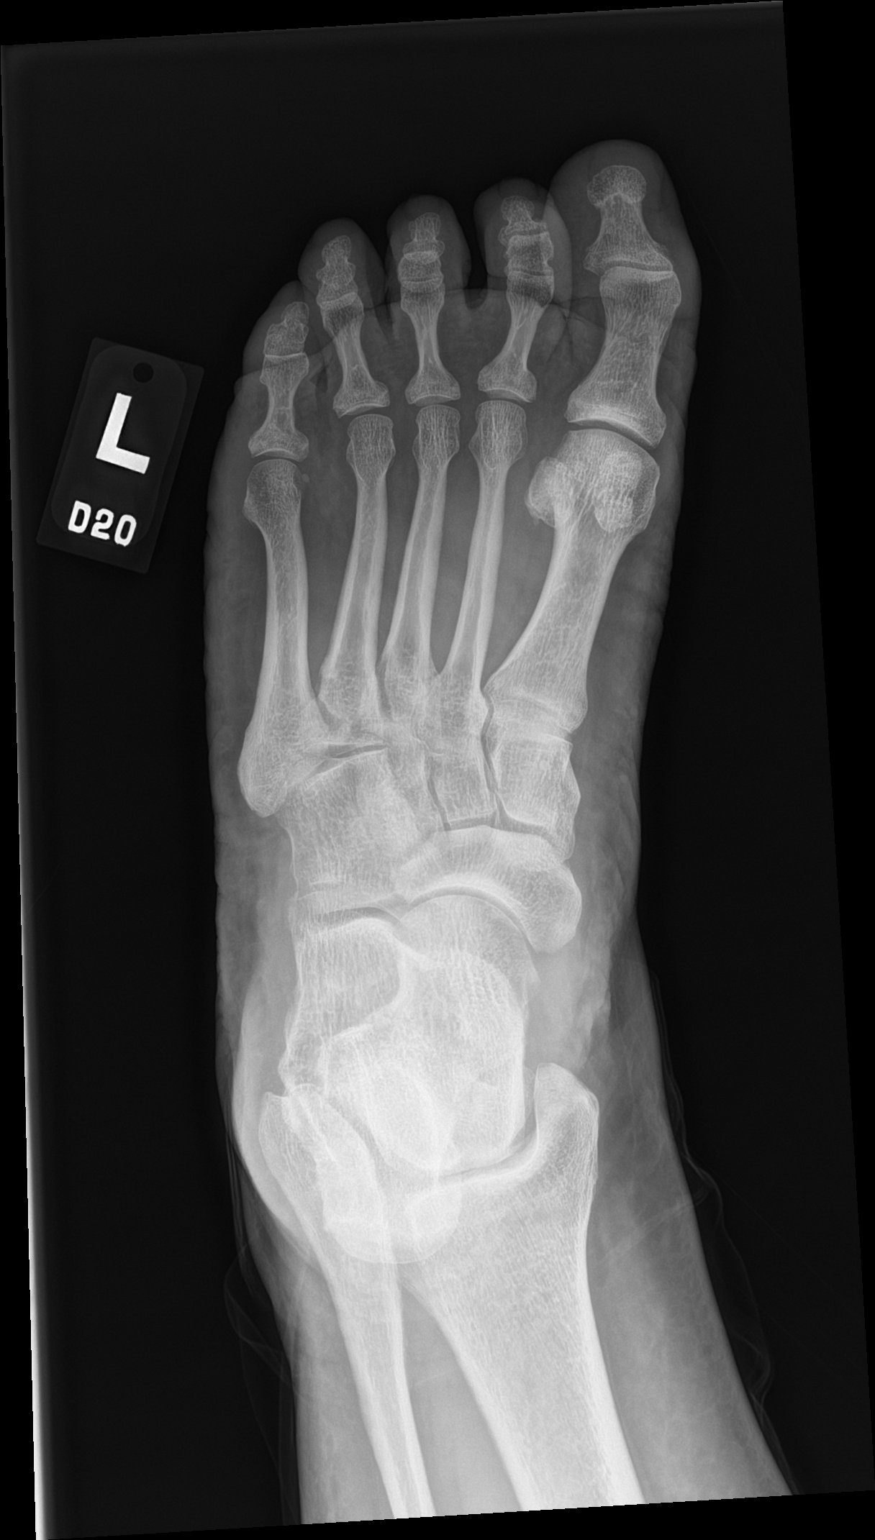

[foot obl]
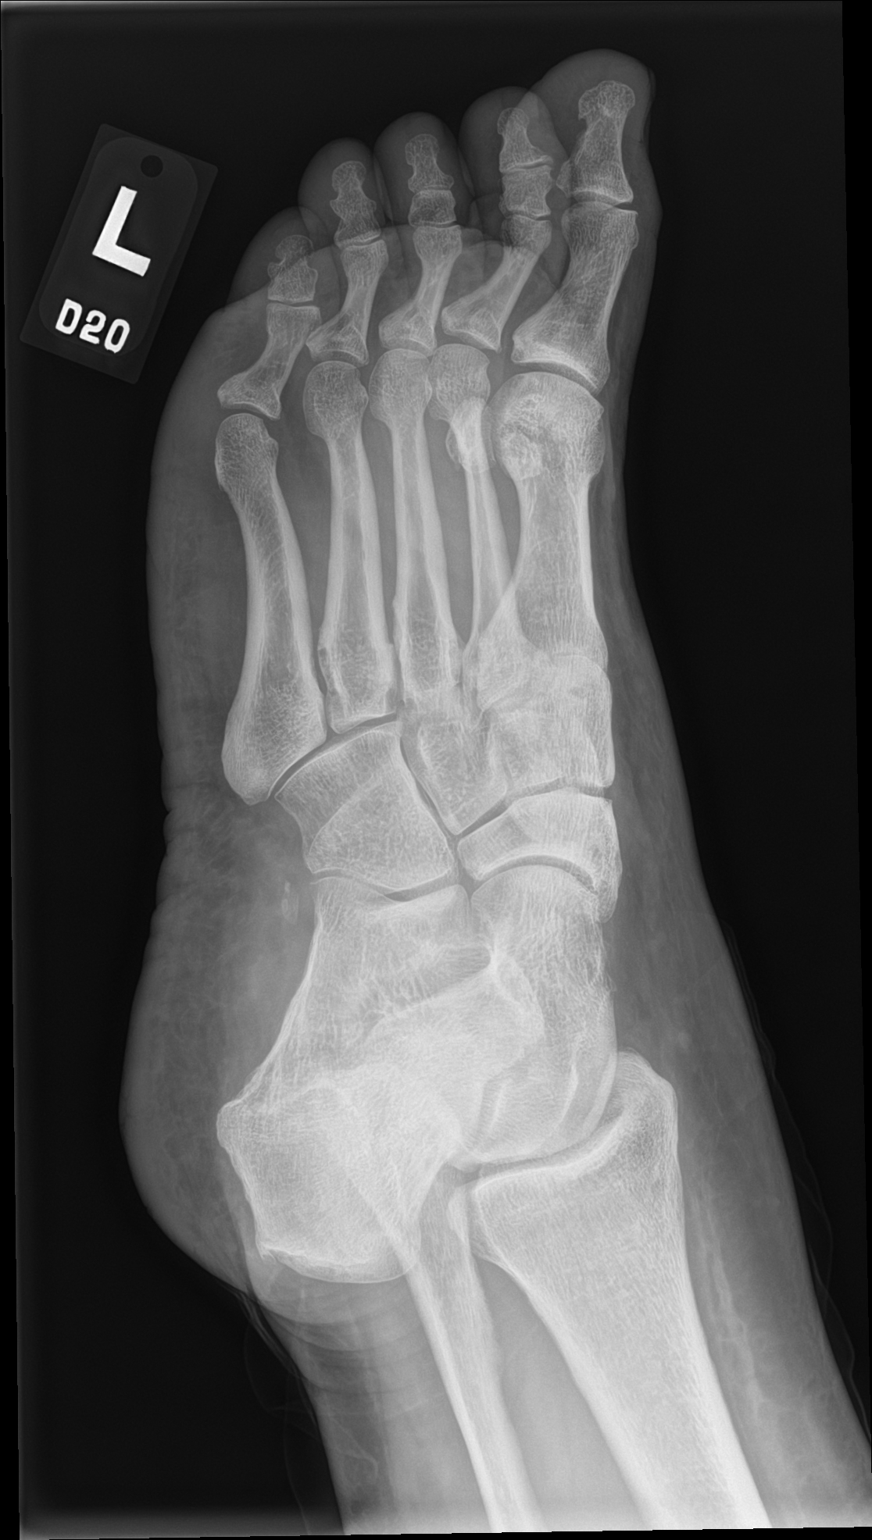

[foot lat]
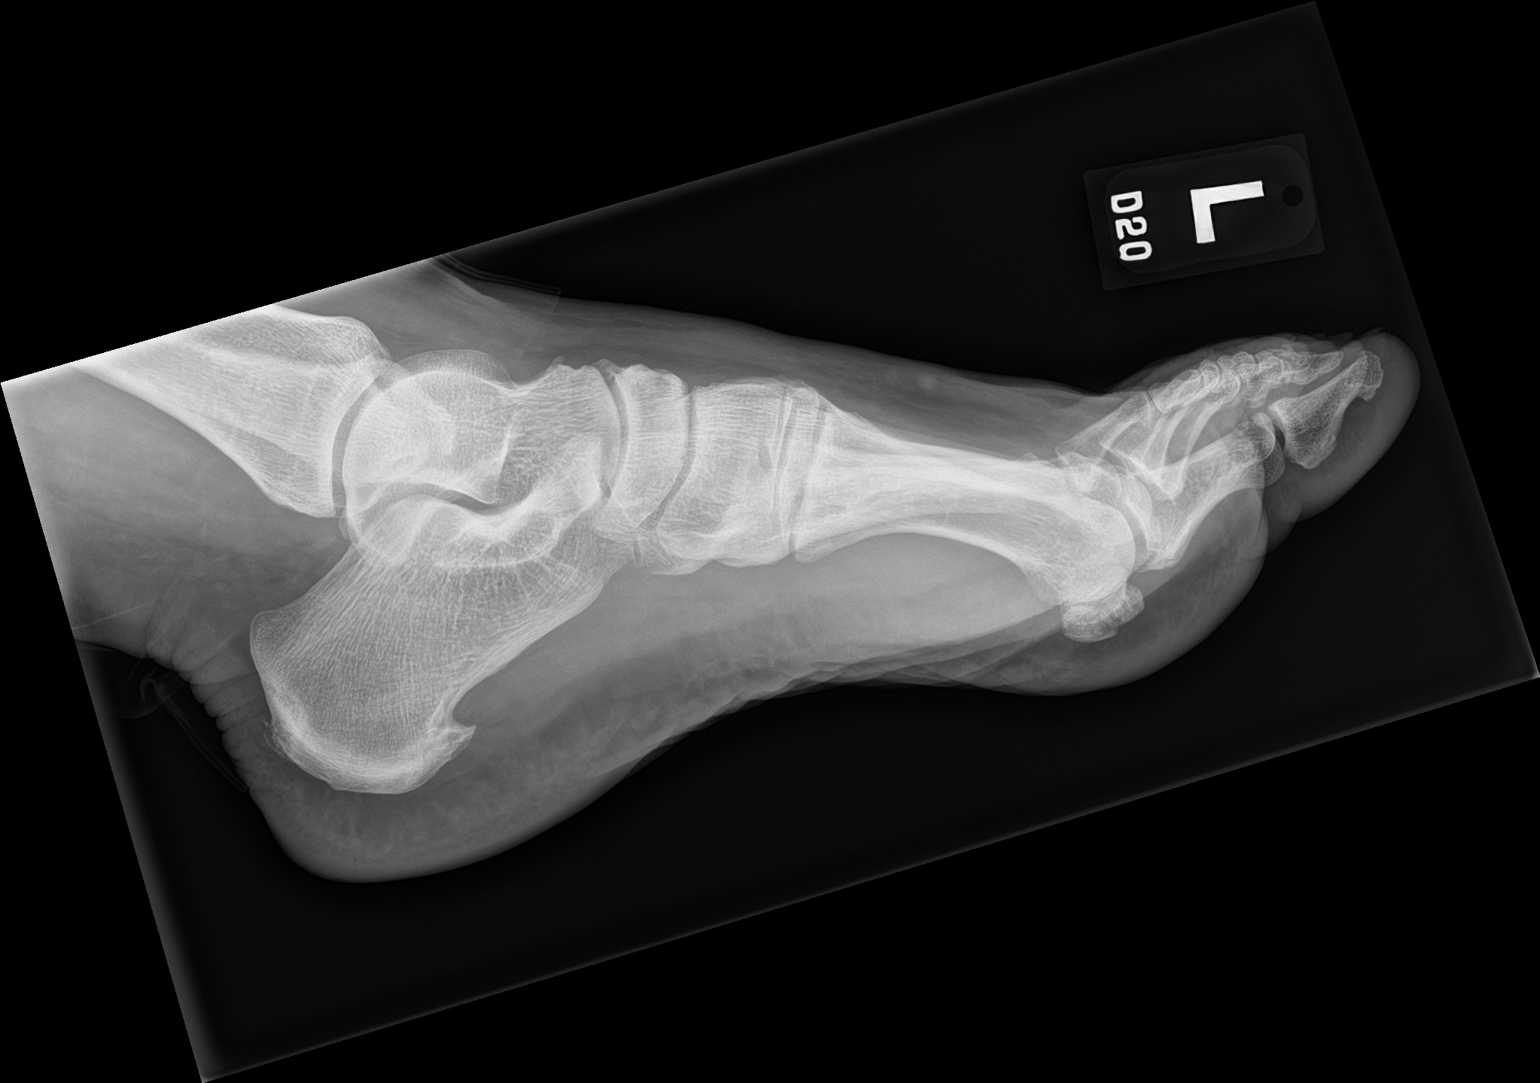

[3 of 3 positions shown; findings below may reference images not displayed]

FINDINGS: Normal alignment. Mild degenerative change of the talonavicular
joint with spurring. Moderate plantar calcaneal spur and small
Achilles tendon enthesophyte. No fracture, erosion, or periostitis
there is an os peroneal mild dorsal soft tissue edema.
IMPRESSION: 1. Mild midfoot degenerative change.
2. Moderate plantar calcaneal spur. Small Achilles tendon
enthesophyte.

## 2021-12-17 ENCOUNTER — Other Ambulatory Visit (HOSPITAL_COMMUNITY): Payer: Self-pay | Admitting: Neurosurgery

## 2021-12-17 ENCOUNTER — Other Ambulatory Visit: Payer: Self-pay | Admitting: Neurosurgery

## 2021-12-17 DIAGNOSIS — M542 Cervicalgia: Secondary | ICD-10-CM

## 2022-01-06 ENCOUNTER — Ambulatory Visit (HOSPITAL_COMMUNITY)
Admission: RE | Admit: 2022-01-06 | Discharge: 2022-01-06 | Disposition: A | Discharge status: Home / Self Care | Payer: Commercial Managed Care - PPO | Source: Ambulatory Visit | Attending: Neurosurgery | Admitting: Neurosurgery

## 2022-01-06 DIAGNOSIS — M542 Cervicalgia: Secondary | ICD-10-CM | POA: Diagnosis present

## 2022-07-05 ENCOUNTER — Emergency Department (HOSPITAL_COMMUNITY)
Admission: EM | Admit: 2022-07-05 | Discharge: 2022-07-05 | Disposition: A | Discharge status: Home / Self Care | Payer: Commercial Managed Care - PPO | Attending: Emergency Medicine | Admitting: Emergency Medicine

## 2022-07-05 ENCOUNTER — Other Ambulatory Visit: Payer: Self-pay

## 2022-07-05 ENCOUNTER — Encounter (HOSPITAL_COMMUNITY): Payer: Self-pay

## 2022-07-05 DIAGNOSIS — R748 Abnormal levels of other serum enzymes: Secondary | ICD-10-CM | POA: Diagnosis not present

## 2022-07-05 DIAGNOSIS — Z79899 Other long term (current) drug therapy: Secondary | ICD-10-CM | POA: Insufficient documentation

## 2022-07-05 DIAGNOSIS — E039 Hypothyroidism, unspecified: Secondary | ICD-10-CM | POA: Insufficient documentation

## 2022-07-05 DIAGNOSIS — E871 Hypo-osmolality and hyponatremia: Secondary | ICD-10-CM | POA: Insufficient documentation

## 2022-07-05 DIAGNOSIS — R739 Hyperglycemia, unspecified: Secondary | ICD-10-CM | POA: Diagnosis not present

## 2022-07-05 DIAGNOSIS — I10 Essential (primary) hypertension: Secondary | ICD-10-CM | POA: Diagnosis not present

## 2022-07-05 DIAGNOSIS — R7401 Elevation of levels of liver transaminase levels: Secondary | ICD-10-CM | POA: Diagnosis not present

## 2022-07-05 DIAGNOSIS — R7989 Other specified abnormal findings of blood chemistry: Secondary | ICD-10-CM

## 2022-07-05 DIAGNOSIS — R799 Abnormal finding of blood chemistry, unspecified: Secondary | ICD-10-CM | POA: Diagnosis present

## 2022-07-05 DIAGNOSIS — Z79890 Hormone replacement therapy (postmenopausal): Secondary | ICD-10-CM | POA: Diagnosis not present

## 2022-07-05 LAB — CBC WITH DIFFERENTIAL/PLATELET
Abs Immature Granulocytes: 0.02 10*3/uL (ref 0.00–0.07)
Basophils Absolute: 0.1 10*3/uL (ref 0.0–0.1)
Basophils Relative: 1 %
Eosinophils Absolute: 0.1 10*3/uL (ref 0.0–0.5)
Eosinophils Relative: 2 %
HCT: 41.1 % (ref 36.0–46.0)
Hemoglobin: 13.7 g/dL (ref 12.0–15.0)
Immature Granulocytes: 0 %
Lymphocytes Relative: 46 %
Lymphs Abs: 3 10*3/uL (ref 0.7–4.0)
MCH: 29.8 pg (ref 26.0–34.0)
MCHC: 33.3 g/dL (ref 30.0–36.0)
MCV: 89.5 fL (ref 80.0–100.0)
Monocytes Absolute: 0.4 10*3/uL (ref 0.1–1.0)
Monocytes Relative: 6 %
Neutro Abs: 3 10*3/uL (ref 1.7–7.7)
Neutrophils Relative %: 45 %
Platelets: 203 10*3/uL (ref 150–400)
RBC: 4.59 MIL/uL (ref 3.87–5.11)
RDW: 13 % (ref 11.5–15.5)
WBC: 6.5 10*3/uL (ref 4.0–10.5)
nRBC: 0 % (ref 0.0–0.2)

## 2022-07-05 LAB — BLOOD GAS, VENOUS
Acid-Base Excess: 6.4 mmol/L — ABNORMAL HIGH (ref 0.0–2.0)
Bicarbonate: 31.9 mmol/L — ABNORMAL HIGH (ref 20.0–28.0)
Drawn by: 42924
O2 Saturation: 53.3 %
Patient temperature: 37.1
pCO2, Ven: 48 mmHg (ref 44–60)
pH, Ven: 7.43 (ref 7.25–7.43)
pO2, Ven: <31 mmHg — CL (ref 32–45)

## 2022-07-05 LAB — CBG MONITORING, ED
Glucose-Capillary: 497 mg/dL — ABNORMAL HIGH (ref 70–99)
Glucose-Capillary: 325 mg/dL — ABNORMAL HIGH (ref 70–99)
Glucose-Capillary: 316 mg/dL — ABNORMAL HIGH (ref 70–99)

## 2022-07-05 LAB — URINALYSIS, ROUTINE W REFLEX MICROSCOPIC
Bacteria, UA: NONE SEEN
Bilirubin Urine: NEGATIVE
Glucose, UA: >=500 mg/dL — AB
Hgb urine dipstick: NEGATIVE
Ketones, ur: 20 mg/dL — AB
Leukocytes,Ua: NEGATIVE
Nitrite: NEGATIVE
Protein, ur: NEGATIVE mg/dL
Specific Gravity, Urine: 1.035 — ABNORMAL HIGH (ref 1.005–1.030)
pH: 6 (ref 5.0–8.0)

## 2022-07-05 LAB — PROTIME-INR
INR: 1 (ref 0.8–1.2)
Prothrombin Time: 13.4 seconds (ref 11.4–15.2)

## 2022-07-05 LAB — LIPASE, BLOOD: Lipase: 26 U/L (ref 11–51)

## 2022-07-05 LAB — AMMONIA: Ammonia: 13 umol/L (ref 9–35)

## 2022-07-05 LAB — BASIC METABOLIC PANEL
Anion gap: 13 (ref 5–15)
BUN: 8 mg/dL (ref 6–20)
CO2: 26 mmol/L (ref 22–32)
Calcium: 9 mg/dL (ref 8.9–10.3)
Chloride: 92 mmol/L — ABNORMAL LOW (ref 98–111)
Creatinine, Ser: 0.72 mg/dL (ref 0.44–1.00)
GFR, Estimated: >60 mL/min (ref >60)
Glucose, Bld: 432 mg/dL — ABNORMAL HIGH (ref 70–99)
Potassium: 3.6 mmol/L (ref 3.5–5.1)
Sodium: 131 mmol/L — ABNORMAL LOW (ref 135–145)

## 2022-07-05 LAB — HEPATIC FUNCTION PANEL
ALT: 66 U/L — ABNORMAL HIGH (ref 0–44)
AST: 51 U/L — ABNORMAL HIGH (ref 15–41)
Albumin: 4.1 g/dL (ref 3.5–5.0)
Alkaline Phosphatase: 147 U/L — ABNORMAL HIGH (ref 38–126)
Bilirubin, Direct: 0.1 mg/dL (ref 0.0–0.2)
Indirect Bilirubin: 0.9 mg/dL (ref 0.3–0.9)
Total Bilirubin: 1 mg/dL (ref 0.3–1.2)
Total Protein: 7.7 g/dL (ref 6.5–8.1)

## 2022-07-05 LAB — TSH: TSH: 0.642 u[IU]/mL (ref 0.350–4.500)

## 2022-07-05 LAB — ETHANOL: Alcohol, Ethyl (B): <10 mg/dL (ref <10)

## 2022-07-05 LAB — T4, FREE: Free T4: 1.53 ng/dL — ABNORMAL HIGH (ref 0.61–1.12)

## 2022-07-05 LAB — ACETAMINOPHEN LEVEL: Acetaminophen (Tylenol), Serum: <10 ug/mL — ABNORMAL LOW (ref 10–30)

## 2022-07-05 MED ORDER — LANCET DEVICE MISC: 1.0000 | Freq: Three times a day (TID) | 0 refills | Status: AC

## 2022-07-05 MED ORDER — BLOOD GLUCOSE TEST VI STRP: 1.0000 | ORAL_STRIP | Freq: Three times a day (TID) | 0 refills | Status: AC

## 2022-07-05 MED ORDER — LACTATED RINGERS IV BOLUS
500.0000 mL | Freq: Once | INTRAVENOUS | Status: AC
  Administered 2022-07-05: 500 mL via INTRAVENOUS

## 2022-07-05 MED ORDER — METFORMIN HCL 500 MG PO TABS: 500.0000 mg | ORAL_TABLET | Freq: Two times a day (BID) | ORAL | 0 refills | Status: DC

## 2022-07-05 MED ORDER — LACTATED RINGERS IV BOLUS
1000.0000 mL | Freq: Once | INTRAVENOUS | Status: AC
  Administered 2022-07-05: 1000 mL via INTRAVENOUS

## 2022-07-05 MED ORDER — BLOOD GLUCOSE MONITORING SUPPL DEVI: 1.0000 | Freq: Three times a day (TID) | 0 refills | Status: AC

## 2022-07-05 MED ORDER — METFORMIN HCL 500 MG PO TABS
500.0000 mg | ORAL_TABLET | Freq: Once | ORAL | Status: AC
  Administered 2022-07-05: 500 mg via ORAL
  Filled 2022-07-05: qty 1

## 2022-07-05 MED ORDER — LANCETS MISC. MISC: 1.0000 | Freq: Three times a day (TID) | 0 refills | Status: AC

## 2022-07-05 NOTE — Discharge Instructions (Addendum)
You were seen in the ER today for evaluation of your abnormal lab values.  Your sugar was in the 400s.  We have gone this fluid for you by giving you some fluids.  I have discussed your care with your primary care doctor.  He asked that I start you on some metformin and he would like to see you as a walk-in on Monday or Tuesday.  I included some more information on metformin and into the discharge paperwork.  I have also sent in some blood glucose monitoring kit.  I have placed an outpatient diabetes education coordinator who should hopefully reach out to you within the next few days.  Please make sure you are eating balanced meals and staying well-hydrated.  I have included more information on how to use your glucometer as well as an introduction into diabetes into the discharge paperwork.  Please read and watch the videos.  If you have any concerns, new or worsening symptoms, please return to the nearest emergency department for evaluation.  Contact a doctor if: Your blood sugar level is at or above 240 mg/dL (67.2 mmol/L) for 2 days in a row. You have problems keeping your blood sugar in your target range. You have high blood pressure often. You have signs of illness, such as: Feeling like you may vomit (feeling nauseous). Vomiting. A fever. Get help right away if: Your blood sugar monitor reads "high" even when you are taking insulin. You have trouble breathing. You have a change in how you think, feel, or act (mental status). You feel like you may vomit, and the feeling does not go away. You cannot stop vomiting. These symptoms may be an emergency. Get medical help right away. Call your local emergency services (911 in the [place] Do not wait to see if the symptoms will go away. Do not drive yourself to the hospital.

## 2022-07-05 NOTE — ED Provider Notes (Signed)
[place] EMERGENCY DEPARTMENT AT [facility] Provider Note   CSN: [id number] Arrival date & time: 07/05/22  1220     History Chief Complaint  Patient presents with   Abnormal Lab    Debbie Goodwin is a 52 y.o. female with h/o fibromyalgia, prediabetes, arthritis, neuropathy, hyperlipidemia, hypertension, hypothyroid presents the emergency department today for evaluation of abnormal labs.  She reports that she is lost 50 pounds over the past 2 months is been feeling more fatigued.  She also ports that she has been having an increased thirst and increase in her urine output.  She reports this is also a decrease in appetite for a month.  She reports that her body aches been worse the past week when she had a URI 5 weeks ago.  Unsure if her body aches are related to her fibromyalgia or not.  She denies any abdominal pain, chest pain, short of breath, nausea, vomiting, diarrhea, constipation or melena, hematochezia, dysuria, vaginal bleeding, vaginal discharge, fever, or night sweats.  She was sent over here reportedly because her glucose level, kidney function, and liver functions were elevated.   Abnormal Lab      Home Medications Prior to Admission medications   Medication Sig Start Date End Date Taking? Authorizing Provider  amitriptyline (ELAVIL) 25 MG tablet Take 25-50 mg by mouth at bedtime. 05/09/22  Yes [provider]  betamethasone dipropionate 0.05 % cream Apply topically 2 (two) times daily as needed. 05/09/22  Yes [provider]  buPROPion (WELLBUTRIN XL) 300 MG 24 hr tablet Take 300 mg by mouth daily.   Yes [provider]  Cholecalciferol (VITAMIN D-3 PO) Take by mouth.   Yes [provider]  cyclobenzaprine (FLEXERIL) 10 MG tablet Take by mouth. 06/08/21  Yes [provider]  DULoxetine (CYMBALTA) 60 MG capsule Take 60 mg by mouth daily.   Yes [provider]  famotidine (PEPCID) 20 MG tablet Take 1 tablet (20  mg total) by mouth at bedtime. 06/25/20  Yes Meredith Pel, NP  Flaxseed, Linseed, (FLAXSEED OIL PO) Take by mouth.   Yes [provider]  fluconazole (DIFLUCAN) 100 MG tablet Take 100 mg by mouth daily. 06/18/22  Yes [provider]  gabapentin (NEURONTIN) 300 MG capsule Take 900 mg by mouth daily.   Yes [provider]  ibuprofen (ADVIL,MOTRIN) 200 MG tablet Take 800 mg by mouth every 6 (six) hours as needed for moderate pain.   Yes [provider]  levothyroxine (SYNTHROID) 100 MCG tablet Take 100 mcg by mouth daily. 04/06/20  Yes [provider]  olmesartan (BENICAR) 40 MG tablet Take 40 mg by mouth daily. 06/03/20  Yes [provider]  Omega-3 Fatty Acids (FISH OIL) 300 MG CAPS Take 1 capsule by mouth daily.   Yes [provider]  omeprazole (PRILOSEC) 40 MG capsule Take 40 mg by mouth daily.   Yes [provider]  Oxycodone HCl 10 MG TABS Take 1 tablet by mouth every 6 (six) hours as needed (pain).   Yes [provider]  pantoprazole (PROTONIX) 40 MG tablet Take 40 mg by mouth daily. 06/28/22  Yes [provider]  polyvinyl alcohol (LIQUIFILM TEARS) 1.4 % ophthalmic solution Place 1 drop into both eyes as needed for dry eyes.   Yes [provider]  pramipexole (MIRAPEX) 0.25 MG tablet Take 0.25 mg by mouth daily. 06/28/22  Yes [provider]  rOPINIRole (REQUIP) 0.5 MG tablet Take 0.5 mg by mouth 3 (  three) times daily. 05/22/20  Yes [provider]  rOPINIRole (REQUIP) 1 MG tablet Take 1 mg by mouth daily. 06/28/22  Yes [provider]  simvastatin (ZOCOR) 40 MG tablet Take 40 mg by mouth daily.   Yes [provider]  Zinc Acetate, Oral, (ZINC ACETATE PO) Take by mouth.   Yes [provider]      Allergies    Sulfa antibiotics    Review of Systems   Review of Systems  Constitutional:  Positive for appetite change, fatigue and unexpected weight change.   HENT:  Negative for congestion and rhinorrhea.   Respiratory:  Negative for shortness of breath.   Cardiovascular:  Negative for chest pain.  Gastrointestinal:  Negative for abdominal pain, constipation, diarrhea and vomiting.  Endocrine: Positive for polydipsia and polyuria.  Genitourinary:  Negative for dysuria, vaginal bleeding and vaginal discharge.  Musculoskeletal:  Positive for myalgias.  Neurological:  Negative for light-headedness and headaches.    Physical Exam Updated Vital Signs BP 109/77   Pulse 80   Temp 98.5 F (36.9 C)   Resp 18   Ht 5\' 3"  (1.6 m)   Wt 107.5 kg   LMP 07/26/2020 Comment: denies possibility of pregnancy  SpO2 96%   BMI 41.98 kg/m  Physical Exam Vitals and nursing note reviewed.  Constitutional:      General: She is not in acute distress.    Appearance: She is obese. She is not ill-appearing or toxic-appearing.  HENT:     Head: Normocephalic and atraumatic.     Mouth/Throat:     Mouth: Mucous membranes are moist.  Eyes:     General: No scleral icterus. Cardiovascular:     Rate and Rhythm: Normal rate and regular rhythm.  Pulmonary:     Effort: Pulmonary effort is normal. No respiratory distress.     Breath sounds: Normal breath sounds.  Abdominal:     General: Bowel sounds are normal.     Palpations: Abdomen is soft.     Tenderness: There is no abdominal tenderness. There is no guarding or rebound.  Musculoskeletal:        General: No deformity.     Cervical back: Normal range of motion.  Lymphadenopathy:     Cervical: No cervical adenopathy.  Skin:    General: Skin is warm and dry.  Neurological:     General: No focal deficit present.     Mental Status: She is alert. Mental status is at baseline.     ED Results / Procedures / Treatments   Labs (all labs ordered are listed, but only abnormal results are displayed) Labs Reviewed  URINALYSIS, ROUTINE W REFLEX MICROSCOPIC - Abnormal; Notable for the following components:       Result Value   Specific Gravity, Urine 1.035 (*)    Glucose, UA >=500 (*)    Ketones, ur 20 (*)    All other components within normal limits  HEPATIC FUNCTION PANEL - Abnormal; Notable for the following components:   AST 51 (*)    ALT 66 (*)    Alkaline Phosphatase 147 (*)    All other components within normal limits  BASIC METABOLIC PANEL - Abnormal; Notable for the following components:   Sodium 131 (*)    Chloride 92 (*)    Glucose, Bld 432 (*)    All other components within normal limits  ACETAMINOPHEN LEVEL - Abnormal; Notable for the following components:   Acetaminophen (Tylenol), Serum <10 (*)    All  other components within normal limits  BLOOD GAS, VENOUS - Abnormal; Notable for the following components:   pO2, Ven <31 (*)    Bicarbonate 31.9 (*)    Acid-Base Excess 6.4 (*)    All other components within normal limits  CBG MONITORING, ED - Abnormal; Notable for the following components:   Glucose-Capillary 497 (*)    All other components within normal limits  CBG MONITORING, ED - Abnormal; Notable for the following components:   Glucose-Capillary 325 (*)    All other components within normal limits  CBC WITH DIFFERENTIAL/PLATELET  PROTIME-INR  LIPASE, BLOOD  AMMONIA  ETHANOL  TSH  T4, FREE    EKG None  Radiology No results found.  Procedures Procedures   Medications Ordered in ED Medications  lactated ringers bolus 1,000 mL (1,000 mLs Intravenous New Bag/Given 07/05/22 1459)    ED Course/ Medical Decision Making/ A&P                            Medical Decision Making Amount and/or Complexity of Data Reviewed Labs: ordered.  Risk OTC drugs. Prescription drug management.   52 y.o. female presents to the ER for evaluation of abnormal lab values. Differential diagnosis includes but is not limited to HHS, DKA, hyperglycemia, malignancy, dehydration, anemia. Vital signs unremarkable. Physical exam as noted above.   I called [facility] and  spoke with Dr. Bebe LiterFosko, her PCP. I can not see their records and was concerned if there was anything else he was sending her in for. He reports that her glucose was reading as >500 and was concerned with her glucose being that elevated. He reports that the liver enzymes were only mildly elevated and his office will investigate and further workup the weight loss.  He recommends 500 mg metformin twice daily.  I appreciate his consultation on the patient.   I independently reviewed and interpreted the patient's labs.  CBC without leukocytosis or anemia.  Ammonia within normal limits.  Ethanol level and acetaminophen level are negative.  BMP shows mild sodium decreased 131 although likely pseudohyponatremia because her glucose is 432.  Mildly decreased chloride at 92 otherwise no electrolyte abnormality.  Normal anion gap.  Normal bicarb.  Hepatic function panel shows mildly elevated AST, ALT, and alk phos however normal bilirubin.  Lipase within normal limits.  PT/INR within normal limits.  TSH normal.  Pending T4.  VBG shows normal pH of 7.43.  Urinalysis shows concentrated urine with glucose and 20 ketones otherwise unremarkable.  The patient is not acidotic, and normal anion gap, low suspicion for any DKA.  She does have some ketones however reports that she has not been eating or drinking as much, could be some starvation ketosis.  Her glucose is downtrending and is now 316 from 4 1:32 liter of fluid.  Will give her her first dose of metformin today and LR 500mL to further bring this down.  After consideration of the diagnostic results and the patients response to treatment, I feel that the patient is stable for discharge home.  She is not meeting criteria for any DKA.  Low suspicion for any HHS as patient is not altered.  Liver enzymes to be mildly elevated due to fatty liver.  Weight loss may be from uncontrolled diabetes however primary care will further work this up after discussing this with him.  We  discussed the results of the labs/imaging. The plan is to take the metformin as  prescribed.  I also sent her in a glucose monitoring kit as well as send out an outpatient diabetes educator consultation.  I included multiple videos on managing diabetes and how to take her blood sugar.  I discussed with her her new medication of metformin to be taken twice daily. We discussed strict return precautions and red flag symptoms. The patient verbalized their understanding and agrees to the plan. The patient is stable and being discharged home in good condition.  Portions of this report may have been transcribed using voice recognition software. Every effort was made to ensure accuracy; however, inadvertent computerized transcription errors may be present.    Final Clinical Impression(s) / ED Diagnoses Final diagnoses:  Hyperglycemia  Pseudohyponatremia  Elevated liver enzymes    Rx / DC Orders ED Discharge Orders          Ordered    Blood Glucose Monitoring Suppl DEVI  3 times daily        07/05/22 1932    Glucose Blood (BLOOD GLUCOSE TEST STRIPS) STRP  3 times daily        07/05/22 1932    Lancet Device MISC  3 times daily        07/05/22 1932    Lancets Misc. MISC  3 times daily        07/05/22 1932    metFORMIN (GLUCOPHAGE) 500 MG tablet  2 times daily with meals        07/05/22 1932              Achille RichRansom, Saleem Coccia, PA-C 07/08/22 1045    Tanda RockersGray, Samuel A, DO 07/14/22 1538

## 2022-07-05 NOTE — ED Triage Notes (Signed)
Went to PCP office yesterday because she has been fatigue and massive loss of weight in 2 months.  Called back this morning and told her LFTs and glucose were elevated along with kidney function.

## 2022-12-09 ENCOUNTER — Ambulatory Visit: Payer: Commercial Managed Care - PPO | Admitting: Internal Medicine

## 2022-12-28 ENCOUNTER — Encounter: Payer: Self-pay | Admitting: Internal Medicine

## 2022-12-28 ENCOUNTER — Ambulatory Visit: Payer: Commercial Managed Care - PPO | Attending: Internal Medicine | Admitting: Internal Medicine

## 2022-12-28 VITALS — BP 114/60 | HR 95 | Ht 63.0 in | Wt 263.4 lb

## 2022-12-28 DIAGNOSIS — I10 Essential (primary) hypertension: Secondary | ICD-10-CM | POA: Insufficient documentation

## 2022-12-28 DIAGNOSIS — E785 Hyperlipidemia, unspecified: Secondary | ICD-10-CM | POA: Insufficient documentation

## 2022-12-28 DIAGNOSIS — R002 Palpitations: Secondary | ICD-10-CM | POA: Diagnosis not present

## 2022-12-28 DIAGNOSIS — E7849 Other hyperlipidemia: Secondary | ICD-10-CM

## 2022-12-28 DIAGNOSIS — R0789 Other chest pain: Secondary | ICD-10-CM | POA: Diagnosis not present

## 2022-12-28 NOTE — Patient Instructions (Addendum)
Medication Instructions:  Your physician recommends that you continue on your current medications as directed. Please refer to the Current Medication list given to you today.    Labwork: None  Testing/Procedures: None  Follow-Up: Your physician recommends that you schedule a follow-up appointment in: As needed.   Any Other Special Instructions Will Be Listed Below (If Applicable).     If you need a refill on your cardiac medications before your next appointment, please call your pharmacy.   

## 2022-12-28 NOTE — Progress Notes (Signed)
Cardiology Office Note  Date: 12/28/2022   ID: Debbie Goodwin, DOB Feb 26, 1971, MRN [id number]  PCP:  Debbie Nevins, MD  Cardiologist:  Marjo Bicker, MD Electrophysiologist:  None   History of Present Illness: Debbie Goodwin is a 52 y.o. female known to have fibromyalgia, HTN, HLD was referred to cardiology clinic for management of palpitations and chest pain.   Patient had palpitations lasting for 15 seconds when she was turning to the right side to sleep in her bed.  This happened around 1 month ago.  No recurrence since then.  She also has fibromyalgia and has chest pains all the time but no new chest pain developed with exertion recently.  No history of CAD.  No family history of CAD.  Does drink coffee.  No other symptoms of dizziness, lightheadedness, or syncope, presyncope and leg swelling.  Past Medical History:  Diagnosis Date   Anxiety    Fibromyalgia    GERD (gastroesophageal reflux disease)    HLD (hyperlipidemia)    Hypertension    Status post dilation of esophageal narrowing    Thyroid disease     Past Surgical History:  Procedure Laterality Date   CARPAL TUNNEL RELEASE Bilateral 2016   CHOLECYSTECTOMY     LASIK Bilateral    PLANTAR FASCIA SURGERY Right 1998    Current Outpatient Medications  Medication Sig Dispense Refill   ALPRAZolam (XANAX) 0.25 MG tablet Take 0.25 mg by mouth 3 (three) times daily as needed.     amitriptyline (ELAVIL) 25 MG tablet Take 25-50 mg by mouth at bedtime.     betamethasone dipropionate 0.05 % cream Apply topically 2 (two) times daily as needed.     Blood Glucose Monitoring Suppl DEVI 1 each by Does not apply route in the morning, at noon, and at bedtime. May substitute to any manufacturer covered by patient's insurance. 1 each 0   buPROPion (WELLBUTRIN XL) 300 MG 24 hr tablet Take 300 mg by mouth daily.     Cholecalciferol (VITAMIN D-3 PO) Take by mouth daily.     cyclobenzaprine (FLEXERIL) 10 MG tablet Take by mouth  as needed.     DULoxetine (CYMBALTA) 60 MG capsule Take 60 mg by mouth daily.     famotidine (PEPCID) 20 MG tablet Take 1 tablet (20 mg total) by mouth at bedtime. 30 tablet 3   Flaxseed, Linseed, (FLAXSEED OIL PO) Take by mouth daily.     fluconazole (DIFLUCAN) 100 MG tablet Take 100 mg by mouth as needed.     gabapentin (NEURONTIN) 300 MG capsule Take 900 mg by mouth daily.     ibuprofen (ADVIL,MOTRIN) 200 MG tablet Take 800 mg by mouth every 6 (six) hours as needed for moderate pain.     ketoconazole (NIZORAL) 2 % cream Apply topically 2 (two) times daily.     levothyroxine (SYNTHROID) 100 MCG tablet Take 100 mcg by mouth daily.     metFORMIN (GLUCOPHAGE) 500 MG tablet Take 1 tablet (500 mg total) by mouth 2 (two) times daily with a meal. 60 tablet 0   olmesartan (BENICAR) 40 MG tablet Take 40 mg by mouth daily.     Omega-3 Fatty Acids (FISH OIL) 300 MG CAPS Take 1 capsule by mouth daily.     omeprazole (PRILOSEC) 40 MG capsule Take 40 mg by mouth daily.     Oxycodone HCl 10 MG TABS Take 1 tablet by mouth every 6 (six) hours as needed (pain).     pantoprazole (PROTONIX) 40  MG tablet Take 40 mg by mouth daily.     polyvinyl alcohol (LIQUIFILM TEARS) 1.4 % ophthalmic solution Place 1 drop into both eyes as needed for dry eyes.     rOPINIRole (REQUIP) 0.5 MG tablet Take 0.5 mg by mouth as needed.     rOPINIRole (REQUIP) 1 MG tablet Take 1 mg by mouth as needed.     rOPINIRole (REQUIP) 3 MG tablet Take 3 mg by mouth daily.     simvastatin (ZOCOR) 40 MG tablet Take 40 mg by mouth daily.     Zinc Acetate, Oral, (ZINC ACETATE PO) Take by mouth daily.     No current facility-administered medications for this visit.   Allergies:  Sulfa antibiotics   Social History: The patient  reports that she has never smoked. She has never used smokeless tobacco. She reports that she does not drink alcohol and does not use drugs.   Family History: The patient's family history includes Colon polyps in her  father; Heart attack in her father and maternal grandmother; Hyperlipidemia in her father and mother; Hypertension in her father and mother; Stroke in her paternal grandfather.   ROS:  Please see the history of present illness. Otherwise, complete review of systems is positive for none  All other systems are reviewed and negative.   Physical Exam: VS:  BP 114/60   Pulse 95   Ht 5\' 3"  (1.6 m)   Wt 263 lb 6.4 oz (119.5 kg)   LMP 07/26/2020 Comment: denies possibility of pregnancy  SpO2 95%   BMI 46.66 kg/m , BMI Body mass index is 46.66 kg/m.  Wt Readings from Last 3 Encounters:  12/28/22 263 lb 6.4 oz (119.5 kg)  07/05/22 237 lb (107.5 kg)  09/18/20 271 lb (122.9 kg)    General: Patient appears comfortable at rest. HEENT: Conjunctiva and lids normal, oropharynx clear with moist mucosa. Neck: Supple, no elevated JVP or carotid bruits, no thyromegaly. Lungs: Clear to auscultation, nonlabored breathing at rest. Cardiac: Regular rate and rhythm, no S3 or significant systolic murmur, no pericardial rub. Abdomen: Soft, nontender, no hepatomegaly, bowel sounds present, no guarding or rebound. Extremities: No pitting edema, distal pulses 2+. Skin: Warm and dry. Musculoskeletal: No kyphosis. Neuropsychiatric: Alert and oriented x3, affect grossly appropriate.  Recent Labwork: 07/05/2022: ALT 66; AST 51; BUN 8; Creatinine, Ser 0.72; Hemoglobin 13.7; Platelets 203; Potassium 3.6; Sodium 131; TSH 0.642  No results found for: "CHOL", "TRIG", "HDL", "CHOLHDL", "VLDL", "LDLCALC", "LDLDIRECT"   Assessment and Plan:  Palpitations Noncardiac chest pain HTN, controlled HLD, at goal   -Palpitations occurred around 1 month ago and lasted for 15 seconds when she was turning right in her bed to sleep.  No recurrence since then.  Does benefit from event monitor at this time.  Educated to cut back on the caffeinated products.  She also has fibromyalgia and has chest pains all the time.  But no new  chest pain with exertion.  No further cardiac testing is indicated at this time.  Continue current antihypertensives, olmesartan 40 mg once daily.  Currently on simvastatin 40 mg nightly and fish oil supplements for HLD management.  I reviewed the lipid panel from 2022 that showed normal LDL 94 and mildly elevated triglycerides 172.  No repeat lipid panel is found on the chart.       Medication Adjustments/Labs and Tests Ordered: Current medicines are reviewed at length with the patient today.  Concerns regarding medicines are outlined above.    Disposition:  Follow up prn  Signed Amire Gossen Verne Spurr, MD, 12/28/2022 2:49 PM    [facility] [address] [place], [place], [place] [address]

## 2023-03-27 ENCOUNTER — Other Ambulatory Visit (HOSPITAL_COMMUNITY): Payer: Self-pay | Admitting: Internal Medicine

## 2023-03-27 DIAGNOSIS — M5416 Radiculopathy, lumbar region: Secondary | ICD-10-CM

## 2023-03-28 ENCOUNTER — Emergency Department (HOSPITAL_COMMUNITY): Payer: Commercial Managed Care - PPO

## 2023-03-28 ENCOUNTER — Other Ambulatory Visit: Payer: Self-pay

## 2023-03-28 ENCOUNTER — Encounter (HOSPITAL_COMMUNITY): Payer: Self-pay

## 2023-03-28 ENCOUNTER — Emergency Department (HOSPITAL_COMMUNITY)
Admission: EM | Admit: 2023-03-28 | Discharge: 2023-03-29 | Disposition: A | Discharge status: Home / Self Care | Payer: Commercial Managed Care - PPO | Source: Home / Self Care

## 2023-03-28 DIAGNOSIS — W19XXXA Unspecified fall, initial encounter: Secondary | ICD-10-CM | POA: Insufficient documentation

## 2023-03-28 DIAGNOSIS — E119 Type 2 diabetes mellitus without complications: Secondary | ICD-10-CM | POA: Insufficient documentation

## 2023-03-28 DIAGNOSIS — G061 Intraspinal abscess and granuloma: Secondary | ICD-10-CM | POA: Diagnosis not present

## 2023-03-28 DIAGNOSIS — J189 Pneumonia, unspecified organism: Secondary | ICD-10-CM | POA: Insufficient documentation

## 2023-03-28 DIAGNOSIS — Z7984 Long term (current) use of oral hypoglycemic drugs: Secondary | ICD-10-CM | POA: Insufficient documentation

## 2023-03-28 DIAGNOSIS — R519 Headache, unspecified: Secondary | ICD-10-CM | POA: Insufficient documentation

## 2023-03-28 DIAGNOSIS — R509 Fever, unspecified: Secondary | ICD-10-CM

## 2023-03-28 DIAGNOSIS — R937 Abnormal findings on diagnostic imaging of other parts of musculoskeletal system: Secondary | ICD-10-CM | POA: Insufficient documentation

## 2023-03-28 DIAGNOSIS — Z20822 Contact with and (suspected) exposure to covid-19: Secondary | ICD-10-CM | POA: Insufficient documentation

## 2023-03-28 DIAGNOSIS — R7881 Bacteremia: Secondary | ICD-10-CM | POA: Diagnosis not present

## 2023-03-28 LAB — CBC WITH DIFFERENTIAL/PLATELET
Abs Immature Granulocytes: 0 10*3/uL (ref 0.00–0.07)
Band Neutrophils: 1 %
Basophils Absolute: 0 10*3/uL (ref 0.0–0.1)
Basophils Relative: 0 %
Eosinophils Absolute: 0.3 10*3/uL (ref 0.0–0.5)
Eosinophils Relative: 3 %
HCT: 34.3 % — ABNORMAL LOW (ref 36.0–46.0)
Hemoglobin: 11.5 g/dL — ABNORMAL LOW (ref 12.0–15.0)
Lymphocytes Relative: 27 %
Lymphs Abs: 2.7 10*3/uL (ref 0.7–4.0)
MCH: 30 pg (ref 26.0–34.0)
MCHC: 33.5 g/dL (ref 30.0–36.0)
MCV: 89.6 fL (ref 80.0–100.0)
Monocytes Absolute: 0.5 10*3/uL (ref 0.1–1.0)
Monocytes Relative: 5 %
Neutro Abs: 6.4 10*3/uL (ref 1.7–7.7)
Neutrophils Relative %: 64 %
Platelets: 152 10*3/uL (ref 150–400)
RBC: 3.83 MIL/uL — ABNORMAL LOW (ref 3.87–5.11)
RDW: 13.3 % (ref 11.5–15.5)
WBC: 9.9 10*3/uL (ref 4.0–10.5)
nRBC: 0 % (ref 0.0–0.2)

## 2023-03-28 LAB — COMPREHENSIVE METABOLIC PANEL
ALT: 51 U/L — ABNORMAL HIGH (ref 0–44)
AST: 43 U/L — ABNORMAL HIGH (ref 15–41)
Albumin: 3.5 g/dL (ref 3.5–5.0)
Alkaline Phosphatase: 63 U/L (ref 38–126)
Anion gap: 13 (ref 5–15)
BUN: 15 mg/dL (ref 6–20)
CO2: 22 mmol/L (ref 22–32)
Calcium: 8.2 mg/dL — ABNORMAL LOW (ref 8.9–10.3)
Chloride: 94 mmol/L — ABNORMAL LOW (ref 98–111)
Creatinine, Ser: 0.75 mg/dL (ref 0.44–1.00)
GFR, Estimated: >60 mL/min (ref >60)
Glucose, Bld: 178 mg/dL — ABNORMAL HIGH (ref 70–99)
Potassium: 3 mmol/L — ABNORMAL LOW (ref 3.5–5.1)
Sodium: 129 mmol/L — ABNORMAL LOW (ref 135–145)
Total Bilirubin: 1.5 mg/dL — ABNORMAL HIGH (ref 0.0–1.2)
Total Protein: 7.3 g/dL (ref 6.5–8.1)

## 2023-03-28 LAB — TROPONIN I (HIGH SENSITIVITY): Troponin I (High Sensitivity): 26 ng/L — ABNORMAL HIGH (ref <18)

## 2023-03-28 LAB — URINALYSIS, W/ REFLEX TO CULTURE (INFECTION SUSPECTED)
Bacteria, UA: NONE SEEN
Bilirubin Urine: NEGATIVE
Glucose, UA: NEGATIVE mg/dL
Hgb urine dipstick: NEGATIVE
Ketones, ur: 20 mg/dL — AB
Leukocytes,Ua: NEGATIVE
Nitrite: NEGATIVE
Protein, ur: 100 mg/dL — AB
Specific Gravity, Urine: 1.018 (ref 1.005–1.030)
pH: 8 (ref 5.0–8.0)

## 2023-03-28 LAB — RESP PANEL BY RT-PCR (RSV, FLU A&B, COVID)  RVPGX2
Influenza A by PCR: NEGATIVE
Influenza B by PCR: NEGATIVE
Resp Syncytial Virus by PCR: NEGATIVE
SARS Coronavirus 2 by RT PCR: NEGATIVE

## 2023-03-28 LAB — LACTIC ACID, PLASMA: Lactic Acid, Venous: 1.5 mmol/L (ref 0.5–1.9)

## 2023-03-28 MED ORDER — ACETAMINOPHEN 325 MG PO TABS
650.0000 mg | ORAL_TABLET | Freq: Once | ORAL | Status: AC | PRN
  Administered 2023-03-28: 650 mg via ORAL
  Filled 2023-03-28: qty 2

## 2023-03-28 MED ORDER — LORAZEPAM 2 MG/ML IJ SOLN
1.0000 mg | Freq: Once | INTRAMUSCULAR | Status: AC
  Administered 2023-03-28: 1 mg via INTRAVENOUS
  Filled 2023-03-28: qty 1

## 2023-03-28 MED ORDER — GADOBUTROL 1 MMOL/ML IV SOLN
10.0000 mL | Freq: Once | INTRAVENOUS | Status: AC | PRN
  Administered 2023-03-28: 10 mL via INTRAVENOUS

## 2023-03-28 MED ORDER — KETOROLAC TROMETHAMINE 15 MG/ML IJ SOLN
15.0000 mg | Freq: Once | INTRAMUSCULAR | Status: AC
  Administered 2023-03-28: 15 mg via INTRAVENOUS
  Filled 2023-03-28: qty 1

## 2023-03-28 MED ORDER — SODIUM CHLORIDE 0.9 % IV BOLUS
1000.0000 mL | Freq: Once | INTRAVENOUS | Status: AC
  Administered 2023-03-28: 1000 mL via INTRAVENOUS

## 2023-03-28 NOTE — ED Triage Notes (Signed)
 Patient complaint of Short ness of breath, Temp of 102.6 at home. Stated the shortness of breath has been happening since last night  Pain to lower back and chest area. Patient stated she had a fall on Friday night. Has hx of degenerative disk. Prednisone taken PRN for her back.

## 2023-03-28 NOTE — ED Provider Notes (Signed)
  Provider Note MRN:  [id number]  Arrival date & time: 03/29/23    ED Course and Medical Decision Making  Assumed care of patient at shift change  Fever of unclear source, chronic neck pain, abnormal soft tissues on CT awaiting MRI, may need more advanced imaging.  3 AM update: Patient is well-appearing on reassessment, normal vital signs, very nontoxic.  MRI reveals fluid collection on the base of the skull to T4, etiology unclear.  Not obviously a source of infection.  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  Further imaging reveals likely lingular pneumonia.  Patient is very excited to go home, strict return precautions.  Procedures  Final Clinical Impressions(s) / ED Diagnoses     ICD-10-CM   1. Fever, unspecified fever cause  R50.9     2. Community acquired pneumonia, unspecified laterality  J18.9     3. Abnormal MRI, spine  R93.7       ED Discharge Orders          Ordered    azithromycin  (ZITHROMAX ) 250 MG tablet        03/29/23 0327    amoxicillin  (AMOXIL ) 500 MG capsule  3 times daily        03/29/23 0327              Discharge Instructions      You were evaluated in the Emergency Department and after careful evaluation, we did not find any emergent condition requiring admission or further testing in the hospital.  Your exam/testing today was overall reassuring.  Symptoms may be due to a pneumonia.  Take the amoxicillin  and azithromycin  antibiotics as directed.  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  Recommend follow-up with your neurosurgeon.  Please return to the Emergency Department if you experience any worsening of your condition.  Thank you for allowing us  to be a part of your care.       Ozell HERO. Theadore, MD [facility]  Medicine [facility] [email or web address]    Theadore Ozell HERO, MD 03/29/23 [phone]

## 2023-03-28 NOTE — ED Provider Notes (Signed)
 [place] EMERGENCY DEPARTMENT AT [facility] Provider Note   CSN: [id number] Arrival date & time: 03/28/23  1734     History  Chief Complaint  Patient presents with   Fall   Shortness of Breath   Chest Pain    Debbie Goodwin is a 52 y.o. female.  52 year old female with past medical history of diabetes, fibromyalgia, and DJD presenting to the emergency department today with primary complaints of fever, chest pain, dyspnea, and flank pain.  The patient states that this has been going on now for the past 2 days.  She states that she has had some increased urinary frequency and urgency as well as some flank pain.  She reports that she is having some pain in mL of her chest with no cough.  She has chronic neck and back pain that has been going on now for weeks.  She states that her neck pain is worse since she fell a few days ago.  She followed up with her doctor and an MRI was ordered but she has not had any imaging thus far for this.  She states that this was a mechanical fall.  She denies any bowel or bladder dysfunction and has not had any saddle anesthesia.  She came to the ER today for further evaluation due to these symptoms.   Fall Associated symptoms include chest pain and shortness of breath.  Shortness of Breath Associated symptoms: chest pain   Chest Pain Associated symptoms: shortness of breath        Home Medications Prior to Admission medications   Medication Sig Start Date End Date Taking? Authorizing Provider  ALPRAZolam  (XANAX ) 0.25 MG tablet Take 0.25 mg by mouth 3 (three) times daily as needed. 09/28/22   [provider]  amitriptyline  (ELAVIL ) 25 MG tablet Take 25-50 mg by mouth at bedtime. 05/09/22   [provider]  betamethasone  dipropionate 0.05 % cream Apply topically 2 (two) times daily as needed. 05/09/22   [provider]  Blood Glucose Monitoring Suppl DEVI 1 each by Does not apply route in the morning, at noon, and  at bedtime. May substitute to any manufacturer covered by patient's insurance. 07/05/22   Bernis Ernst, PA-C  buPROPion  (WELLBUTRIN  XL) 300 MG 24 hr tablet Take 300 mg by mouth daily.    [provider]  Cholecalciferol (VITAMIN D-3 PO) Take by mouth daily.    [provider]  cyclobenzaprine  (FLEXERIL ) 10 MG tablet Take by mouth as needed. 06/08/21   [provider]  DULoxetine  (CYMBALTA ) 60 MG capsule Take 60 mg by mouth daily.    [provider]  famotidine  (PEPCID ) 20 MG tablet Take 1 tablet (20 mg total) by mouth at bedtime. 06/25/20   Kerman Vina HERO, NP  Flaxseed, Linseed, (FLAXSEED OIL PO) Take by mouth daily.    [provider]  fluconazole  (DIFLUCAN ) 100 MG tablet Take 100 mg by mouth as needed. 06/18/22   [provider]  gabapentin  (NEURONTIN ) 300 MG capsule Take 900 mg by mouth daily.    [provider]  ibuprofen (ADVIL,MOTRIN) 200 MG tablet Take 800 mg by mouth every 6 (six) hours as needed for moderate pain.    [provider]  ketoconazole  (NIZORAL ) 2 % cream Apply topically 2 (two) times daily. 08/10/22   [provider]  levothyroxine  (SYNTHROID ) 100 MCG tablet Take 100 mcg by mouth daily. 04/06/20   [provider]  metFORMIN  (GLUCOPHAGE ) 500 MG tablet Take 1 tablet (500  mg total) by mouth 2 (two) times daily with a meal. 07/05/22   Bernis Ernst, PA-C  olmesartan  (BENICAR ) 40 MG tablet Take 40 mg by mouth daily. 06/03/20   [provider]  Omega-3 Fatty Acids (FISH OIL) 300 MG CAPS Take 1 capsule by mouth daily.    [provider]  omeprazole (PRILOSEC) 40 MG capsule Take 40 mg by mouth daily.    [provider]  Oxycodone  HCl 10 MG TABS Take 1 tablet by mouth every 6 (six) hours as needed (pain).    [provider]  pantoprazole  (PROTONIX ) 40 MG tablet Take 40 mg by mouth daily. 06/28/22   [provider]  polyvinyl alcohol  (LIQUIFILM TEARS) 1.4 %  ophthalmic solution Place 1 drop into both eyes as needed for dry eyes.    [provider]  rOPINIRole  (REQUIP ) 0.5 MG tablet Take 0.5 mg by mouth as needed. 05/22/20   [provider]  rOPINIRole  (REQUIP ) 1 MG tablet Take 1 mg by mouth as needed. 06/28/22   [provider]  rOPINIRole  (REQUIP ) 3 MG tablet Take 3 mg by mouth daily. 12/15/22   [provider]  simvastatin  (ZOCOR ) 40 MG tablet Take 40 mg by mouth daily.    [provider]  Zinc Acetate, Oral, (ZINC ACETATE PO) Take by mouth daily.    [provider]      Allergies    Sulfa antibiotics    Review of Systems   Review of Systems  Respiratory:  Positive for shortness of breath.   Cardiovascular:  Positive for chest pain.  Genitourinary:  Positive for flank pain.  All other systems reviewed and are negative.   Physical Exam Updated Vital Signs BP (!) 108/49   Pulse 95   Temp 98 F (36.7 C)   Resp 18   Ht 5' 3 (1.6 m)   Wt 125.2 kg   LMP 07/26/2020 Comment: denies possibility of pregnancy  SpO2 95%   BMI 48.89 kg/m  Physical Exam Vitals and nursing note reviewed.   Gen: NAD Eyes: PERRL, EOMI HEENT: no oropharyngeal swelling Neck: trachea midline, no meningismus Resp: clear to auscultation bilaterally Card: RRR, no murmurs, rubs, or gallops Abd: nontender, nondistended, right and left-sided CVA tenderness noted Extremities: no calf tenderness, no edema Vascular: 2+ radial pulses bilaterally, 2+ DP pulses bilaterally Neuro: No focal deficits Skin: no rashes Psyc: acting appropriately   ED Results / Procedures / Treatments   Labs (all labs ordered are listed, but only abnormal results are displayed) Labs Reviewed  COMPREHENSIVE METABOLIC PANEL - Abnormal; Notable for the following components:      Result Value   Sodium 129 (*)    Potassium 3.0 (*)    Chloride 94 (*)    Glucose, Bld 178 (*)    Calcium 8.2 (*)    AST 43 (*)    ALT 51 (*)    Total  Bilirubin 1.5 (*)    All other components within normal limits  CBC WITH DIFFERENTIAL/PLATELET - Abnormal; Notable for the following components:   RBC 3.83 (*)    Hemoglobin 11.5 (*)    HCT 34.3 (*)    All other components within normal limits  URINALYSIS, W/ REFLEX TO CULTURE (INFECTION SUSPECTED) - Abnormal; Notable for the following components:   Ketones, ur 20 (*)    Protein, ur 100 (*)    All other components within normal limits  TROPONIN I (HIGH SENSITIVITY) - Abnormal; Notable for the following components:  Troponin I (High Sensitivity) 26 (*)    All other components within normal limits  RESP PANEL BY RT-PCR (RSV, FLU A&B, COVID)  [id number]  CULTURE, BLOOD (ROUTINE X 2)  CULTURE, BLOOD (ROUTINE X 2)  LACTIC ACID, PLASMA  LACTIC ACID, PLASMA  TROPONIN I (HIGH SENSITIVITY)    EKG EKG Interpretation Date/Time:  Tuesday March 28 2023 18:35:40 EST Ventricular Rate:  110 PR Interval:  134 QRS Duration:  84 QT Interval:  332 QTC Calculation: 449 R Axis:   97  Text Interpretation: Sinus tachycardia with Premature atrial complexes Rightward axis Borderline ECG When compared with ECG of 28-Dec-2022 13:55, Premature atrial complexes are now Present Confirmed by Ula Barter [phone]) on 03/28/2023 7:23:55 PM  Radiology CT Head Wo Contrast Result Date: 03/28/2023 CLINICAL DATA:  Head trauma EXAM: CT HEAD WITHOUT CONTRAST CT CERVICAL SPINE WITHOUT CONTRAST TECHNIQUE: Multidetector CT imaging of the head and cervical spine was performed following the standard protocol without intravenous contrast. Multiplanar CT image reconstructions of the cervical spine were also generated. RADIATION DOSE REDUCTION: This exam was performed according to the departmental dose-optimization program which includes automated exposure control, adjustment of the mA and/or kV according to patient size and/or use of iterative reconstruction technique. COMPARISON:  01/06/2022 FINDINGS: CT HEAD FINDINGS Brain: No  mass,hemorrhage or extra-axial collection. Normal appearance of the parenchyma and CSF spaces. Vascular: No hyperdense vessel or unexpected vascular calcification. Skull: The visualized skull base, calvarium and extracranial soft tissues are normal. Sinuses/Orbits: No fluid levels or advanced mucosal thickening of the visualized paranasal sinuses. No mastoid or middle ear effusion. Normal orbits. Other: None. CT CERVICAL SPINE FINDINGS Alignment: No static subluxation. Facets are aligned. Occipital condyles are normally positioned. Skull base and vertebrae: No acute fracture. Soft tissues and spinal canal: There is marked thickening of the prevertebral soft tissues, measuring 15 mm at the C3 level, previously 5 mm. Disc levels: No advanced spinal canal or neural foraminal stenosis. Upper chest: No pneumothorax, pulmonary nodule or pleural effusion. Other: Normal visualized paraspinal cervical soft tissues. IMPRESSION: 1. No acute intracranial abnormality. 2. No acute fracture or static subluxation of the cervical spine. 3. Marked thickening of the prevertebral soft tissues, measuring 15 mm at the C3 level, previously 5 mm. The source or the swelling is unclear. MRI may be helpful to assess for a cervical ligamentous injury. Electronically Signed   By: Franky Stanford M.D.   On: 03/28/2023 21:34   CT Cervical Spine Wo Contrast Result Date: 03/28/2023 CLINICAL DATA:  Head trauma EXAM: CT HEAD WITHOUT CONTRAST CT CERVICAL SPINE WITHOUT CONTRAST TECHNIQUE: Multidetector CT imaging of the head and cervical spine was performed following the standard protocol without intravenous contrast. Multiplanar CT image reconstructions of the cervical spine were also generated. RADIATION DOSE REDUCTION: This exam was performed according to the departmental dose-optimization program which includes automated exposure control, adjustment of the mA and/or kV according to patient size and/or use of iterative reconstruction technique.  COMPARISON:  01/06/2022 FINDINGS: CT HEAD FINDINGS Brain: No mass,hemorrhage or extra-axial collection. Normal appearance of the parenchyma and CSF spaces. Vascular: No hyperdense vessel or unexpected vascular calcification. Skull: The visualized skull base, calvarium and extracranial soft tissues are normal. Sinuses/Orbits: No fluid levels or advanced mucosal thickening of the visualized paranasal sinuses. No mastoid or middle ear effusion. Normal orbits. Other: None. CT CERVICAL SPINE FINDINGS Alignment: No static subluxation. Facets are aligned. Occipital condyles are normally positioned. Skull base and vertebrae: No acute fracture. Soft tissues and spinal canal: There  is marked thickening of the prevertebral soft tissues, measuring 15 mm at the C3 level, previously 5 mm. Disc levels: No advanced spinal canal or neural foraminal stenosis. Upper chest: No pneumothorax, pulmonary nodule or pleural effusion. Other: Normal visualized paraspinal cervical soft tissues. IMPRESSION: 1. No acute intracranial abnormality. 2. No acute fracture or static subluxation of the cervical spine. 3. Marked thickening of the prevertebral soft tissues, measuring 15 mm at the C3 level, previously 5 mm. The source or the swelling is unclear. MRI may be helpful to assess for a cervical ligamentous injury. Electronically Signed   By: Franky Stanford M.D.   On: 03/28/2023 21:34   DG Chest 2 View Result Date: 03/28/2023 CLINICAL DATA:  Chest pain and shortness of breath, initial encounter EXAM: CHEST - 2 VIEW COMPARISON:  06/24/2013 FINDINGS: Cardiac shadow is within normal limits. Mild central vascular congestion is noted without edema. No focal infiltrate or effusion is seen. Postsurgical changes in the cervical spine are noted. IMPRESSION: Mild vascular congestion. Electronically Signed   By: Oneil Devonshire M.D.   On: 03/28/2023 21:03    Procedures Procedures    Medications Ordered in ED Medications  acetaminophen  (TYLENOL )  tablet 650 mg (650 mg Oral Given 03/28/23 2007)  sodium chloride  0.9 % bolus 1,000 mL (1,000 mLs Intravenous Bolus 03/28/23 2148)  LORazepam  (ATIVAN ) injection 1 mg (1 mg Intravenous Given 03/28/23 2248)  ketorolac  (TORADOL ) 15 MG/ML injection 15 mg (15 mg Intravenous Given 03/28/23 2337)  gadobutrol  (GADAVIST ) 1 MMOL/ML injection 10 mL (10 mLs Intravenous Contrast Given 03/28/23 2320)    ED Course/ Medical Decision Making/ A&P                                 Medical Decision Making 52 year old female with past medical history of diabetes, DJD, and fibromyalgia presents the emergency department today with fever, chest pain, and bilateral flank pain.  The patient is also complaining of pain in her neck from a fall 1 week ago.  I will further evaluate the patient here with basic labs in addition to a chest x-ray and urinalysis to evaluate for infectious etiologies.  The patient is febrile here on arrival.  Will give her Tylenol  for for fever and hydrocodone for pain.  Also obtain a COVID and flu swab on the patient as these are going around currently.  She reports having pain in her back and neck that far preceded any of these infectious symptoms so suspicion for meningitis or epidural abscess is low at this time.  She is otherwise neurovascularly intact.  I will reevaluate for ultimate disposition.  Also obtain a troponin in addition to an EKG to evaluate for myocarditis or pericarditis.  The patient's labs are largely unremarkable.  CT scan of her head is unremarkable.  CT scan of her cervical spine does show some prevertebral soft tissue swelling.  MRI is ordered for further evaluation.  The patient's urinalysis is unremarkable.  The patient did remain febrile after the initial Tylenol .  She is given Toradol  with improvement.  If there is no source for her fever found on the MRI she may require further imaging.  MRI is pending at the time of signout.  Antibiotics have not been administered yet as  there is no clear source of infection.  Based on her vital signs she is not septic at this time.  Amount and/or Complexity of Data Reviewed Labs: ordered. Radiology: ordered.  Risk OTC drugs. Prescription drug management.           Final Clinical Impression(s) / ED Diagnoses Final diagnoses:  Fever, unspecified fever cause    Rx / DC Orders ED Discharge Orders     None         Ula Prentice SAUNDERS, MD 03/28/23 2342

## 2023-03-29 ENCOUNTER — Encounter (HOSPITAL_COMMUNITY): Payer: Self-pay | Admitting: *Deleted

## 2023-03-29 ENCOUNTER — Emergency Department (HOSPITAL_COMMUNITY): Payer: Commercial Managed Care - PPO

## 2023-03-29 ENCOUNTER — Encounter (HOSPITAL_COMMUNITY): Payer: Self-pay

## 2023-03-29 ENCOUNTER — Other Ambulatory Visit: Payer: Self-pay

## 2023-03-29 ENCOUNTER — Telehealth (HOSPITAL_COMMUNITY): Payer: Self-pay | Admitting: Emergency Medicine

## 2023-03-29 ENCOUNTER — Inpatient Hospital Stay (HOSPITAL_COMMUNITY)
Admission: EM | Admit: 2023-03-29 | Discharge: 2023-04-06 | DRG: 094 | Disposition: A | Discharge status: Home Health | Payer: Commercial Managed Care - PPO | Attending: Internal Medicine | Admitting: Internal Medicine

## 2023-03-29 ENCOUNTER — Ambulatory Visit (HOSPITAL_COMMUNITY): Admission: RE | Admit: 2023-03-29 | Payer: Commercial Managed Care - PPO | Source: Ambulatory Visit

## 2023-03-29 DIAGNOSIS — R609 Edema, unspecified: Secondary | ICD-10-CM | POA: Diagnosis present

## 2023-03-29 DIAGNOSIS — B9561 Methicillin susceptible Staphylococcus aureus infection as the cause of diseases classified elsewhere: Secondary | ICD-10-CM | POA: Diagnosis present

## 2023-03-29 DIAGNOSIS — M4622 Osteomyelitis of vertebra, cervical region: Secondary | ICD-10-CM

## 2023-03-29 DIAGNOSIS — M4624 Osteomyelitis of vertebra, thoracic region: Secondary | ICD-10-CM | POA: Diagnosis present

## 2023-03-29 DIAGNOSIS — Z882 Allergy status to sulfonamides status: Secondary | ICD-10-CM

## 2023-03-29 DIAGNOSIS — E876 Hypokalemia: Secondary | ICD-10-CM | POA: Diagnosis present

## 2023-03-29 DIAGNOSIS — Z79890 Hormone replacement therapy (postmenopausal): Secondary | ICD-10-CM

## 2023-03-29 DIAGNOSIS — K5730 Diverticulosis of large intestine without perforation or abscess without bleeding: Secondary | ICD-10-CM | POA: Diagnosis present

## 2023-03-29 DIAGNOSIS — M4643 Discitis, unspecified, cervicothoracic region: Secondary | ICD-10-CM | POA: Diagnosis present

## 2023-03-29 DIAGNOSIS — G062 Extradural and subdural abscess, unspecified: Secondary | ICD-10-CM

## 2023-03-29 DIAGNOSIS — E039 Hypothyroidism, unspecified: Secondary | ICD-10-CM | POA: Diagnosis present

## 2023-03-29 DIAGNOSIS — Z8249 Family history of ischemic heart disease and other diseases of the circulatory system: Secondary | ICD-10-CM

## 2023-03-29 DIAGNOSIS — K222 Esophageal obstruction: Secondary | ICD-10-CM | POA: Diagnosis present

## 2023-03-29 DIAGNOSIS — T847XXA Infection and inflammatory reaction due to other internal orthopedic prosthetic devices, implants and grafts, initial encounter: Secondary | ICD-10-CM

## 2023-03-29 DIAGNOSIS — E871 Hypo-osmolality and hyponatremia: Secondary | ICD-10-CM | POA: Diagnosis present

## 2023-03-29 DIAGNOSIS — E1169 Type 2 diabetes mellitus with other specified complication: Secondary | ICD-10-CM | POA: Diagnosis present

## 2023-03-29 DIAGNOSIS — I10 Essential (primary) hypertension: Secondary | ICD-10-CM | POA: Diagnosis present

## 2023-03-29 DIAGNOSIS — I340 Nonrheumatic mitral (valve) insufficiency: Secondary | ICD-10-CM | POA: Diagnosis present

## 2023-03-29 DIAGNOSIS — Z6841 Body Mass Index (BMI) 40.0 and over, adult: Secondary | ICD-10-CM

## 2023-03-29 DIAGNOSIS — E785 Hyperlipidemia, unspecified: Secondary | ICD-10-CM | POA: Diagnosis present

## 2023-03-29 DIAGNOSIS — M4654 Other infective spondylopathies, thoracic region: Secondary | ICD-10-CM | POA: Diagnosis present

## 2023-03-29 DIAGNOSIS — G039 Meningitis, unspecified: Secondary | ICD-10-CM | POA: Diagnosis not present

## 2023-03-29 DIAGNOSIS — M4802 Spinal stenosis, cervical region: Secondary | ICD-10-CM | POA: Diagnosis present

## 2023-03-29 DIAGNOSIS — K219 Gastro-esophageal reflux disease without esophagitis: Secondary | ICD-10-CM | POA: Diagnosis present

## 2023-03-29 DIAGNOSIS — E119 Type 2 diabetes mellitus without complications: Secondary | ICD-10-CM | POA: Diagnosis not present

## 2023-03-29 DIAGNOSIS — Z1152 Encounter for screening for COVID-19: Secondary | ICD-10-CM

## 2023-03-29 DIAGNOSIS — W010XXA Fall on same level from slipping, tripping and stumbling without subsequent striking against object, initial encounter: Secondary | ICD-10-CM | POA: Diagnosis present

## 2023-03-29 DIAGNOSIS — G061 Intraspinal abscess and granuloma: Secondary | ICD-10-CM | POA: Diagnosis present

## 2023-03-29 DIAGNOSIS — K449 Diaphragmatic hernia without obstruction or gangrene: Secondary | ICD-10-CM | POA: Diagnosis present

## 2023-03-29 DIAGNOSIS — G894 Chronic pain syndrome: Secondary | ICD-10-CM | POA: Diagnosis present

## 2023-03-29 DIAGNOSIS — R162 Hepatomegaly with splenomegaly, not elsewhere classified: Secondary | ICD-10-CM | POA: Diagnosis present

## 2023-03-29 DIAGNOSIS — J189 Pneumonia, unspecified organism: Secondary | ICD-10-CM | POA: Diagnosis present

## 2023-03-29 DIAGNOSIS — G4733 Obstructive sleep apnea (adult) (pediatric): Secondary | ICD-10-CM

## 2023-03-29 DIAGNOSIS — F32A Depression, unspecified: Secondary | ICD-10-CM | POA: Diagnosis present

## 2023-03-29 DIAGNOSIS — F419 Anxiety disorder, unspecified: Secondary | ICD-10-CM | POA: Diagnosis present

## 2023-03-29 DIAGNOSIS — Z79899 Other long term (current) drug therapy: Secondary | ICD-10-CM

## 2023-03-29 DIAGNOSIS — Z7984 Long term (current) use of oral hypoglycemic drugs: Secondary | ICD-10-CM

## 2023-03-29 DIAGNOSIS — K7460 Unspecified cirrhosis of liver: Secondary | ICD-10-CM | POA: Diagnosis present

## 2023-03-29 DIAGNOSIS — K760 Fatty (change of) liver, not elsewhere classified: Secondary | ICD-10-CM | POA: Diagnosis present

## 2023-03-29 DIAGNOSIS — E7849 Other hyperlipidemia: Secondary | ICD-10-CM

## 2023-03-29 DIAGNOSIS — R7881 Bacteremia: Secondary | ICD-10-CM | POA: Diagnosis present

## 2023-03-29 DIAGNOSIS — M4644 Discitis, unspecified, thoracic region: Secondary | ICD-10-CM | POA: Diagnosis not present

## 2023-03-29 DIAGNOSIS — M609 Myositis, unspecified: Secondary | ICD-10-CM | POA: Diagnosis present

## 2023-03-29 DIAGNOSIS — Z823 Family history of stroke: Secondary | ICD-10-CM

## 2023-03-29 DIAGNOSIS — Z79891 Long term (current) use of opiate analgesic: Secondary | ICD-10-CM

## 2023-03-29 DIAGNOSIS — Z83438 Family history of other disorder of lipoprotein metabolism and other lipidemia: Secondary | ICD-10-CM

## 2023-03-29 DIAGNOSIS — Z83719 Family history of colon polyps, unspecified: Secondary | ICD-10-CM

## 2023-03-29 DIAGNOSIS — M797 Fibromyalgia: Secondary | ICD-10-CM | POA: Diagnosis present

## 2023-03-29 DIAGNOSIS — R591 Generalized enlarged lymph nodes: Secondary | ICD-10-CM | POA: Diagnosis present

## 2023-03-29 DIAGNOSIS — Z981 Arthrodesis status: Secondary | ICD-10-CM

## 2023-03-29 DIAGNOSIS — E66813 Obesity, class 3: Secondary | ICD-10-CM | POA: Diagnosis present

## 2023-03-29 DIAGNOSIS — Z5181 Encounter for therapeutic drug level monitoring: Secondary | ICD-10-CM

## 2023-03-29 LAB — TROPONIN I (HIGH SENSITIVITY): Troponin I (High Sensitivity): 21 ng/L — ABNORMAL HIGH (ref <18)

## 2023-03-29 LAB — CBC WITH DIFFERENTIAL/PLATELET
Abs Immature Granulocytes: 0.05 10*3/uL (ref 0.00–0.07)
Basophils Absolute: 0 10*3/uL (ref 0.0–0.1)
Basophils Relative: 0 %
Eosinophils Absolute: 0 10*3/uL (ref 0.0–0.5)
Eosinophils Relative: 0 %
HCT: 31.6 % — ABNORMAL LOW (ref 36.0–46.0)
Hemoglobin: 10.5 g/dL — ABNORMAL LOW (ref 12.0–15.0)
Immature Granulocytes: 1 %
Lymphocytes Relative: 17 %
Lymphs Abs: 1.5 10*3/uL (ref 0.7–4.0)
MCH: 29.3 pg (ref 26.0–34.0)
MCHC: 33.2 g/dL (ref 30.0–36.0)
MCV: 88.3 fL (ref 80.0–100.0)
Monocytes Absolute: 1.1 10*3/uL — ABNORMAL HIGH (ref 0.1–1.0)
Monocytes Relative: 12 %
Neutro Abs: 6.2 10*3/uL (ref 1.7–7.7)
Neutrophils Relative %: 70 %
Platelets: 131 10*3/uL — ABNORMAL LOW (ref 150–400)
RBC: 3.58 MIL/uL — ABNORMAL LOW (ref 3.87–5.11)
RDW: 13 % (ref 11.5–15.5)
WBC: 8.9 10*3/uL (ref 4.0–10.5)
nRBC: 0 % (ref 0.0–0.2)

## 2023-03-29 LAB — COMPREHENSIVE METABOLIC PANEL
ALT: 50 U/L — ABNORMAL HIGH (ref 0–44)
AST: 42 U/L — ABNORMAL HIGH (ref 15–41)
Albumin: 3.3 g/dL — ABNORMAL LOW (ref 3.5–5.0)
Alkaline Phosphatase: 60 U/L (ref 38–126)
Anion gap: 12 (ref 5–15)
BUN: 12 mg/dL (ref 6–20)
CO2: 22 mmol/L (ref 22–32)
Calcium: 8 mg/dL — ABNORMAL LOW (ref 8.9–10.3)
Chloride: 97 mmol/L — ABNORMAL LOW (ref 98–111)
Creatinine, Ser: 0.57 mg/dL (ref 0.44–1.00)
GFR, Estimated: >60 mL/min (ref >60)
Glucose, Bld: 193 mg/dL — ABNORMAL HIGH (ref 70–99)
Potassium: 2.7 mmol/L — CL (ref 3.5–5.1)
Sodium: 131 mmol/L — ABNORMAL LOW (ref 135–145)
Total Bilirubin: 1.3 mg/dL — ABNORMAL HIGH (ref 0.0–1.2)
Total Protein: 6.9 g/dL (ref 6.5–8.1)

## 2023-03-29 LAB — BLOOD CULTURE ID PANEL (REFLEXED) - BCID2

## 2023-03-29 LAB — C-REACTIVE PROTEIN: CRP: 21.8 mg/dL — ABNORMAL HIGH (ref <1.0)

## 2023-03-29 LAB — MAGNESIUM: Magnesium: 1.6 mg/dL — ABNORMAL LOW (ref 1.7–2.4)

## 2023-03-29 LAB — LACTIC ACID, PLASMA
Lactic Acid, Venous: 1.4 mmol/L (ref 0.5–1.9)
Lactic Acid, Venous: 1.5 mmol/L (ref 0.5–1.9)

## 2023-03-29 LAB — SEDIMENTATION RATE: Sed Rate: 55 mm/h — ABNORMAL HIGH (ref 0–22)

## 2023-03-29 LAB — CBG MONITORING, ED: Glucose-Capillary: 167 mg/dL — ABNORMAL HIGH (ref 70–99)

## 2023-03-29 LAB — PROCALCITONIN: Procalcitonin: 1.44 ng/mL

## 2023-03-29 MED ORDER — AZITHROMYCIN 250 MG PO TABS: 250.0000 mg | ORAL_TABLET | Freq: Every day | ORAL | 0 refills | Status: DC

## 2023-03-29 MED ORDER — POTASSIUM CHLORIDE CRYS ER 20 MEQ PO TBCR
40.0000 meq | EXTENDED_RELEASE_TABLET | Freq: Four times a day (QID) | ORAL | Status: AC
Start: 2023-03-29 — End: 2023-03-30
  Administered 2023-03-29 – 2023-03-30 (×2): 40 meq via ORAL
  Filled 2023-03-29 (×2): qty 2

## 2023-03-29 MED ORDER — MAGNESIUM SULFATE 2 GM/50ML IV SOLN
2.0000 g | Freq: Once | INTRAVENOUS | Status: AC
  Administered 2023-03-29: 2 g via INTRAVENOUS
  Filled 2023-03-29: qty 50

## 2023-03-29 MED ORDER — AMITRIPTYLINE HCL 25 MG PO TABS
25.0000 mg | ORAL_TABLET | Freq: Every day | ORAL | Status: DC
  Administered 2023-03-29: 25 mg via ORAL
  Administered 2023-03-30 – 2023-04-05 (×7): 50 mg via ORAL
  Filled 2023-03-29 (×7): qty 2
  Filled 2023-03-29: qty 1
  Filled 2023-03-29: qty 2

## 2023-03-29 MED ORDER — IOHEXOL 300 MG/ML  SOLN
100.0000 mL | Freq: Once | INTRAMUSCULAR | Status: AC | PRN
Start: 2023-03-29 — End: 2023-03-29
  Administered 2023-03-29: 100 mL via INTRAVENOUS

## 2023-03-29 MED ORDER — SODIUM CHLORIDE 0.9 % IV SOLN
2.0000 g | Freq: Once | INTRAVENOUS | Status: AC
Start: 2023-03-29 — End: 2023-03-29
  Administered 2023-03-29: 2 g via INTRAVENOUS
  Filled 2023-03-29: qty 12.5

## 2023-03-29 MED ORDER — FLUCONAZOLE 150 MG PO TABS: 150.0000 mg | ORAL_TABLET | ORAL | 0 refills | Status: DC | PRN

## 2023-03-29 MED ORDER — SODIUM CHLORIDE 0.9 % IV SOLN
2.0000 g | Freq: Once | INTRAVENOUS | Status: AC
  Administered 2023-03-29: 2 g via INTRAVENOUS
  Filled 2023-03-29: qty 20

## 2023-03-29 MED ORDER — VANCOMYCIN HCL 2000 MG/400ML IV SOLN
2000.0000 mg | Freq: Once | INTRAVENOUS | Status: AC
  Administered 2023-03-29: 2000 mg via INTRAVENOUS
  Filled 2023-03-29: qty 400

## 2023-03-29 MED ORDER — SODIUM CHLORIDE 0.9 % IV BOLUS
1000.0000 mL | Freq: Once | INTRAVENOUS | Status: AC
  Administered 2023-03-29: 1000 mL via INTRAVENOUS

## 2023-03-29 MED ORDER — POTASSIUM CHLORIDE 20 MEQ PO PACK
40.0000 meq | PACK | Freq: Once | ORAL | Status: AC
  Administered 2023-03-29: 40 meq via ORAL
  Filled 2023-03-29: qty 2

## 2023-03-29 MED ORDER — INSULIN ASPART 100 UNIT/ML IJ SOLN
0.0000 [IU] | Freq: Three times a day (TID) | INTRAMUSCULAR | Status: DC
Start: 2023-03-30 — End: 2023-04-06
  Administered 2023-03-30 (×2): 2 [IU] via SUBCUTANEOUS
  Administered 2023-03-30: 3 [IU] via SUBCUTANEOUS
  Administered 2023-03-31 – 2023-04-02 (×7): 2 [IU] via SUBCUTANEOUS
  Administered 2023-04-03: 3 [IU] via SUBCUTANEOUS
  Administered 2023-04-03 – 2023-04-06 (×5): 2 [IU] via SUBCUTANEOUS
  Filled 2023-03-29 (×2): qty 1

## 2023-03-29 MED ORDER — ACETAMINOPHEN 650 MG RE SUPP: 650.0000 mg | Freq: Four times a day (QID) | RECTAL | Status: DC | PRN

## 2023-03-29 MED ORDER — ACETAMINOPHEN 325 MG PO TABS
650.0000 mg | ORAL_TABLET | Freq: Four times a day (QID) | ORAL | Status: DC | PRN
  Administered 2023-03-30 – 2023-03-31 (×5): 650 mg via ORAL
  Filled 2023-03-29 (×5): qty 2

## 2023-03-29 MED ORDER — ALBUTEROL SULFATE (2.5 MG/3ML) 0.083% IN NEBU: 2.5000 mg | INHALATION_SOLUTION | RESPIRATORY_TRACT | Status: DC | PRN

## 2023-03-29 MED ORDER — INSULIN ASPART 100 UNIT/ML IJ SOLN: 0.0000 [IU] | Freq: Every day | INTRAMUSCULAR | Status: DC

## 2023-03-29 MED ORDER — HYDRALAZINE HCL 20 MG/ML IJ SOLN: 5.0000 mg | INTRAMUSCULAR | Status: DC | PRN

## 2023-03-29 MED ORDER — ACETAMINOPHEN 500 MG PO TABS
1000.0000 mg | ORAL_TABLET | Freq: Once | ORAL | Status: AC
  Administered 2023-03-29: 1000 mg via ORAL
  Filled 2023-03-29: qty 2

## 2023-03-29 MED ORDER — BISACODYL 10 MG RE SUPP: 10.0000 mg | Freq: Every day | RECTAL | Status: DC | PRN

## 2023-03-29 MED ORDER — SODIUM CHLORIDE 0.9 % IV SOLN: 2.0000 g | INTRAVENOUS | Status: DC

## 2023-03-29 MED ORDER — AZITHROMYCIN 250 MG PO TABS: ORAL_TABLET | ORAL | 0 refills | Status: DC

## 2023-03-29 MED ORDER — SIMVASTATIN 20 MG PO TABS
40.0000 mg | ORAL_TABLET | Freq: Every day | ORAL | Status: DC
Start: 2023-03-30 — End: 2023-04-06
  Administered 2023-03-30 – 2023-04-05 (×7): 40 mg via ORAL
  Filled 2023-03-29 (×8): qty 2

## 2023-03-29 MED ORDER — DULOXETINE HCL 60 MG PO CPEP
60.0000 mg | ORAL_CAPSULE | Freq: Every day | ORAL | Status: DC
  Administered 2023-03-30 – 2023-04-06 (×8): 60 mg via ORAL
  Filled 2023-03-29: qty 2
  Filled 2023-03-29 (×7): qty 1

## 2023-03-29 MED ORDER — IRBESARTAN 75 MG PO TABS
37.5000 mg | ORAL_TABLET | Freq: Every day | ORAL | Status: DC
  Administered 2023-03-30 – 2023-04-06 (×8): 37.5 mg via ORAL
  Filled 2023-03-29 (×8): qty 1

## 2023-03-29 MED ORDER — LEVOTHYROXINE SODIUM 100 MCG PO TABS
100.0000 ug | ORAL_TABLET | Freq: Every day | ORAL | Status: DC
  Administered 2023-03-30 – 2023-04-06 (×8): 100 ug via ORAL
  Filled 2023-03-29 (×6): qty 1
  Filled 2023-03-29: qty 2
  Filled 2023-03-29: qty 1

## 2023-03-29 MED ORDER — BUPROPION HCL ER (XL) 150 MG PO TB24
300.0000 mg | ORAL_TABLET | Freq: Every day | ORAL | Status: DC
  Administered 2023-03-30 – 2023-04-06 (×8): 300 mg via ORAL
  Filled 2023-03-29 (×9): qty 2

## 2023-03-29 MED ORDER — GABAPENTIN 300 MG PO CAPS
900.0000 mg | ORAL_CAPSULE | Freq: Every day | ORAL | Status: DC
  Administered 2023-03-30 – 2023-04-06 (×8): 900 mg via ORAL
  Filled 2023-03-29 (×8): qty 3

## 2023-03-29 MED ORDER — AMOXICILLIN 500 MG PO CAPS: 500.0000 mg | ORAL_CAPSULE | Freq: Three times a day (TID) | ORAL | 0 refills | Status: DC

## 2023-03-29 MED ORDER — OXYCODONE HCL 5 MG PO TABS
5.0000 mg | ORAL_TABLET | ORAL | Status: DC | PRN
  Administered 2023-03-29 – 2023-04-02 (×7): 5 mg via ORAL
  Filled 2023-03-29 (×8): qty 1

## 2023-03-29 MED ORDER — ROPINIROLE HCL 1 MG PO TABS
3.0000 mg | ORAL_TABLET | Freq: Every day | ORAL | Status: DC
  Administered 2023-03-30 – 2023-04-06 (×8): 3 mg via ORAL
  Filled 2023-03-29 (×10): qty 3

## 2023-03-29 MED ORDER — VANCOMYCIN HCL IN DEXTROSE 1-5 GM/200ML-% IV SOLN
1000.0000 mg | Freq: Two times a day (BID) | INTRAVENOUS | Status: DC
  Administered 2023-03-30: 1000 mg via INTRAVENOUS
  Filled 2023-03-29: qty 200

## 2023-03-29 MED ORDER — POTASSIUM CHLORIDE IN NACL 20-0.9 MEQ/L-% IV SOLN
Freq: Once | INTRAVENOUS | Status: AC
  Filled 2023-03-29: qty 1000

## 2023-03-29 MED ORDER — ALPRAZOLAM 0.5 MG PO TABS
0.2500 mg | ORAL_TABLET | Freq: Three times a day (TID) | ORAL | Status: DC | PRN
  Administered 2023-03-29 – 2023-04-05 (×3): 0.25 mg via ORAL
  Filled 2023-03-29 (×4): qty 1

## 2023-03-29 MED ORDER — MORPHINE SULFATE (PF) 2 MG/ML IV SOLN
2.0000 mg | INTRAVENOUS | Status: DC | PRN
  Administered 2023-03-30 – 2023-03-31 (×9): 2 mg via INTRAVENOUS
  Filled 2023-03-29 (×9): qty 1

## 2023-03-29 MED ORDER — POTASSIUM CHLORIDE CRYS ER 20 MEQ PO TBCR: 40.0000 meq | EXTENDED_RELEASE_TABLET | Freq: Four times a day (QID) | ORAL | Status: DC

## 2023-03-29 MED ORDER — VANCOMYCIN HCL IN DEXTROSE 1-5 GM/200ML-% IV SOLN
1000.0000 mg | Freq: Once | INTRAVENOUS | Status: AC
Start: 2023-03-29 — End: 2023-03-29
  Administered 2023-03-29: 1000 mg via INTRAVENOUS
  Filled 2023-03-29: qty 200

## 2023-03-29 MED ORDER — POLYETHYLENE GLYCOL 3350 17 G PO PACK
17.0000 g | PACK | Freq: Every day | ORAL | Status: DC | PRN
Start: 2023-03-29 — End: 2023-04-06
  Administered 2023-04-01 – 2023-04-02 (×2): 17 g via ORAL
  Filled 2023-03-29 (×2): qty 1

## 2023-03-29 MED ORDER — PANTOPRAZOLE SODIUM 40 MG PO TBEC
40.0000 mg | DELAYED_RELEASE_TABLET | Freq: Every day | ORAL | Status: DC
Start: 2023-03-29 — End: 2023-04-06
  Administered 2023-03-29 – 2023-04-06 (×9): 40 mg via ORAL
  Filled 2023-03-29 (×9): qty 1

## 2023-03-29 NOTE — H&P (Addendum)
 [facility] H&P   Patient Demographics:    Debbie Goodwin, is a 53 y.o. female  MRN: [id number]   DOB - 1970-12-02  Admit Date - 03/29/2023  Outpatient Primary MD for the patient is Bertell Satterfield, MD  Referring MD/NP/PA: Dr Purvis  Outpatient Specialists: Surgery Dr. Mavis    Chief Complaint  Patient presents with   Abnormal Lab      HPI:    Debbie Goodwin  is a 53 y.o. female, with past medical history of obesity, hypertension, fibromyalgia, hyperlipidemia, diabetes mellitus, chronic cervical radiculopathy, status post decompression surgery by Dr. Mavis in 2022, patient currently on due to lumbar steroid injection every 49-month by Dr. Darlis, most recent in September 2024. -Patient presents to ED today as she was called back for positive blood cultures, patient had an ED visit yesterday, secondary to complaints of a fever, patient has chronic neck pain, lower back pain, reports her chronic back pain at baseline, workup yesterday was significant for fluid collection around cervical spine area, for which she had MRI cervical spine obtained which did show fluid collection at the base of the skull extending into the cervical spine surrounding area, ED discussed with neurosurgery yesterday, who did not feel this fluid has to do with her fever, CT chest-abdomen-pelvis has been with evidence of small opacity in the lingula area with possible pneumonia versus atelectasis, patient was discharged on p.o. antibiotics, but her blood culture 3 out of 4 bottles came back significant for gram-positive cocci, she was called back to ED, ED physician discussed with neurosurgery again who still felt her cervical fluid collection and related to infection, hospitalist consulted to admit for her bacteremia, upon further questioning, patient reports she had a fall at week as she tripped on her dog's leash, she does  report right lower back pain over last few days as well, but other that she denies any new complaints, reports her cervical spine pain is chronic without significant worsening, she denies any other new focal deficits, tingling, numbness no dysuria, no urine or stool incontinence-. -In ED her workup significant for glucose at 193, sodium 131, potassium of 2.7, lactic acid within normal limit, and [facility] hospitalist consulted to admit    Review of systems:      A full 10 point Review of Systems was done, except as stated above, all other Review of Systems were negative.   With Past History of the following :    Past Medical History:  Diagnosis Date   Anxiety    Fibromyalgia    GERD (gastroesophageal reflux disease)    HLD (hyperlipidemia)    Hypertension    Status post dilation of esophageal narrowing    Thyroid  disease       Past Surgical History:  Procedure Laterality Date   CARPAL TUNNEL RELEASE Bilateral 2016   CHOLECYSTECTOMY     LASIK Bilateral    PLANTAR  FASCIA SURGERY Right 1998      Social History:     Social History   Tobacco Use   Smoking status: Never   Smokeless tobacco: Never  Substance Use Topics   Alcohol  use: No        Family History :     Family History  Problem Relation Age of Onset   Hypertension Mother    Hyperlipidemia Mother    Colon polyps Father    Heart attack Father    Hypertension Father    Hyperlipidemia Father    Heart attack Maternal Grandmother    Stroke Paternal Grandfather    Colon cancer Neg Hx    Esophageal cancer Neg Hx      Home Medications:   Prior to Admission medications   Medication Sig Start Date End Date Taking? Authorizing Provider  furosemide  (LASIX ) 40 MG tablet Take 40 mg by mouth 2 (two) times daily. 03/06/23  Yes [provider]  metFORMIN  (GLUCOPHAGE ) 1000 MG tablet Take 1,000 mg by mouth 2 (two) times daily. 01/31/23  Yes [provider]  ALPRAZolam  (XANAX ) 0.25 MG tablet Take 0.25  mg by mouth 3 (three) times daily as needed. 09/28/22   [provider]  amitriptyline  (ELAVIL ) 25 MG tablet Take 25-50 mg by mouth at bedtime. 05/09/22   [provider]  amoxicillin  (AMOXIL ) 500 MG capsule Take 1 capsule (500 mg total) by mouth 3 (three) times daily for 7 days. 03/29/23 04/05/23  Theadore Ozell HERO, MD  amoxicillin  (AMOXIL ) 500 MG capsule Take 1 capsule (500 mg total) by mouth 3 (three) times daily. 03/29/23   Cleotilde Rogue, MD  azithromycin  (ZITHROMAX  Z-PAK) 250 MG tablet Take 1 tablet (250 mg total) by mouth daily. 500mg  PO day 1, then 250mg  PO days 205 03/29/23   Cleotilde Rogue, MD  azithromycin  (ZITHROMAX ) 250 MG tablet Take 2 tablets together on the first day, then 1 every day until finished. 03/29/23   Theadore Ozell HERO, MD  betamethasone  dipropionate 0.05 % cream Apply topically 2 (two) times daily as needed. 05/09/22   [provider]  Blood Glucose Monitoring Suppl DEVI 1 each by Does not apply route in the morning, at noon, and at bedtime. May substitute to any manufacturer covered by patient's insurance. 07/05/22   Bernis Ernst, PA-C  buPROPion  (WELLBUTRIN  XL) 300 MG 24 hr tablet Take 300 mg by mouth daily.    [provider]  Cholecalciferol (VITAMIN D-3 PO) Take by mouth daily.    [provider]  cyclobenzaprine  (FLEXERIL ) 10 MG tablet Take by mouth as needed. 06/08/21   [provider]  DULoxetine  (CYMBALTA ) 60 MG capsule Take 60 mg by mouth daily.    [provider]  famotidine  (PEPCID ) 20 MG tablet Take 1 tablet (20 mg total) by mouth at bedtime. 06/25/20   Kerman Vina HERO, NP  Flaxseed, Linseed, (FLAXSEED OIL PO) Take by mouth daily.    [provider]  fluconazole  (DIFLUCAN ) 150 MG tablet Take 1 tablet (150 mg total) by mouth every three (3) days as needed (yeast infection treatment/prevention). 03/29/23   Theadore Ozell HERO, MD  gabapentin  (NEURONTIN ) 300 MG capsule Take 900 mg by mouth daily.    [provider]  ibuprofen (ADVIL,MOTRIN) 200 MG tablet Take 800 mg by mouth every 6 (six) hours as needed for moderate pain.    [provider]  ketoconazole  (NIZORAL ) 2 % cream Apply topically 2 (two) times daily. 08/10/22   [provider]  levothyroxine  (  SYNTHROID ) 100 MCG tablet Take 100 mcg by mouth daily. 04/06/20   [provider]  methylPREDNISolone (MEDROL DOSEPAK) 4 MG TBPK tablet Take 4 mg by mouth as directed. 03/24/23   [provider]  olmesartan  (BENICAR ) 40 MG tablet Take 40 mg by mouth daily. 06/03/20   [provider]  Omega-3 Fatty Acids (FISH OIL) 300 MG CAPS Take 1 capsule by mouth daily.    [provider]  omeprazole (PRILOSEC) 40 MG capsule Take 40 mg by mouth daily.    [provider]  Oxycodone  HCl 10 MG TABS Take 1 tablet by mouth every 6 (six) hours as needed (pain).    [provider]  pantoprazole  (PROTONIX ) 40 MG tablet Take 40 mg by mouth daily. 06/28/22   [provider]  polyvinyl alcohol  (LIQUIFILM TEARS) 1.4 % ophthalmic solution Place 1 drop into both eyes as needed for dry eyes.    [provider]  rOPINIRole  (REQUIP ) 0.5 MG tablet Take 0.5 mg by mouth as needed. 05/22/20   [provider]  rOPINIRole  (REQUIP ) 1 MG tablet Take 1 mg by mouth as needed. 06/28/22   [provider]  rOPINIRole  (REQUIP ) 3 MG tablet Take 3 mg by mouth daily. 12/15/22   [provider]  simvastatin  (ZOCOR ) 40 MG tablet Take 40 mg by mouth daily.    [provider]  Zinc Acetate, Oral, (ZINC ACETATE PO) Take by mouth daily.    [provider]     Allergies:     Allergies  Allergen Reactions   Sulfa Antibiotics Hives     Physical Exam:   Vitals  Blood pressure 137/65, pulse 83, temperature 98.9 F (37.2 C), temperature source Oral, resp. rate (!) 22, height 5' 3 (1.6 m), weight 125.2 kg, last menstrual period 07/26/2020, SpO2 94%.   1. General Developed  female, laying in bed, no apparent distress  2. Normal affect and insight, Not Suicidal or Homicidal, Awake Alert, Oriented X 3.  3. No F.N deficits, ALL C.Nerves Intact, Strength 5/5 all 4 extremities, Sensation intact all 4 extremities, Plantars down going.  4. Ears and Eyes appear Normal, Conjunctivae clear, PERRLA. Moist Oral Mucosa.  5. Supple Neck, No JVD, No cervical lymphadenopathy appriciated, No Carotid Bruits.  6. Symmetrical Chest wall movement, Good air movement bilaterally, CTAB.  7. RRR, No Gallops, Rubs or Murmurs, No Parasternal Heave.  8. Positive Bowel Sounds, Abdomen Soft, No tenderness, No organomegaly appriciated,No rebound -guarding or rigidity.  9.  No Cyanosis, Normal Skin Turgor, No Skin Rash or Bruise.  10. Good muscle tone,  joints appear normal , no effusions, Normal ROM.  She has lower back tenderness to palpation in the right area.    Data Review:    CBC Recent Labs  Lab 03/28/23 2053 03/29/23 1729  WBC 9.9 8.9  HGB 11.5* 10.5*  HCT 34.3* 31.6*  PLT 152 131*  MCV 89.6 88.3  MCH 30.0 29.3  MCHC 33.5 33.2  RDW 13.3 13.0  LYMPHSABS 2.7 1.5  MONOABS 0.5 1.1*  EOSABS 0.3 0.0  BASOSABS 0.0 0.0   ------------------------------------------------------------------------------------------------------------------  Chemistries  Recent Labs  Lab 03/28/23 2053 03/29/23 1729  NA 129* 131*  K 3.0* 2.7*  CL 94* 97*  CO2 22 22  GLUCOSE 178* 193*  BUN 15 12  CREATININE 0.75 0.57  CALCIUM 8.2* 8.0*  AST 43* 42*  ALT 51* 50*  ALKPHOS 63 60  BILITOT 1.5* 1.3*   ------------------------------------------------------------------------------------------------------------------ estimated creatinine clearance is 104.6 mL/min (  by C-G formula based on SCr of 0.57 mg/dL). ------------------------------------------------------------------------------------------------------------------ No results for input(s): TSH, T4TOTAL, T3FREE, THYROIDAB in  the last 72 hours.  Invalid input(s): FREET3  Coagulation profile No results for input(s): INR, PROTIME in the last 168 hours. ------------------------------------------------------------------------------------------------------------------- No results for input(s): DDIMER in the last 72 hours. -------------------------------------------------------------------------------------------------------------------  Cardiac Enzymes No results for input(s): CKMB, TROPONINI, MYOGLOBIN in the last 168 hours.  Invalid input(s): CK ------------------------------------------------------------------------------------------------------------------ No results found for: BNP   ---------------------------------------------------------------------------------------------------------------  Urinalysis    Component Value Date/Time   COLORURINE YELLOW 03/28/2023 2141   APPEARANCEUR CLEAR 03/28/2023 2141   LABSPEC 1.018 03/28/2023 2141   PHURINE 8.0 03/28/2023 2141   GLUCOSEU NEGATIVE 03/28/2023 2141   HGBUR NEGATIVE 03/28/2023 2141   BILIRUBINUR NEGATIVE 03/28/2023 2141   KETONESUR 20 (A) 03/28/2023 2141   PROTEINUR 100 (A) 03/28/2023 2141   NITRITE NEGATIVE 03/28/2023 2141   LEUKOCYTESUR NEGATIVE 03/28/2023 2141    ----------------------------------------------------------------------------------------------------------------   Imaging Results:    DG Chest Portable 1 View Result Date: 03/29/2023 CLINICAL DATA:  Known positive blood cultures EXAM: PORTABLE CHEST 1 VIEW COMPARISON:  CT from earlier in the same day. FINDINGS: Cardiac shadow is within normal limits. Mild vascular congestion is again seen. No focal confluent infiltrate is noted. No bony abnormality is seen. Surgical changes are noted in the cervical spine. IMPRESSION: Stable vascular congestion.  No focal infiltrate is seen. Electronically Signed   By: Oneil Devonshire M.D.   On: 03/29/2023 18:40   CT CHEST ABDOMEN  PELVIS W CONTRAST Result Date: 03/29/2023 CLINICAL DATA:  Sepsis. Shortness of breath. Patient reports recent fall. EXAM: CT CHEST, ABDOMEN, AND PELVIS WITH CONTRAST TECHNIQUE: Multidetector CT imaging of the chest, abdomen and pelvis was performed following the standard protocol during bolus administration of intravenous contrast. RADIATION DOSE REDUCTION: This exam was performed according to the departmental dose-optimization program which includes automated exposure control, adjustment of the mA and/or kV according to patient size and/or use of iterative reconstruction technique. CONTRAST:  OMNIPAQUE  IOHEXOL  300 MG/ML  SOLN COMPARISON:  Chest radiograph yesterday FINDINGS: CT CHEST FINDINGS Cardiovascular: Normal caliber thoracic aorta heart is normal in size. Trace pericardial effusion. No central pulmonary embolus on this exam not tailored to pulmonary arteries ses mint. Mediastinum/Nodes: Cervical prevertebral soft tissue fluid and edema extends into the upper thorax posterior to the esophagus. There is no esophageal wall thickening or inflammation. No mediastinal or hilar adenopathy. No thyroid  nodule. Lungs/Pleura: Trace pleural effusions. Dependent opacities in the right greater than left lower lobe. Minimal ill-defined patchy opacity in the lingula. Benign calcified granuloma in the right upper lobe. Musculoskeletal: There are no acute or suspicious osseous abnormalities. CT ABDOMEN PELVIS FINDINGS Hepatobiliary: The liver is enlarged spanning 21.7 cm cranial caudal. Diffuse hepatic steatosis. Marked nodular contours suspicious for cirrhosis. No focal liver abnormality. Gallbladder physiologically distended, no calcified stone. No biliary dilatation. Pancreas: No ductal dilatation or inflammation. Spleen: Enlarged, 15.1 cm AP.  No focal abnormality. Adrenals/Urinary Tract: Normal adrenal glands. No hydronephrosis. No evidence of focal renal abnormality or stone. Excreted contrast in the urinary  bladder, equivocal bladder wall thickening. Stomach/Bowel: Lack of enteric contrast and motion limits bowel assessment patulous distal esophagus. Scattered fluid within nondilated small bowel. There may be mild small bowel wall thickening and edema in the central small bowel, for example coronal series 4, image 71. Mixed liquid and solid stool in the colon. No pericolonic edema. Vascular/Lymphatic: Aortic atherosclerosis without aneurysm. The portal vein is patent. Periportal adenopathy including a  12 mm porta hepatis node series 4, image 59. Reproductive: Retroverted uterus.  No adnexal mass. Other: No ascites or free air. Small fat containing umbilical hernia. Musculoskeletal: Lower lumbar facet hypertrophy. There are no acute or suspicious osseous abnormalities. IMPRESSION: 1. Trace pleural effusions with dependent opacities in the right greater than left lower lobes, likely atelectasis. Minimal ill-defined patchy opacity in the lingula, may be atelectasis or pneumonia. 2. Hepatomegaly with hepatic steatosis. Nodular contours suspicious for cirrhosis. Splenomegaly. 3. Periportal adenopathy, likely reactive. 4. Possible mild small bowel wall thickening and edema in the central small bowel, can be seen with enteritis. Mixed liquid and solid stool in the. 5. Cervical prevertebral soft tissue fluid and edema extends into the upper thorax posterior to the esophagus at the thoracic inlet. Etiology is indeterminate. Aortic Atherosclerosis (ICD10-I70.0). Electronically Signed   By: Andrea Gasman M.D.   On: 03/29/2023 03:21   MR Cervical Spine W and Wo Contrast Result Date: 03/29/2023 CLINICAL DATA:  Prevertebral edema EXAM: MRI CERVICAL SPINE WITHOUT AND WITH CONTRAST TECHNIQUE: Multiplanar and multiecho pulse sequences of the cervical spine, to include the craniocervical junction and cervicothoracic junction, were obtained without and with intravenous contrast. CONTRAST:  10mL GADAVIST  GADOBUTROL  1 MMOL/ML IV SOLN  COMPARISON:  11/24/2020 FINDINGS: Alignment: Physiologic. Vertebrae: No fracture, evidence of discitis, or bone lesion. C4-7 ACDF. No evidence of ligamentous injury. Cord: Normal signal and morphology. Posterior Fossa, vertebral arteries, paraspinal tissues: Prevertebral effusion extending from the skull base inferiorly to at least T4. Maximum thickness is approximately 13 mm at C3. Disc levels: C1-2: Unremarkable. C2-3: Normal disc space and facet joints. There is no spinal canal stenosis. No neural foraminal stenosis. C3-4: Left-greater-than-right facet hypertrophy and mild uncovertebral spurring. There is no spinal canal stenosis. Mild right moderate left neural foraminal stenosis. C4-5: ACDF. There is no spinal canal stenosis. No neural foraminal stenosis. C5-6: ACDF. There is no spinal canal stenosis. No neural foraminal stenosis. C6-7: ACDF. There is no spinal canal stenosis. No neural foraminal stenosis. C7-T1: Normal disc space and facet joints. There is no spinal canal stenosis. No neural foraminal stenosis. IMPRESSION: 1. Prevertebral effusion extending from the skull base inferiorly to at least T4. Maximum thickness is approximately 13 mm at C3. The etiology remains unclear. 2. C4-7 ACDF without residual spinal canal or neural foraminal stenosis. 3. Mild right and moderate left C3-4 neural foraminal stenosis. Electronically Signed   By: Franky Stanford M.D.   On: 03/29/2023 00:40   CT Head Wo Contrast Result Date: 03/28/2023 CLINICAL DATA:  Head trauma EXAM: CT HEAD WITHOUT CONTRAST CT CERVICAL SPINE WITHOUT CONTRAST TECHNIQUE: Multidetector CT imaging of the head and cervical spine was performed following the standard protocol without intravenous contrast. Multiplanar CT image reconstructions of the cervical spine were also generated. RADIATION DOSE REDUCTION: This exam was performed according to the departmental dose-optimization program which includes automated exposure control, adjustment of the mA  and/or kV according to patient size and/or use of iterative reconstruction technique. COMPARISON:  01/06/2022 FINDINGS: CT HEAD FINDINGS Brain: No mass,hemorrhage or extra-axial collection. Normal appearance of the parenchyma and CSF spaces. Vascular: No hyperdense vessel or unexpected vascular calcification. Skull: The visualized skull base, calvarium and extracranial soft tissues are normal. Sinuses/Orbits: No fluid levels or advanced mucosal thickening of the visualized paranasal sinuses. No mastoid or middle ear effusion. Normal orbits. Other: None. CT CERVICAL SPINE FINDINGS Alignment: No static subluxation. Facets are aligned. Occipital condyles are normally positioned. Skull base and vertebrae: No acute fracture. Soft tissues  and spinal canal: There is marked thickening of the prevertebral soft tissues, measuring 15 mm at the C3 level, previously 5 mm. Disc levels: No advanced spinal canal or neural foraminal stenosis. Upper chest: No pneumothorax, pulmonary nodule or pleural effusion. Other: Normal visualized paraspinal cervical soft tissues. IMPRESSION: 1. No acute intracranial abnormality. 2. No acute fracture or static subluxation of the cervical spine. 3. Marked thickening of the prevertebral soft tissues, measuring 15 mm at the C3 level, previously 5 mm. The source or the swelling is unclear. MRI may be helpful to assess for a cervical ligamentous injury. Electronically Signed   By: Franky Stanford M.D.   On: 03/28/2023 21:34   CT Cervical Spine Wo Contrast Result Date: 03/28/2023 CLINICAL DATA:  Head trauma EXAM: CT HEAD WITHOUT CONTRAST CT CERVICAL SPINE WITHOUT CONTRAST TECHNIQUE: Multidetector CT imaging of the head and cervical spine was performed following the standard protocol without intravenous contrast. Multiplanar CT image reconstructions of the cervical spine were also generated. RADIATION DOSE REDUCTION: This exam was performed according to the departmental dose-optimization program which  includes automated exposure control, adjustment of the mA and/or kV according to patient size and/or use of iterative reconstruction technique. COMPARISON:  01/06/2022 FINDINGS: CT HEAD FINDINGS Brain: No mass,hemorrhage or extra-axial collection. Normal appearance of the parenchyma and CSF spaces. Vascular: No hyperdense vessel or unexpected vascular calcification. Skull: The visualized skull base, calvarium and extracranial soft tissues are normal. Sinuses/Orbits: No fluid levels or advanced mucosal thickening of the visualized paranasal sinuses. No mastoid or middle ear effusion. Normal orbits. Other: None. CT CERVICAL SPINE FINDINGS Alignment: No static subluxation. Facets are aligned. Occipital condyles are normally positioned. Skull base and vertebrae: No acute fracture. Soft tissues and spinal canal: There is marked thickening of the prevertebral soft tissues, measuring 15 mm at the C3 level, previously 5 mm. Disc levels: No advanced spinal canal or neural foraminal stenosis. Upper chest: No pneumothorax, pulmonary nodule or pleural effusion. Other: Normal visualized paraspinal cervical soft tissues. IMPRESSION: 1. No acute intracranial abnormality. 2. No acute fracture or static subluxation of the cervical spine. 3. Marked thickening of the prevertebral soft tissues, measuring 15 mm at the C3 level, previously 5 mm. The source or the swelling is unclear. MRI may be helpful to assess for a cervical ligamentous injury. Electronically Signed   By: Franky Stanford M.D.   On: 03/28/2023 21:34   DG Chest 2 View Result Date: 03/28/2023 CLINICAL DATA:  Chest pain and shortness of breath, initial encounter EXAM: CHEST - 2 VIEW COMPARISON:  06/24/2013 FINDINGS: Cardiac shadow is within normal limits. Mild central vascular congestion is noted without edema. No focal infiltrate or effusion is seen. Postsurgical changes in the cervical spine are noted. IMPRESSION: Mild vascular congestion. Electronically Signed   By:  Oneil Devonshire M.D.   On: 03/28/2023 21:03    EKG:  Vent. rate 78 BPM PR interval 142 ms QRS duration 103 ms QT/QTcB 390/445 ms P-R-T axes -8 100 24 Sinus rhythm Right axis deviation Low voltage, precordial leads Borderline T abnormalities, anterior leads  Assessment & Plan:    Principal Problem:   Bacteremia Active Problems:   HTN (hypertension)   HLD (hyperlipidemia)   OSA (obstructive sleep apnea)   Hypokalemia   Bacteremia -Presents with fever, 3 out of 4 bottles gram-positive cocci -No clear etiology, extensive workup and imaging while in ED, there is some concern for mild pneumonia on imaging, there has been some perivertebral cervical spine fluid, ED discussed with neurosurgery  on-call who indicated that fluid is noninfectious and does not appear to be contributing to her bacteremia  -Blood cultures were repeated, and she was started on broad-spectrum antibiotic vancomycin  and cefepime  while in ED, will continue with vancomycin . -She does report some right lower back pain, she has tenderness to palpation in that area, she has been receiving epidural lumbar steroid injection every 35-month, most recent was in September, will obtain MRI to rule out infection. -Will admit to [facility], as likely she will be needing ID consult, and possible further procedures.  Pending  further workup.  Eluding MRI of lumbar spine. -Check CRP and procalcitonin   Diabetes mellitus -Will check A1c, and keep an insulin  sliding scale during hospital stay -Hold metformin   Questionable pneumonia on imaging -patient denies cough or any respiratory distress, there is a questionable opacity in the lingula area, will obtain Legionella, strep pneumonia antigen and urine cultures, will keep on Rocephin   Hypothyroidism -Continue with Synthroid   GERD -Continue with PPI  Hyperlipidemia -Continue with statin  OSA -continue with CPAP  Chronic pain syndrome-neuropathic  pain-fibromyalgia -Continue with home medications  Morbid obesity - Body mass index is 48.89 kg/m.  Hypertension -Continue with home medications, will add as needed hydralazine   Hypokalemia -Replaced, recheck in a.m.  Hyponatremia -Will give IV fluid, recheck in a.m.   DVT Prophylaxis SCDs, hold pharmacologic DVT prophylaxis pending lumbar spine results in case she will need procedure   AM Labs Ordered, also please review Full Orders  Family Communication: Admission, patients condition and plan of care including tests being ordered have been discussed with the patient and husband at bedside* who indicate understanding and agree with the plan and Code Status.  Code Status full code  Likely DC to home  Consults called: ED discussed with neurosurgery  Admission status: Inpatient  Time spent in minutes : 70 minutes   Brayton Lye M.D on 03/29/2023 at 7:41 PM   [facility] - Office  [phone]

## 2023-03-29 NOTE — Discharge Instructions (Addendum)
 You were evaluated in the Emergency Department and after careful evaluation, we did not find any emergent condition requiring admission or further testing in the hospital.  Your exam/testing today was overall reassuring.  Symptoms may be due to a pneumonia.  Take the amoxicillin  and azithromycin  antibiotics as directed.  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  Recommend follow-up with your neurosurgeon.  Please return to the Emergency Department if you experience any worsening of your condition.  Thank you for allowing us  to be a part of your care.

## 2023-03-29 NOTE — Progress Notes (Signed)
 Pharmacy Antibiotic Note  Debbie Goodwin is a 53 y.o. female admitted on 03/29/2023 with bacteremia.  Pharmacy has been consulted for Vancomycin  dosing.  Plan: Pt given Vancomycin  3000 mg total on 1/1 25. Vancomycin  1000 mg IV Q 12 hrs. Goal AUC 400-550. Expected AUC: 530.4 SCr used: 0.8 (03/29/23 Scr = 0.57), Vd used: 0.5, BMI: 48.8  Pharmacy will continue to follow and will adjust abx dosing whenever warranted.  Temp (24hrs), Avg:99.6 F (37.6 C), Min:98 F (36.7 C), Max:102.6 F (39.2 C)   Recent Labs  Lab 03/28/23 2053 03/29/23 0011 03/29/23 1729  WBC 9.9  --  8.9  CREATININE 0.75  --  0.57  LATICACIDVEN 1.5 1.5 1.4    Estimated Creatinine Clearance: 104.6 mL/min (by C-G formula based on SCr of 0.57 mg/dL).    Allergies  Allergen Reactions   Sulfa Antibiotics Hives    Antimicrobials this admission: 1/01 Ceftriaxone  >>  1/01 Vancomycin  >>   Microbiology results: 1/01 BCx: Pending 1/01 RespCx: Sent   Thank you for allowing pharmacy to be a part of this patient's care.  Rankin CANDIE Dills, PharmD, MBA 03/29/2023 8:21 PM

## 2023-03-29 NOTE — ED Notes (Signed)
See triage notes

## 2023-03-29 NOTE — ED Triage Notes (Signed)
 Pt was told to come back for positive blood cultures.  Pt reports she did pick up her antibiotics and does not feel any worse but she is still having back pain.

## 2023-03-29 NOTE — ED Notes (Signed)
 Note sent to pharmacist requesting meds be verified.

## 2023-03-29 NOTE — ED Notes (Signed)
 Date and time results received: 03/29/23 1916 (use smartphrase .now to insert current time)  Test: blood cx Critical Value: 3 of 8 bottles positive for staph aureus  Name of Provider Notified: dr pamella    Orders Received? Or Actions Taken?: Orders Received - See Orders for details

## 2023-03-29 NOTE — ED Notes (Signed)
 Bedside toilet brought to pt bedside.

## 2023-03-29 NOTE — Progress Notes (Signed)
 ED Pharmacy Antibiotic Sign Off An antibiotic consult was received from an ED provider for Vancomycin   per pharmacy dosing for sepsis. A chart review was completed to assess appropriateness.   The following one time order(s) were placed:  Vancomycin  2000 mg IV  Further antibiotic and/or antibiotic pharmacy consults should be ordered by the admitting provider if indicated.   Thank you for allowing pharmacy to be a part of this patient's care.   Dail Cordella Misty, [facility]  Clinical Pharmacist 03/29/23 2:58 AM

## 2023-03-29 NOTE — ED Notes (Signed)
 Date and time results received: 03/29/23 621pm (use smartphrase ".now" to insert current time)  Test: potassium Critical Value: 2.7  Name of Provider Notified: Dr Elayne Snare  Orders Received? Or Actions Taken?: Orders Received - See Orders for details

## 2023-03-29 NOTE — ED Notes (Signed)
 Neurosurgery paged at this.

## 2023-03-29 NOTE — ED Notes (Signed)
 Patient transported to CT

## 2023-03-29 NOTE — ED Provider Notes (Signed)
 [place] EMERGENCY DEPARTMENT AT [facility] Provider Note   CSN: [id number] Arrival date & time: 03/29/23  1522     History  Chief Complaint  Patient presents with   Abnormal Lab    Debbie Goodwin is a 53 y.o. female.  With a history of DJD, hypertension and hyperlipidemia presents to the ED for positive blood cultures.  Patient was evaluated yesterday for fever.  At that time she reported pain in both hips and neck pain as well.  CT head and UA were unremarkable yesterday.  CT chest abdomen pelvis showed concern for pneumonia.  An MRI of the spine showed prevertebral effusion extending from skull base to T4.  This was discussed with neurosurgery and was not felt to be the etiology of the patient's fever as pneumonia was favored.  She was instructed to come back to the ED after blood cultures drawn from yesterday grew gram-positive cocci.  She continues to report some hip pain and a fever.  Some dry coughing as well.  Does not feel short of breath.  No GI or urinary symptoms today.   Abnormal Lab      Home Medications Prior to Admission medications   Medication Sig Start Date End Date Taking? Authorizing Provider  ALPRAZolam  (XANAX ) 0.25 MG tablet Take 0.25 mg by mouth 3 (three) times daily as needed for anxiety. 09/28/22  Yes [provider]  amitriptyline  (ELAVIL ) 25 MG tablet Take 25-50 mg by mouth at bedtime. 05/09/22  Yes [provider]  azithromycin  (ZITHROMAX  Z-PAK) 250 MG tablet Take 1 tablet (250 mg total) by mouth daily. 500mg  PO day 1, then 250mg  PO days 205 03/29/23  Yes Cleotilde Rogue, MD  betamethasone  dipropionate 0.05 % cream Apply 1 Application topically 2 (two) times daily as needed (rash). 05/09/22  Yes [provider]  buPROPion  (WELLBUTRIN  XL) 300 MG 24 hr tablet Take 300 mg by mouth daily.   Yes [provider]  Cholecalciferol (VITAMIN D-3 PO) Take 1 capsule by mouth daily.   Yes [provider]  cyclobenzaprine   (FLEXERIL ) 10 MG tablet Take 10 mg by mouth 3 (three) times daily as needed for muscle spasms. 06/08/21  Yes [provider]  DULoxetine  (CYMBALTA ) 60 MG capsule Take 60 mg by mouth daily.   Yes [provider]  famotidine  (PEPCID ) 20 MG tablet Take 1 tablet (20 mg total) by mouth at bedtime. 06/25/20  Yes Kerman Vina HERO, NP  Flaxseed, Linseed, (FLAXSEED OIL PO) Take 1 capsule by mouth daily.   Yes [provider]  gabapentin  (NEURONTIN ) 300 MG capsule Take 900 mg by mouth daily.   Yes [provider]  ibuprofen (ADVIL,MOTRIN) 200 MG tablet Take 800 mg by mouth every 6 (six) hours as needed for moderate pain.   Yes [provider]  ketoconazole  (NIZORAL ) 2 % cream Apply topically 2 (two) times daily. 08/10/22  Yes [provider]  levothyroxine  (SYNTHROID ) 100 MCG tablet Take 100 mcg by mouth daily. 04/06/20  Yes [provider]  metFORMIN  (GLUCOPHAGE ) 1000 MG tablet Take 1,000 mg by mouth 2 (two) times daily. 01/31/23  Yes [provider]  mometasone (ELOCON) 0.1 % ointment Apply 1 Application topically daily.   Yes [provider]  olmesartan  (BENICAR ) 40 MG tablet Take 40 mg by mouth daily. 06/03/20  Yes [provider]  Omega-3 Fatty Acids (FISH OIL) 300 MG CAPS Take 1 capsule by mouth daily.   Yes [provider]  omeprazole (PRILOSEC) 40 MG  capsule Take 40 mg by mouth daily.   Yes [provider]  Oxycodone  HCl 10 MG TABS Take 1 tablet by mouth every 6 (six) hours as needed (pain).   Yes [provider]  pantoprazole  (PROTONIX ) 40 MG tablet Take 40 mg by mouth daily. 06/28/22  Yes [provider]  polyvinyl alcohol  (LIQUIFILM TEARS) 1.4 % ophthalmic solution Place 1 drop into both eyes as needed for dry eyes.   Yes [provider]  rOPINIRole  (REQUIP ) 1 MG tablet Take 1 mg by mouth as needed (restless legs). 06/28/22  Yes [provider]  rOPINIRole  (REQUIP ) 3  MG tablet Take 3 mg by mouth daily. 12/15/22  Yes [provider]  simvastatin  (ZOCOR ) 40 MG tablet Take 40 mg by mouth daily.   Yes [provider]  Zinc Acetate, Oral, (ZINC ACETATE PO) Take 1 tablet by mouth daily.   Yes [provider]  amoxicillin  (AMOXIL ) 500 MG capsule Take 1 capsule (500 mg total) by mouth 3 (three) times daily. Patient not taking: Reported on 03/29/2023 03/29/23   Cleotilde Rogue, MD  Blood Glucose Monitoring Suppl DEVI 1 each by Does not apply route in the morning, at noon, and at bedtime. May substitute to any manufacturer covered by patient's insurance. 07/05/22   Bernis Ernst, PA-C  fluconazole  (DIFLUCAN ) 150 MG tablet Take 1 tablet (150 mg total) by mouth every three (3) days as needed (yeast infection treatment/prevention). Patient not taking: Reported on 03/29/2023 03/29/23   Bero, Will Heinkel M, MD  methylPREDNISolone (MEDROL DOSEPAK) 4 MG TBPK tablet Take 4 mg by mouth as directed. 03/24/23   [provider]      Allergies    Sulfa antibiotics    Review of Systems   Review of Systems  Physical Exam Updated Vital Signs BP (!) 140/75   Pulse 78   Temp 99 F (37.2 C)   Resp 16   Ht 5' 3 (1.6 m)   Wt 125.2 kg   LMP 07/26/2020 Comment: denies possibility of pregnancy  SpO2 97%   BMI 48.89 kg/m  Physical Exam Vitals and nursing note reviewed.  HENT:     Head: Normocephalic and atraumatic.  Eyes:     Pupils: Pupils are equal, round, and reactive to light.  Cardiovascular:     Rate and Rhythm: Normal rate and regular rhythm.  Pulmonary:     Effort: Pulmonary effort is normal.     Breath sounds: Normal breath sounds.  Abdominal:     Palpations: Abdomen is soft.     Tenderness: There is no abdominal tenderness.  Musculoskeletal:     Cervical back: Normal range of motion and neck supple. No rigidity or tenderness.     Comments: No midline tenderness step-off deformities of back  Skin:    General: Skin is warm and dry.   Neurological:     General: No focal deficit present.     Mental Status: She is alert.     Sensory: No sensory deficit.     Motor: No weakness.  Psychiatric:        Mood and Affect: Mood normal.     ED Results / Procedures / Treatments   Labs (all labs ordered are listed, but only abnormal results are displayed) Labs Reviewed  COMPREHENSIVE METABOLIC PANEL - Abnormal; Notable for the following components:      Result Value   Sodium 131 (*)    Potassium 2.7 (*)    Chloride 97 (*)    Glucose,  Bld 193 (*)    Calcium 8.0 (*)    Albumin 3.3 (*)    AST 42 (*)    ALT 50 (*)    Total Bilirubin 1.3 (*)    All other components within normal limits  CBC WITH DIFFERENTIAL/PLATELET - Abnormal; Notable for the following components:   RBC 3.58 (*)    Hemoglobin 10.5 (*)    HCT 31.6 (*)    Platelets 131 (*)    Monocytes Absolute 1.1 (*)    All other components within normal limits  SEDIMENTATION RATE - Abnormal; Notable for the following components:   Sed Rate 55 (*)    All other components within normal limits  MAGNESIUM  - Abnormal; Notable for the following components:   Magnesium  1.6 (*)    All other components within normal limits  CULTURE, BLOOD (ROUTINE X 2)  CULTURE, BLOOD (ROUTINE X 2)  EXPECTORATED SPUTUM ASSESSMENT W GRAM STAIN, RFLX TO RESP C  LACTIC ACID, PLASMA  PROCALCITONIN  C-REACTIVE PROTEIN  HEMOGLOBIN A1C  LEGIONELLA PNEUMOPHILA SEROGP 1 UR AG  STREP PNEUMONIAE URINARY ANTIGEN  HIV ANTIBODY (ROUTINE TESTING W REFLEX)  BASIC METABOLIC PANEL  CBC    EKG None  Radiology DG Chest Portable 1 View Result Date: 03/29/2023 CLINICAL DATA:  Known positive blood cultures EXAM: PORTABLE CHEST 1 VIEW COMPARISON:  CT from earlier in the same day. FINDINGS: Cardiac shadow is within normal limits. Mild vascular congestion is again seen. No focal confluent infiltrate is noted. No bony abnormality is seen. Surgical changes are noted in the cervical spine. IMPRESSION: Stable  vascular congestion.  No focal infiltrate is seen. Electronically Signed   By: Oneil Devonshire M.D.   On: 03/29/2023 18:40   CT CHEST ABDOMEN PELVIS W CONTRAST Result Date: 03/29/2023 CLINICAL DATA:  Sepsis. Shortness of breath. Patient reports recent fall. EXAM: CT CHEST, ABDOMEN, AND PELVIS WITH CONTRAST TECHNIQUE: Multidetector CT imaging of the chest, abdomen and pelvis was performed following the standard protocol during bolus administration of intravenous contrast. RADIATION DOSE REDUCTION: This exam was performed according to the departmental dose-optimization program which includes automated exposure control, adjustment of the mA and/or kV according to patient size and/or use of iterative reconstruction technique. CONTRAST:  OMNIPAQUE  IOHEXOL  300 MG/ML  SOLN COMPARISON:  Chest radiograph yesterday FINDINGS: CT CHEST FINDINGS Cardiovascular: Normal caliber thoracic aorta heart is normal in size. Trace pericardial effusion. No central pulmonary embolus on this exam not tailored to pulmonary arteries ses mint. Mediastinum/Nodes: Cervical prevertebral soft tissue fluid and edema extends into the upper thorax posterior to the esophagus. There is no esophageal wall thickening or inflammation. No mediastinal or hilar adenopathy. No thyroid  nodule. Lungs/Pleura: Trace pleural effusions. Dependent opacities in the right greater than left lower lobe. Minimal ill-defined patchy opacity in the lingula. Benign calcified granuloma in the right upper lobe. Musculoskeletal: There are no acute or suspicious osseous abnormalities. CT ABDOMEN PELVIS FINDINGS Hepatobiliary: The liver is enlarged spanning 21.7 cm cranial caudal. Diffuse hepatic steatosis. Marked nodular contours suspicious for cirrhosis. No focal liver abnormality. Gallbladder physiologically distended, no calcified stone. No biliary dilatation. Pancreas: No ductal dilatation or inflammation. Spleen: Enlarged, 15.1 cm AP.  No focal abnormality.  Adrenals/Urinary Tract: Normal adrenal glands. No hydronephrosis. No evidence of focal renal abnormality or stone. Excreted contrast in the urinary bladder, equivocal bladder wall thickening. Stomach/Bowel: Lack of enteric contrast and motion limits bowel assessment patulous distal esophagus. Scattered fluid within nondilated small bowel. There may be mild small bowel wall thickening  and edema in the central small bowel, for example coronal series 4, image 71. Mixed liquid and solid stool in the colon. No pericolonic edema. Vascular/Lymphatic: Aortic atherosclerosis without aneurysm. The portal vein is patent. Periportal adenopathy including a 12 mm porta hepatis node series 4, image 59. Reproductive: Retroverted uterus.  No adnexal mass. Other: No ascites or free air. Small fat containing umbilical hernia. Musculoskeletal: Lower lumbar facet hypertrophy. There are no acute or suspicious osseous abnormalities. IMPRESSION: 1. Trace pleural effusions with dependent opacities in the right greater than left lower lobes, likely atelectasis. Minimal ill-defined patchy opacity in the lingula, may be atelectasis or pneumonia. 2. Hepatomegaly with hepatic steatosis. Nodular contours suspicious for cirrhosis. Splenomegaly. 3. Periportal adenopathy, likely reactive. 4. Possible mild small bowel wall thickening and edema in the central small bowel, can be seen with enteritis. Mixed liquid and solid stool in the. 5. Cervical prevertebral soft tissue fluid and edema extends into the upper thorax posterior to the esophagus at the thoracic inlet. Etiology is indeterminate. Aortic Atherosclerosis (ICD10-I70.0). Electronically Signed   By: Andrea Gasman M.D.   On: 03/29/2023 03:21   MR Cervical Spine W and Wo Contrast Result Date: 03/29/2023 CLINICAL DATA:  Prevertebral edema EXAM: MRI CERVICAL SPINE WITHOUT AND WITH CONTRAST TECHNIQUE: Multiplanar and multiecho pulse sequences of the cervical spine, to include the  craniocervical junction and cervicothoracic junction, were obtained without and with intravenous contrast. CONTRAST:  10mL GADAVIST  GADOBUTROL  1 MMOL/ML IV SOLN COMPARISON:  11/24/2020 FINDINGS: Alignment: Physiologic. Vertebrae: No fracture, evidence of discitis, or bone lesion. C4-7 ACDF. No evidence of ligamentous injury. Cord: Normal signal and morphology. Posterior Fossa, vertebral arteries, paraspinal tissues: Prevertebral effusion extending from the skull base inferiorly to at least T4. Maximum thickness is approximately 13 mm at C3. Disc levels: C1-2: Unremarkable. C2-3: Normal disc space and facet joints. There is no spinal canal stenosis. No neural foraminal stenosis. C3-4: Left-greater-than-right facet hypertrophy and mild uncovertebral spurring. There is no spinal canal stenosis. Mild right moderate left neural foraminal stenosis. C4-5: ACDF. There is no spinal canal stenosis. No neural foraminal stenosis. C5-6: ACDF. There is no spinal canal stenosis. No neural foraminal stenosis. C6-7: ACDF. There is no spinal canal stenosis. No neural foraminal stenosis. C7-T1: Normal disc space and facet joints. There is no spinal canal stenosis. No neural foraminal stenosis. IMPRESSION: 1. Prevertebral effusion extending from the skull base inferiorly to at least T4. Maximum thickness is approximately 13 mm at C3. The etiology remains unclear. 2. C4-7 ACDF without residual spinal canal or neural foraminal stenosis. 3. Mild right and moderate left C3-4 neural foraminal stenosis. Electronically Signed   By: Franky Stanford M.D.   On: 03/29/2023 00:40   CT Head Wo Contrast Result Date: 03/28/2023 CLINICAL DATA:  Head trauma EXAM: CT HEAD WITHOUT CONTRAST CT CERVICAL SPINE WITHOUT CONTRAST TECHNIQUE: Multidetector CT imaging of the head and cervical spine was performed following the standard protocol without intravenous contrast. Multiplanar CT image reconstructions of the cervical spine were also generated. RADIATION  DOSE REDUCTION: This exam was performed according to the departmental dose-optimization program which includes automated exposure control, adjustment of the mA and/or kV according to patient size and/or use of iterative reconstruction technique. COMPARISON:  01/06/2022 FINDINGS: CT HEAD FINDINGS Brain: No mass,hemorrhage or extra-axial collection. Normal appearance of the parenchyma and CSF spaces. Vascular: No hyperdense vessel or unexpected vascular calcification. Skull: The visualized skull base, calvarium and extracranial soft tissues are normal. Sinuses/Orbits: No fluid levels or advanced mucosal thickening of  the visualized paranasal sinuses. No mastoid or middle ear effusion. Normal orbits. Other: None. CT CERVICAL SPINE FINDINGS Alignment: No static subluxation. Facets are aligned. Occipital condyles are normally positioned. Skull base and vertebrae: No acute fracture. Soft tissues and spinal canal: There is marked thickening of the prevertebral soft tissues, measuring 15 mm at the C3 level, previously 5 mm. Disc levels: No advanced spinal canal or neural foraminal stenosis. Upper chest: No pneumothorax, pulmonary nodule or pleural effusion. Other: Normal visualized paraspinal cervical soft tissues. IMPRESSION: 1. No acute intracranial abnormality. 2. No acute fracture or static subluxation of the cervical spine. 3. Marked thickening of the prevertebral soft tissues, measuring 15 mm at the C3 level, previously 5 mm. The source or the swelling is unclear. MRI may be helpful to assess for a cervical ligamentous injury. Electronically Signed   By: Franky Stanford M.D.   On: 03/28/2023 21:34   CT Cervical Spine Wo Contrast Result Date: 03/28/2023 CLINICAL DATA:  Head trauma EXAM: CT HEAD WITHOUT CONTRAST CT CERVICAL SPINE WITHOUT CONTRAST TECHNIQUE: Multidetector CT imaging of the head and cervical spine was performed following the standard protocol without intravenous contrast. Multiplanar CT image  reconstructions of the cervical spine were also generated. RADIATION DOSE REDUCTION: This exam was performed according to the departmental dose-optimization program which includes automated exposure control, adjustment of the mA and/or kV according to patient size and/or use of iterative reconstruction technique. COMPARISON:  01/06/2022 FINDINGS: CT HEAD FINDINGS Brain: No mass,hemorrhage or extra-axial collection. Normal appearance of the parenchyma and CSF spaces. Vascular: No hyperdense vessel or unexpected vascular calcification. Skull: The visualized skull base, calvarium and extracranial soft tissues are normal. Sinuses/Orbits: No fluid levels or advanced mucosal thickening of the visualized paranasal sinuses. No mastoid or middle ear effusion. Normal orbits. Other: None. CT CERVICAL SPINE FINDINGS Alignment: No static subluxation. Facets are aligned. Occipital condyles are normally positioned. Skull base and vertebrae: No acute fracture. Soft tissues and spinal canal: There is marked thickening of the prevertebral soft tissues, measuring 15 mm at the C3 level, previously 5 mm. Disc levels: No advanced spinal canal or neural foraminal stenosis. Upper chest: No pneumothorax, pulmonary nodule or pleural effusion. Other: Normal visualized paraspinal cervical soft tissues. IMPRESSION: 1. No acute intracranial abnormality. 2. No acute fracture or static subluxation of the cervical spine. 3. Marked thickening of the prevertebral soft tissues, measuring 15 mm at the C3 level, previously 5 mm. The source or the swelling is unclear. MRI may be helpful to assess for a cervical ligamentous injury. Electronically Signed   By: Franky Stanford M.D.   On: 03/28/2023 21:34   DG Chest 2 View Result Date: 03/28/2023 CLINICAL DATA:  Chest pain and shortness of breath, initial encounter EXAM: CHEST - 2 VIEW COMPARISON:  06/24/2013 FINDINGS: Cardiac shadow is within normal limits. Mild central vascular congestion is noted  without edema. No focal infiltrate or effusion is seen. Postsurgical changes in the cervical spine are noted. IMPRESSION: Mild vascular congestion. Electronically Signed   By: Oneil Devonshire M.D.   On: 03/28/2023 21:03    Procedures Procedures    Medications Ordered in ED Medications  acetaminophen  (TYLENOL ) tablet 650 mg (has no administration in time range)    Or  acetaminophen  (TYLENOL ) suppository 650 mg (has no administration in time range)  oxyCODONE  (Oxy IR/ROXICODONE ) immediate release tablet 5 mg (has no administration in time range)  morphine  (PF) 2 MG/ML injection 2 mg (has no administration in time range)  polyethylene glycol (MIRALAX  /  GLYCOLAX ) packet 17 g (has no administration in time range)  bisacodyl  (DULCOLAX) suppository 10 mg (has no administration in time range)  albuterol  (PROVENTIL ) (2.5 MG/3ML) 0.083% nebulizer solution 2.5 mg (has no administration in time range)  hydrALAZINE  (APRESOLINE ) injection 5 mg (has no administration in time range)  insulin  aspart (novoLOG ) injection 0-15 Units (has no administration in time range)  insulin  aspart (novoLOG ) injection 0-5 Units (has no administration in time range)  cefTRIAXone  (ROCEPHIN ) 2 g in sodium chloride  0.9 % 100 mL IVPB (has no administration in time range)  potassium chloride  SA (KLOR-CON  M) CR tablet 40 mEq (has no administration in time range)  ALPRAZolam  (XANAX ) tablet 0.25 mg (has no administration in time range)  amitriptyline  (ELAVIL ) tablet 25-50 mg (has no administration in time range)  buPROPion  (WELLBUTRIN  XL) 24 hr tablet 300 mg (has no administration in time range)  DULoxetine  (CYMBALTA ) DR capsule 60 mg (has no administration in time range)  gabapentin  (NEURONTIN ) capsule 900 mg (has no administration in time range)  levothyroxine  (SYNTHROID ) tablet 100 mcg (has no administration in time range)  irbesartan  (AVAPRO ) tablet 37.5 mg (has no administration in time range)  pantoprazole  (PROTONIX ) EC  tablet 40 mg (has no administration in time range)  rOPINIRole  (REQUIP ) tablet 3 mg (has no administration in time range)  simvastatin  (ZOCOR ) tablet 40 mg (has no administration in time range)  vancomycin  (VANCOCIN ) IVPB 1000 mg/200 mL premix (has no administration in time range)  magnesium  sulfate IVPB 2 g 50 mL (has no administration in time range)  sodium chloride  0.9 % bolus 1,000 mL (0 mLs Intravenous Stopped 03/29/23 1854)  vancomycin  (VANCOCIN ) IVPB 1000 mg/200 mL premix (0 mg Intravenous Stopped 03/29/23 1854)  ceFEPIme  (MAXIPIME ) 2 g in sodium chloride  0.9 % 100 mL IVPB (0 g Intravenous Stopped 03/29/23 1829)  acetaminophen  (TYLENOL ) tablet 1,000 mg (1,000 mg Oral Given 03/29/23 1826)  potassium chloride  (KLOR-CON ) packet 40 mEq (40 mEq Oral Given 03/29/23 1854)  0.9 % NaCl with KCl 20 mEq/ L  infusion ( Intravenous Rate/Dose Verify 03/29/23 2020)    ED Course/ Medical Decision Making/ A&P Clinical Course as of 03/29/23 2045  Wed Mar 29, 2023  1830 I discussed with neurosurgery again today.  They do not feel like primary source of infection could be the prevertebral fluid collection seen on MRI yesterday.  Most likely etiology remains pneumonia.  She will be admitted for inpatient treatment of pneumonia with repeat cultures pending [MP]    Clinical Course User Index [MP] Pamella Ozell LABOR, DO                                 Medical Decision Making 53 year old female with history as above initially presented yesterday for fever and hip pain.  Workup revealed concern for pneumonia.  MRI taken showed fluid collection within the prevertebral space and this was discussed with neurosurgery but not suspected to be the underlying etiology of her fever as pneumonia was favored.  She was discharged home on antibiotics.  Called back today given positive blood cultures (gram-positive cocci).  Upon initial assessment she is hemodynamically stable and febrile.  No adventitious lung sounds, meningismus, focal  neurologic deficits or back tenderness on my exam.  Will repeat laboratory workup including blood cultures venous lactate CBC CMP and inflammatory markers.  Will cover with broad-spectrum antibiotics (vancomycin  and cefepime ) and reach back out to neurosurgery to discuss positive blood cultures today.  Planning for admission which patient is amenable to.  Will provide IV fluids for rehydration but will not give her 30 cc/kg upfront as blood pressure is currently stable.  Will continue to monitor hemodynamic status  Amount and/or Complexity of Data Reviewed Labs: ordered. Radiology: ordered.  Risk OTC drugs. Prescription drug management. Decision regarding hospitalization.           Final Clinical Impression(s) / ED Diagnoses Final diagnoses:  Pneumonia due to infectious organism, unspecified laterality, unspecified part of lung  Bacteremia  Hypokalemia    Rx / DC Orders ED Discharge Orders     None         Pamella Ozell LABOR, DO 03/29/23 2046

## 2023-03-29 NOTE — Telephone Encounter (Signed)
 Changed pt pharmacy at their request

## 2023-03-30 ENCOUNTER — Inpatient Hospital Stay (HOSPITAL_COMMUNITY): Payer: Commercial Managed Care - PPO

## 2023-03-30 ENCOUNTER — Other Ambulatory Visit (HOSPITAL_COMMUNITY): Payer: Self-pay | Admitting: *Deleted

## 2023-03-30 DIAGNOSIS — R7881 Bacteremia: Secondary | ICD-10-CM

## 2023-03-30 DIAGNOSIS — I10 Essential (primary) hypertension: Secondary | ICD-10-CM

## 2023-03-30 DIAGNOSIS — E7849 Other hyperlipidemia: Secondary | ICD-10-CM | POA: Diagnosis not present

## 2023-03-30 DIAGNOSIS — E876 Hypokalemia: Secondary | ICD-10-CM | POA: Diagnosis not present

## 2023-03-30 LAB — CBC
HCT: 32.3 % — ABNORMAL LOW (ref 36.0–46.0)
Hemoglobin: 10.3 g/dL — ABNORMAL LOW (ref 12.0–15.0)
MCH: 29.2 pg (ref 26.0–34.0)
MCHC: 31.9 g/dL (ref 30.0–36.0)
MCV: 91.5 fL (ref 80.0–100.0)
Platelets: 118 10*3/uL — ABNORMAL LOW (ref 150–400)
RBC: 3.53 MIL/uL — ABNORMAL LOW (ref 3.87–5.11)
RDW: 13.2 % (ref 11.5–15.5)
WBC: 8.3 10*3/uL (ref 4.0–10.5)
nRBC: 0 % (ref 0.0–0.2)

## 2023-03-30 LAB — BASIC METABOLIC PANEL
Anion gap: 9 (ref 5–15)
BUN: 11 mg/dL (ref 6–20)
CO2: 23 mmol/L (ref 22–32)
Calcium: 8.1 mg/dL — ABNORMAL LOW (ref 8.9–10.3)
Chloride: 104 mmol/L (ref 98–111)
Creatinine, Ser: 0.54 mg/dL (ref 0.44–1.00)
GFR, Estimated: >60 mL/min (ref >60)
Glucose, Bld: 156 mg/dL — ABNORMAL HIGH (ref 70–99)
Potassium: 3.3 mmol/L — ABNORMAL LOW (ref 3.5–5.1)
Sodium: 136 mmol/L (ref 135–145)

## 2023-03-30 LAB — ECHOCARDIOGRAM COMPLETE
AR max vel: 2.48 cm2
AV Area VTI: 2.35 cm2
AV Area mean vel: 2.39 cm2
AV Mean grad: 5.1 mm[Hg]
AV Peak grad: 9.4 mm[Hg]
Ao pk vel: 1.53 m/s
Area-P 1/2: 5.13 cm2
Height: 63 in
S' Lateral: 3.2 cm
Weight: 4416 [oz_av]

## 2023-03-30 LAB — CBG MONITORING, ED
Glucose-Capillary: 155 mg/dL — ABNORMAL HIGH (ref 70–99)
Glucose-Capillary: 128 mg/dL — ABNORMAL HIGH (ref 70–99)

## 2023-03-30 LAB — STREP PNEUMONIAE URINARY ANTIGEN: Strep Pneumo Urinary Antigen: NEGATIVE

## 2023-03-30 LAB — GLUCOSE, CAPILLARY
Glucose-Capillary: 133 mg/dL — ABNORMAL HIGH (ref 70–99)
Glucose-Capillary: 133 mg/dL — ABNORMAL HIGH (ref 70–99)

## 2023-03-30 LAB — HIV ANTIBODY (ROUTINE TESTING W REFLEX): HIV Screen 4th Generation wRfx: NONREACTIVE

## 2023-03-30 MED ORDER — GADOBUTROL 1 MMOL/ML IV SOLN
10.0000 mL | Freq: Once | INTRAVENOUS | Status: AC | PRN
  Administered 2023-03-30: 10 mL via INTRAVENOUS

## 2023-03-30 MED ORDER — POTASSIUM CHLORIDE CRYS ER 20 MEQ PO TBCR
40.0000 meq | EXTENDED_RELEASE_TABLET | Freq: Once | ORAL | Status: AC
  Administered 2023-03-30: 40 meq via ORAL
  Filled 2023-03-30: qty 2

## 2023-03-30 MED ORDER — CEFAZOLIN SODIUM-DEXTROSE 2-4 GM/100ML-% IV SOLN
2.0000 g | Freq: Three times a day (TID) | INTRAVENOUS | Status: DC
  Administered 2023-03-30 – 2023-04-06 (×21): 2 g via INTRAVENOUS
  Filled 2023-03-30 (×21): qty 100

## 2023-03-30 NOTE — TOC CM/SW Note (Signed)
 Transition of Care [facility]) - Inpatient Brief Assessment   Patient Details  Name: Debbie Goodwin MRN: [id number] Date of Birth: [birth year]  [facility] Care [facility] CM/SW Contact:    Lucie Lunger, LCSWA Phone Number: 03/30/2023, 11:38 AM   Clinical Narrative: Transition of Care Department [facility]) has reviewed patient and no TOC needs have been identified at this time. We will continue to monitor patient advancement through interdisciplinary progression rounds. If new patient transition needs arise, please place a TOC consult.    Transition of Care Asessment: Insurance and Status: Insurance coverage has been reviewed Patient has primary care physician: Yes Home environment has been reviewed: From home Prior level of function:: Independent Prior/Current Home Services: No current home services Social Drivers of Health Review: SDOH reviewed no interventions necessary Readmission risk has been reviewed: Yes Transition of care needs: no transition of care needs at this time

## 2023-03-30 NOTE — ED Notes (Signed)
Patient going to mri

## 2023-03-30 NOTE — Plan of Care (Signed)

## 2023-03-30 NOTE — ED Notes (Signed)
ECHO completed at bedside

## 2023-03-30 NOTE — Progress Notes (Signed)
 Pharmacy Antibiotic Note  Debbie Goodwin is a 53 y.o. female admitted on 03/29/2023 with bacteremia.  Pharmacy has been consulted to change Vancomycin  to ancef .  Plan: Ancef  2g q8 hours  Pharmacy will continue to follow and will adjust abx dosing whenever warranted.  Temp (24hrs), Avg:99.4 F (37.4 C), Min:98.6 F (37 C), Max:101.8 F (38.8 C)   Recent Labs  Lab 03/28/23 2053 03/29/23 0011 03/29/23 1729 03/30/23 0508  WBC 9.9  --  8.9 8.3  CREATININE 0.75  --  0.57 0.54  LATICACIDVEN 1.5 1.5 1.4  --     Estimated Creatinine Clearance: 104.6 mL/min (by C-G formula based on SCr of 0.54 mg/dL).    Allergies  Allergen Reactions   Sulfa Antibiotics Hives    Antimicrobials this admission: 1/01 Vancomycin  >> 1/2 Ancef  1/2>  Microbiology results: 1/01 BCx: NGTD 1/01 RespCx: Sent   Thank you for allowing pharmacy to be a part of this patient's care.  Dempsey Blush PharmD., BCPS Clinical Pharmacist 03/30/2023 8:36 AM

## 2023-03-30 NOTE — Progress Notes (Signed)
*  PRELIMINARY RESULTS* Echocardiogram 2D Echocardiogram has been performed.  Debbie Goodwin 03/30/2023, 9:45 AM

## 2023-03-30 NOTE — ED Notes (Signed)
 Pt transported to MRI

## 2023-03-30 NOTE — Consult Note (Signed)
 [facility]                                                                                        Patient Identification: Patient Name: Debbie Goodwin MRN: [id number] Admit Date: 03/29/2023  4:06 PM Today's Date: 03/30/2023 Reason for consult: staph bacteremia Requesting provider: CHAMP autoconsult   Principal Problem:   Bacteremia Active Problems:   HTN (hypertension)   HLD (hyperlipidemia)   OSA (obstructive sleep apnea)   Hypokalemia   Antibiotics:  Vancomycin  12/31- Ceftriaxone  12/31 Cefepime  1/1-  Lines/Hardware: PIV  Assessment # MSSA bacteremia complicated with  # 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 Underlying right facet septic arthritis at T11-12, presumably septic, with myositis    # Prevertebral effusion extending from the skull base inferiorly to at least T4, approximately 13 mm at C3 - no intervention from neurosurgery during admission  # CT with trace pleural effusions with dependent opacities in the right greater than left lower lobes, likely atelectasis. Minimal ill-defined patchy opacity in the lingula, may be atelectasis or pneumonia.- No respiratory symptoms and unlikely to be pna, septic pulmonary emboli in the DD  # Possible liver cirrhosis/Hepatomegaly and splenomegaly  Recommendations  - DC Vancomycin  and ceftriaxone , start cefazolin   - Fu repeat blood cultures  - TTE ordered  - Monitor for other metastatic sites of infection including other peripheral joints for need to image - Monitor CBC and CMP - Check Hepatitis B and C serology  - Per Primary, Neurosurgery has been engaged with new findings of MRI T L spine and plan for transfer to [facility] waiting for bed availability.  - Following remotely  Rest of the management as per the primary team. Please call with questions or concerns.  Thank you for the  consult  __________________________________________________________________________________________________________ HPI and Hospital Course: 53 year old female with PMH of HLD, HTN, GERD, fibromyalgia, obesity, DM, chronic cervical radiculopathy s/p decompression 2022 and currently on lumbar steroid injection every 3 months by Dr. Camellia Sous with most recent in September 2024 for chronic back pain who presented to the ED on 9/1 after blood cultures were positive for Staph aureus.  She was seen in the ED day before for fever reported to have pain in both hips, neck pain.  CT CAP with concerns for pneumonia, MRI was concerning for prevertebral effusion extending from skull base to T4 which was discussed with neurosurgery and infection was thought to be less likely.  No reported shortness of breath but had dry cough, no reported GI or urinary symptoms.  However she had a fall when she tripped on her dog's leash with right lower back pain but no other symptoms like new focal weakness, numbness or tingling or urinary or stool incontinence.  At ED febrile Labs remarkable for lactic acid 1.5, wbc 8.9, hb 10.5, plts 131 . Na 131, k 2.7, ast 42, alt 50, tb 1.3, esr 55, crp 21.8, ua unremarkable for UTI  Imagings as below   12/31 CT head Marked thickening of the prevertebral soft tissues, measuring 15 mm at the C3 level, previously 5  mm. The source or the swelling is unclear. MRI may be helpful to assess for a cervical ligamentous injury.  12.31 MRI C cspine  IMPRESSION: 1. Prevertebral effusion extending from the skull base inferiorly to at least T4. Maximum thickness is approximately 13 mm at C3. The etiology remains unclear. 2. C4-7 ACDF without residual spinal canal or neural foraminal stenosis. 3. Mild right and moderate left C3-4 neural foraminal stenosis.  ROS: General- Denies fever, chills, loss of appetite and loss of weight HEENT - Denies headache, blurry vision, neck pain, sinus  pain Chest - Denies any chest pain, SOB or cough CVS- Denies any dizziness/lightheadedness, syncopal attacks, palpitations Abdomen- Denies any nausea, vomiting, abdominal pain, hematochezia and diarrhea Neuro - Denies any weakness, numbness, tingling sensation Psych - Denies any changes in mood irritability or depressive symptoms GU- Denies any burning, dysuria, hematuria or increased frequency of urination Skin - denies any rashes/lesions MSK - denies any joint pain/swelling or restricted ROM   Past Medical History:  Diagnosis Date   Anxiety    Fibromyalgia    GERD (gastroesophageal reflux disease)    HLD (hyperlipidemia)    Hypertension    Status post dilation of esophageal narrowing    Thyroid  disease    Past Surgical History:  Procedure Laterality Date   CARPAL TUNNEL RELEASE Bilateral 2016   CHOLECYSTECTOMY     LASIK Bilateral    PLANTAR FASCIA SURGERY Right 1998   Scheduled Meds:  amitriptyline   25-50 mg Oral QHS   buPROPion   300 mg Oral Daily   DULoxetine   60 mg Oral Daily   gabapentin   900 mg Oral Daily   insulin  aspart  0-15 Units Subcutaneous TID WC   insulin  aspart  0-5 Units Subcutaneous QHS   irbesartan   37.5 mg Oral Daily   levothyroxine   100 mcg Oral Q0600   pantoprazole   40 mg Oral Daily   rOPINIRole   3 mg Oral Daily   simvastatin   40 mg Oral q1800   Continuous Infusions:  cefTRIAXone  (ROCEPHIN )  IV     vancomycin      PRN Meds:.acetaminophen  **OR** acetaminophen , albuterol , ALPRAZolam , bisacodyl , hydrALAZINE , morphine  injection, oxyCODONE , polyethylene glycol  Allergies  Allergen Reactions   Sulfa Antibiotics Hives   Social History   Socioeconomic History   Marital status: Married    Spouse name: Not on file   Number of children: 0   Years of education: Not on file   Highest education level: Not on file  Occupational History   Occupation: repiratory therapist  Tobacco Use   Smoking status: Never   Smokeless tobacco: Never  Vaping Use    Vaping status: Never Used  Substance and Sexual Activity   Alcohol  use: No   Drug use: No   Sexual activity: Not on file  Other Topics Concern   Not on file  Social History Narrative   Not on file   Social Drivers of Health   Financial Resource Strain: Not on file  Food Insecurity: Not on file  Transportation Needs: Not on file  Physical Activity: Not on file  Stress: Not on file  Social Connections: Not on file  Intimate Partner Violence: Not on file   Family History  Problem Relation Age of Onset   Hypertension Mother    Hyperlipidemia Mother    Colon polyps Father    Heart attack Father    Hypertension Father    Hyperlipidemia Father    Heart attack Maternal Grandmother    Stroke Paternal Grandfather  Colon cancer Neg Hx    Esophageal cancer Neg Hx     Vitals BP (!) 121/49   Pulse 86   Temp 98.8 F (37.1 C) (Oral)   Resp 20   Ht 5' 3 (1.6 m)   Wt 125.2 kg   LMP 07/26/2020 Comment: denies possibility of pregnancy  SpO2 95%   BMI 48.89 kg/m   Physical Exam Remote consult unable to examine  Pertinent Microbiology Results for orders placed or performed during the hospital encounter of 03/29/23  Culture, blood (routine x 2)     Status: None (Preliminary result)   Collection Time: 03/29/23  5:29 PM   Specimen: Blood  Result Value Ref Range Status   Specimen Description BLOOD RFOA  Final   Special Requests   Final    BOTTLES DRAWN AEROBIC AND ANAEROBIC Blood Culture results may not be optimal due to an inadequate volume of blood received in culture bottles Performed at [facility], [address], [place], [place] [address]    Culture PENDING  Incomplete   Report Status PENDING  Incomplete  Culture, blood (routine x 2)     Status: None (Preliminary result)   Collection Time: 03/29/23  5:35 PM   Specimen: Blood  Result Value Ref Range Status   Specimen Description BLOOD LEFT FOREARM  Final   Special Requests   Final    BOTTLES DRAWN AEROBIC ONLY  Blood Culture results may not be optimal due to an inadequate volume of blood received in culture bottles Performed at [facility], [address] [place], [place] [address]    Culture PENDING  Incomplete   Report Status PENDING  Incomplete   Pertinent Lab seen by me:    Latest Ref Rng & Units 03/30/2023    5:08 AM 03/29/2023    5:29 PM 03/28/2023    8:53 PM  CBC  WBC 4.0 - 10.5 K/uL 8.3  8.9  9.9   Hemoglobin 12.0 - 15.0 g/dL 89.6  89.4  88.4   Hematocrit 36.0 - 46.0 % 32.3  31.6  34.3   Platelets 150 - 400 K/uL 118  131  152       Latest Ref Rng & Units 03/30/2023    5:08 AM 03/29/2023    5:29 PM 03/28/2023    8:53 PM  CMP  Glucose 70 - 99 mg/dL 843  806  821   BUN 6 - 20 mg/dL 11  12  15    Creatinine 0.44 - 1.00 mg/dL 9.45  9.42  9.24   Sodium 135 - 145 mmol/L 136  131  129   Potassium 3.5 - 5.1 mmol/L 3.3  2.7  3.0   Chloride 98 - 111 mmol/L 104  97  94   CO2 22 - 32 mmol/L 23  22  22    Calcium 8.9 - 10.3 mg/dL 8.1  8.0  8.2   Total Protein 6.5 - 8.1 g/dL  6.9  7.3   Total Bilirubin 0.0 - 1.2 mg/dL  1.3  1.5   Alkaline Phos 38 - 126 U/L  60  63   AST 15 - 41 U/L  42  43   ALT 0 - 44 U/L  50  51     Pertinent Imagings/Other Imagings Plain films and CT images have been personally visualized and interpreted; radiology reports have been reviewed. Decision making incorporated into the Impression / Recommendations.  DG Chest Portable 1 View Result Date: 03/29/2023 CLINICAL DATA:  Known positive blood cultures EXAM: PORTABLE  CHEST 1 VIEW COMPARISON:  CT from earlier in the same day. FINDINGS: Cardiac shadow is within normal limits. Mild vascular congestion is again seen. No focal confluent infiltrate is noted. No bony abnormality is seen. Surgical changes are noted in the cervical spine. IMPRESSION: Stable vascular congestion.  No focal infiltrate is seen. Electronically Signed   By: Oneil Devonshire M.D.   On: 03/29/2023 18:40   CT CHEST ABDOMEN PELVIS W CONTRAST Result Date:  03/29/2023 CLINICAL DATA:  Sepsis. Shortness of breath. Patient reports recent fall. EXAM: CT CHEST, ABDOMEN, AND PELVIS WITH CONTRAST TECHNIQUE: Multidetector CT imaging of the chest, abdomen and pelvis was performed following the standard protocol during bolus administration of intravenous contrast. RADIATION DOSE REDUCTION: This exam was performed according to the departmental dose-optimization program which includes automated exposure control, adjustment of the mA and/or kV according to patient size and/or use of iterative reconstruction technique. CONTRAST:  OMNIPAQUE  IOHEXOL  300 MG/ML  SOLN COMPARISON:  Chest radiograph yesterday FINDINGS: CT CHEST FINDINGS Cardiovascular: Normal caliber thoracic aorta heart is normal in size. Trace pericardial effusion. No central pulmonary embolus on this exam not tailored to pulmonary arteries ses mint. Mediastinum/Nodes: Cervical prevertebral soft tissue fluid and edema extends into the upper thorax posterior to the esophagus. There is no esophageal wall thickening or inflammation. No mediastinal or hilar adenopathy. No thyroid  nodule. Lungs/Pleura: Trace pleural effusions. Dependent opacities in the right greater than left lower lobe. Minimal ill-defined patchy opacity in the lingula. Benign calcified granuloma in the right upper lobe. Musculoskeletal: There are no acute or suspicious osseous abnormalities. CT ABDOMEN PELVIS FINDINGS Hepatobiliary: The liver is enlarged spanning 21.7 cm cranial caudal. Diffuse hepatic steatosis. Marked nodular contours suspicious for cirrhosis. No focal liver abnormality. Gallbladder physiologically distended, no calcified stone. No biliary dilatation. Pancreas: No ductal dilatation or inflammation. Spleen: Enlarged, 15.1 cm AP.  No focal abnormality. Adrenals/Urinary Tract: Normal adrenal glands. No hydronephrosis. No evidence of focal renal abnormality or stone. Excreted contrast in the urinary bladder, equivocal bladder wall  thickening. Stomach/Bowel: Lack of enteric contrast and motion limits bowel assessment patulous distal esophagus. Scattered fluid within nondilated small bowel. There may be mild small bowel wall thickening and edema in the central small bowel, for example coronal series 4, image 71. Mixed liquid and solid stool in the colon. No pericolonic edema. Vascular/Lymphatic: Aortic atherosclerosis without aneurysm. The portal vein is patent. Periportal adenopathy including a 12 mm porta hepatis node series 4, image 59. Reproductive: Retroverted uterus.  No adnexal mass. Other: No ascites or free air. Small fat containing umbilical hernia. Musculoskeletal: Lower lumbar facet hypertrophy. There are no acute or suspicious osseous abnormalities. IMPRESSION: 1. Trace pleural effusions with dependent opacities in the right greater than left lower lobes, likely atelectasis. Minimal ill-defined patchy opacity in the lingula, may be atelectasis or pneumonia. 2. Hepatomegaly with hepatic steatosis. Nodular contours suspicious for cirrhosis. Splenomegaly. 3. Periportal adenopathy, likely reactive. 4. Possible mild small bowel wall thickening and edema in the central small bowel, can be seen with enteritis. Mixed liquid and solid stool in the. 5. Cervical prevertebral soft tissue fluid and edema extends into the upper thorax posterior to the esophagus at the thoracic inlet. Etiology is indeterminate. Aortic Atherosclerosis (ICD10-I70.0). Electronically Signed   By: Andrea Gasman M.D.   On: 03/29/2023 03:21   MR Cervical Spine W and Wo Contrast Result Date: 03/29/2023 CLINICAL DATA:  Prevertebral edema EXAM: MRI CERVICAL SPINE WITHOUT AND WITH CONTRAST TECHNIQUE: Multiplanar and multiecho pulse sequences of the  cervical spine, to include the craniocervical junction and cervicothoracic junction, were obtained without and with intravenous contrast. CONTRAST:  10mL GADAVIST  GADOBUTROL  1 MMOL/ML IV SOLN COMPARISON:  11/24/2020  FINDINGS: Alignment: Physiologic. Vertebrae: No fracture, evidence of discitis, or bone lesion. C4-7 ACDF. No evidence of ligamentous injury. Cord: Normal signal and morphology. Posterior Fossa, vertebral arteries, paraspinal tissues: Prevertebral effusion extending from the skull base inferiorly to at least T4. Maximum thickness is approximately 13 mm at C3. Disc levels: C1-2: Unremarkable. C2-3: Normal disc space and facet joints. There is no spinal canal stenosis. No neural foraminal stenosis. C3-4: Left-greater-than-right facet hypertrophy and mild uncovertebral spurring. There is no spinal canal stenosis. Mild right moderate left neural foraminal stenosis. C4-5: ACDF. There is no spinal canal stenosis. No neural foraminal stenosis. C5-6: ACDF. There is no spinal canal stenosis. No neural foraminal stenosis. C6-7: ACDF. There is no spinal canal stenosis. No neural foraminal stenosis. C7-T1: Normal disc space and facet joints. There is no spinal canal stenosis. No neural foraminal stenosis. IMPRESSION: 1. Prevertebral effusion extending from the skull base inferiorly to at least T4. Maximum thickness is approximately 13 mm at C3. The etiology remains unclear. 2. C4-7 ACDF without residual spinal canal or neural foraminal stenosis. 3. Mild right and moderate left C3-4 neural foraminal stenosis. Electronically Signed   By: Franky Stanford M.D.   On: 03/29/2023 00:40   CT Head Wo Contrast Result Date: 03/28/2023 CLINICAL DATA:  Head trauma EXAM: CT HEAD WITHOUT CONTRAST CT CERVICAL SPINE WITHOUT CONTRAST TECHNIQUE: Multidetector CT imaging of the head and cervical spine was performed following the standard protocol without intravenous contrast. Multiplanar CT image reconstructions of the cervical spine were also generated. RADIATION DOSE REDUCTION: This exam was performed according to the departmental dose-optimization program which includes automated exposure control, adjustment of the mA and/or kV according to  patient size and/or use of iterative reconstruction technique. COMPARISON:  01/06/2022 FINDINGS: CT HEAD FINDINGS Brain: No mass,hemorrhage or extra-axial collection. Normal appearance of the parenchyma and CSF spaces. Vascular: No hyperdense vessel or unexpected vascular calcification. Skull: The visualized skull base, calvarium and extracranial soft tissues are normal. Sinuses/Orbits: No fluid levels or advanced mucosal thickening of the visualized paranasal sinuses. No mastoid or middle ear effusion. Normal orbits. Other: None. CT CERVICAL SPINE FINDINGS Alignment: No static subluxation. Facets are aligned. Occipital condyles are normally positioned. Skull base and vertebrae: No acute fracture. Soft tissues and spinal canal: There is marked thickening of the prevertebral soft tissues, measuring 15 mm at the C3 level, previously 5 mm. Disc levels: No advanced spinal canal or neural foraminal stenosis. Upper chest: No pneumothorax, pulmonary nodule or pleural effusion. Other: Normal visualized paraspinal cervical soft tissues. IMPRESSION: 1. No acute intracranial abnormality. 2. No acute fracture or static subluxation of the cervical spine. 3. Marked thickening of the prevertebral soft tissues, measuring 15 mm at the C3 level, previously 5 mm. The source or the swelling is unclear. MRI may be helpful to assess for a cervical ligamentous injury. Electronically Signed   By: Franky Stanford M.D.   On: 03/28/2023 21:34   CT Cervical Spine Wo Contrast Result Date: 03/28/2023 CLINICAL DATA:  Head trauma EXAM: CT HEAD WITHOUT CONTRAST CT CERVICAL SPINE WITHOUT CONTRAST TECHNIQUE: Multidetector CT imaging of the head and cervical spine was performed following the standard protocol without intravenous contrast. Multiplanar CT image reconstructions of the cervical spine were also generated. RADIATION DOSE REDUCTION: This exam was performed according to the departmental dose-optimization program which includes automated  exposure control, adjustment of the mA and/or kV according to patient size and/or use of iterative reconstruction technique. COMPARISON:  01/06/2022 FINDINGS: CT HEAD FINDINGS Brain: No mass,hemorrhage or extra-axial collection. Normal appearance of the parenchyma and CSF spaces. Vascular: No hyperdense vessel or unexpected vascular calcification. Skull: The visualized skull base, calvarium and extracranial soft tissues are normal. Sinuses/Orbits: No fluid levels or advanced mucosal thickening of the visualized paranasal sinuses. No mastoid or middle ear effusion. Normal orbits. Other: None. CT CERVICAL SPINE FINDINGS Alignment: No static subluxation. Facets are aligned. Occipital condyles are normally positioned. Skull base and vertebrae: No acute fracture. Soft tissues and spinal canal: There is marked thickening of the prevertebral soft tissues, measuring 15 mm at the C3 level, previously 5 mm. Disc levels: No advanced spinal canal or neural foraminal stenosis. Upper chest: No pneumothorax, pulmonary nodule or pleural effusion. Other: Normal visualized paraspinal cervical soft tissues. IMPRESSION: 1. No acute intracranial abnormality. 2. No acute fracture or static subluxation of the cervical spine. 3. Marked thickening of the prevertebral soft tissues, measuring 15 mm at the C3 level, previously 5 mm. The source or the swelling is unclear. MRI may be helpful to assess for a cervical ligamentous injury. Electronically Signed   By: Franky Stanford M.D.   On: 03/28/2023 21:34   DG Chest 2 View Result Date: 03/28/2023 CLINICAL DATA:  Chest pain and shortness of breath, initial encounter EXAM: CHEST - 2 VIEW COMPARISON:  06/24/2013 FINDINGS: Cardiac shadow is within normal limits. Mild central vascular congestion is noted without edema. No focal infiltrate or effusion is seen. Postsurgical changes in the cervical spine are noted. IMPRESSION: Mild vascular congestion. Electronically Signed   By: Oneil Devonshire M.D.    On: 03/28/2023 21:03   This is a remote tele-consult and recommendations are based on chart review only as patient not seen in person.  I have personally spent 60  minutes involved in face-to-face and non-face-to-face activities for this patient on the day of the visit. Professional time spent includes the following activities: Preparing to see the patient (review of tests), Obtaining and/or reviewing separately obtained history (admission/discharge record), Performing a medically appropriate examination and/or evaluation , Ordering medications/tests/procedures, referring and communicating with other health care professionals, Documenting clinical information in the EMR, Independently interpreting results (not separately reported), Communicating results to the patient/family/caregiver, Counseling and educating the patient/family/caregiver and Care coordination (not separately reported).  Electronically signed by:   Plan d/w requesting provider as well as ID pharm D  Of note, portions of this note may have been created with voice recognition software. While this note has been edited for accuracy, occasional wrong-word or 'sound-a-like' substitutions may have occurred due to the inherent limitations of voice recognition software.   Annalee Orem, MD Infectious Disease Physician [facility] Pager: [phone]

## 2023-03-30 NOTE — Progress Notes (Signed)
 Progress Note   Patient: Debbie Goodwin FMW:[id number] DOB: [birth year] DOA: 03/29/2023     1 DOS: the patient was seen and examined on 03/30/2023   Brief hospital admission course: As per H&P written by Dr. Sherlon on 03/29/2023  Debbie Goodwin  is a 53 y.o. female, with past medical history of obesity, hypertension, fibromyalgia, hyperlipidemia, diabetes mellitus, chronic cervical radiculopathy, status post decompression surgery by Dr. Mavis in 2022, patient currently on due to lumbar steroid injection every 2-month by Dr. Darlis, most recent in September 2024. -Patient presents to ED today as she was called back for positive blood cultures, patient had an ED visit yesterday, secondary to complaints of a fever, patient has chronic neck pain, lower back pain, reports her chronic back pain at baseline, workup yesterday was significant for fluid collection around cervical spine area, for which she had MRI cervical spine obtained which did show fluid collection at the base of the skull extending into the cervical spine surrounding area, ED discussed with neurosurgery yesterday, who did not feel this fluid has to do with her fever, CT chest-abdomen-pelvis has been with evidence of small opacity in the lingula area with possible pneumonia versus atelectasis, patient was discharged on p.o. antibiotics, but her blood culture 3 out of 4 bottles came back significant for gram-positive cocci, she was called back to ED, ED physician discussed with neurosurgery again who still felt her cervical fluid collection is not related to infection, hospitalist consulted to admit for her bacteremia, upon further questioning, patient reports she had a fall at week as she tripped on her dog's leash, she does report right lower back pain over last few days as well, but other that she denies any new complaints, reports her cervical spine pain is chronic without significant worsening, she denies any other new focal deficits, tingling,  numbness no dysuria, no urine or stool incontinence-. -In ED her workup significant for glucose at 193, sodium 131, potassium of 2.7, lactic acid within normal limit, and [facility] hospitalist consulted to admit  Assessment and Plan: 1-MSSA bacteremia -Case has been discussed with ID; TTE will be ordered -Antibiotics adjusted and transition to IV Ancef  -Given abnormal findings in patient's lumbar MRI with concern for epidural abscess thoracic MRI has been ordered after discussed with radiology service. -Will follow results and involve neurosurgery if needed. -Continue as needed analgesics and follow clinical response. -Follow culture results.  2-chest x-ray suggesting atelectasis -Patient reports no shortness of breath or coughing spells -Flutter valve and incentive spirometer will be provided -Currently receiving Ancef  as mentioned above for bacteremia. -Afebrile and normal WBCs present.  3-hypokalemia/hyponatremia -Electrolytes has been repleted and fluid resuscitation provided -Patient advised to maintain adequate hydration -Follow electrolytes trend/stability.  4-hypothyroidism -Continue Synthroid   5-GERD -Continue PPI.  6-hyperlipidemia -Continue statin.  7-hypertension -Continue home antihypertensive agents -Follow vital signs. -Heart healthy/low-sodium diet discussed with patient.  8-obesity/OSA -Body mass index is 48.89 kg/m. -Low-calorie diet, portion control and increase physical activity discussed with patient -Continue CPAP nightly.   Subjective:  Afebrile, good saturation on room air; no chest pain, no nausea, no vomiting.  Expressing pain in her back.  Physical Exam: Vitals:   03/30/23 0400 03/30/23 0500 03/30/23 0515 03/30/23 0600  BP: (!) 121/49 139/75  135/85  Pulse: 86  82 77  Resp:  18  18  Temp:      TempSrc:      SpO2: 95% 99% 99% 100%  Weight:      Height:  General exam: Alert, awake, oriented x 3; no fever, no focal  deficit. Respiratory system: Clear to auscultation. Respiratory effort normal.  Good saturation on room air. Cardiovascular system:RRR. No rubs or gallops. Gastrointestinal system: Abdomen is obese, nondistended, soft and nontender. No organomegaly or masses felt. Normal bowel sounds heard. Central nervous system: Moving 4 limbs spontaneously.  No focal neurological deficits. Extremities: No cyanosis or clubbing. Skin: No petechiae. Psychiatry: Judgement and insight appear normal. Mood & affect appropriate.   Data Reviewed: CBC: WBCs 8.3, hemoglobin 10.3 and platelet count 1 18K Basic metabolic panel: Sodium 136, potassium 3.3, chloride 104, bicarb 23, BUN 11, creatinine 0.5 and GFR >60  Family Communication: No family at bedside.  Disposition: Status is: Inpatient Remains inpatient appropriate because: Continue IV antibiotics.   Planned Discharge Destination: Home   Time spent: 50 minutes  Author: Eric Nunnery, MD 03/30/2023 8:20 AM  For on call review [email or web address].

## 2023-03-31 DIAGNOSIS — G061 Intraspinal abscess and granuloma: Secondary | ICD-10-CM | POA: Diagnosis not present

## 2023-03-31 DIAGNOSIS — B9561 Methicillin susceptible Staphylococcus aureus infection as the cause of diseases classified elsewhere: Secondary | ICD-10-CM

## 2023-03-31 DIAGNOSIS — G039 Meningitis, unspecified: Secondary | ICD-10-CM

## 2023-03-31 DIAGNOSIS — M4644 Discitis, unspecified, thoracic region: Secondary | ICD-10-CM | POA: Diagnosis not present

## 2023-03-31 DIAGNOSIS — M4643 Discitis, unspecified, cervicothoracic region: Secondary | ICD-10-CM

## 2023-03-31 DIAGNOSIS — T847XXA Infection and inflammatory reaction due to other internal orthopedic prosthetic devices, implants and grafts, initial encounter: Secondary | ICD-10-CM

## 2023-03-31 DIAGNOSIS — I340 Nonrheumatic mitral (valve) insufficiency: Secondary | ICD-10-CM

## 2023-03-31 DIAGNOSIS — E119 Type 2 diabetes mellitus without complications: Secondary | ICD-10-CM

## 2023-03-31 DIAGNOSIS — R7881 Bacteremia: Secondary | ICD-10-CM | POA: Diagnosis not present

## 2023-03-31 LAB — HEPATITIS C ANTIBODY: HCV Ab: NONREACTIVE

## 2023-03-31 LAB — CULTURE, BLOOD (ROUTINE X 2): Special Requests: ADEQUATE

## 2023-03-31 LAB — HEPATITIS B CORE ANTIBODY, TOTAL: Hep B Core Total Ab: NONREACTIVE

## 2023-03-31 LAB — BASIC METABOLIC PANEL
Anion gap: 14 (ref 5–15)
BUN: 9 mg/dL (ref 6–20)
CO2: 22 mmol/L (ref 22–32)
Calcium: 8 mg/dL — ABNORMAL LOW (ref 8.9–10.3)
Chloride: 95 mmol/L — ABNORMAL LOW (ref 98–111)
Creatinine, Ser: 0.84 mg/dL (ref 0.44–1.00)
GFR, Estimated: >60 mL/min (ref >60)
Glucose, Bld: 283 mg/dL — ABNORMAL HIGH (ref 70–99)
Potassium: 3.5 mmol/L (ref 3.5–5.1)
Sodium: 131 mmol/L — ABNORMAL LOW (ref 135–145)

## 2023-03-31 LAB — GLUCOSE, CAPILLARY
Glucose-Capillary: 140 mg/dL — ABNORMAL HIGH (ref 70–99)
Glucose-Capillary: 105 mg/dL — ABNORMAL HIGH (ref 70–99)
Glucose-Capillary: 144 mg/dL — ABNORMAL HIGH (ref 70–99)
Glucose-Capillary: 152 mg/dL — ABNORMAL HIGH (ref 70–99)

## 2023-03-31 LAB — MAGNESIUM: Magnesium: 1.6 mg/dL — ABNORMAL LOW (ref 1.7–2.4)

## 2023-03-31 LAB — HEMOGLOBIN A1C
Hgb A1c MFr Bld: 7.7 % — ABNORMAL HIGH (ref 4.8–5.6)
Mean Plasma Glucose: 174 mg/dL

## 2023-03-31 LAB — HEPATITIS B SURFACE ANTIGEN: Hepatitis B Surface Ag: NONREACTIVE

## 2023-03-31 MED ORDER — OXYCODONE HCL 5 MG PO TABS
10.0000 mg | ORAL_TABLET | Freq: Four times a day (QID) | ORAL | Status: DC | PRN
  Administered 2023-03-31 – 2023-04-02 (×7): 10 mg via ORAL
  Filled 2023-03-31 (×7): qty 2

## 2023-03-31 MED ORDER — CYCLOBENZAPRINE HCL 10 MG PO TABS
10.0000 mg | ORAL_TABLET | Freq: Three times a day (TID) | ORAL | Status: DC | PRN
  Administered 2023-03-31 – 2023-04-05 (×5): 10 mg via ORAL
  Filled 2023-03-31 (×6): qty 1

## 2023-03-31 MED ORDER — MORPHINE SULFATE (PF) 2 MG/ML IV SOLN
2.0000 mg | INTRAVENOUS | Status: DC | PRN
  Administered 2023-03-31 – 2023-04-05 (×9): 2 mg via INTRAVENOUS
  Filled 2023-03-31 (×9): qty 1

## 2023-03-31 MED ORDER — POLYVINYL ALCOHOL 1.4 % OP SOLN: 1.0000 [drp] | OPHTHALMIC | Status: DC | PRN

## 2023-03-31 MED ORDER — FAMOTIDINE 20 MG PO TABS
20.0000 mg | ORAL_TABLET | Freq: Every day | ORAL | Status: DC
  Administered 2023-03-31 – 2023-04-05 (×6): 20 mg via ORAL
  Filled 2023-03-31 (×6): qty 1

## 2023-03-31 MED ORDER — METFORMIN HCL 500 MG PO TABS
1000.0000 mg | ORAL_TABLET | Freq: Two times a day (BID) | ORAL | Status: DC
  Administered 2023-03-31 – 2023-04-06 (×12): 1000 mg via ORAL
  Filled 2023-03-31 (×12): qty 2

## 2023-03-31 NOTE — Progress Notes (Signed)
 [facility]    Date of Admission:  03/29/2023     Total days of antibiotics 4  Cefazolin  day 2   Ceftriaxone  x 1, Cefepime  x 1             Reason for Consult: MSSA Bacteremia, Vertebral infection     Referring Provider: Royal Begun  Primary Care Provider: Bertell Satterfield, MD    Assessment: Debbie Goodwin is a 53 y.o. 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 Mavis following - no indications for surgery. With her blood cultures being positive no need to put her through disc aspirate of abscess material.  She is still febrile today day 3 of appropriate antibiotics. Blood cultures from 1/1 redraw have GPCs on stain from anaerobic bottle - could be a contaminant from venipuncture, will follow.  -continue cefazolin   -Follow repeated blood cultures from redraw on 1/1 -Follow fever curve -Please hold on any invasive lines as hemodynamics allow until bacteremia is cleared  -Would check with Cardiology next week to see if TEE is doable to FU the abnormal appearing MV on TTE and inability to see the AV well in the setting of prevertebral effusion mentioned with C spine to T4. Non-urgent - will see if Carley can have cards eval next week.   Prevertebral Effusion from Skull Base to T4 -  S/P ACDF C4-7 -  Intact upper extremity strength and equal b/l. No CN abnormalities. Bones and disc space per nsgy read on C/L spine w/o signs of infection; ?if the prevertebral fluid may reflect hematoma from fall? She did hit the back of her head/neck from what she mentions.  Would follow up on this with MRI in 1-2 weeks or sooner if deficits develop to ensure no worsening features. -Repeat MRI in a week   Chronic LBP -  Last steroid injection in October she believes  No evidence of infection on MRI   Diabetes -  Not terribly uncontrolled with A1c 7.7%  -per primary       Plan: Continue cefazolin  Will see if Carley can have cards eval for candidacy for TEE next week  Hold on BCx as may be regrowing MSSA from 1/1 redraw.     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 Active Problems:   HTN (hypertension)   HLD (hyperlipidemia)   MSSA bacteremia   OSA (obstructive sleep apnea)   Hypokalemia    amitriptyline   25-50 mg Oral QHS   buPROPion   300 mg Oral Daily   DULoxetine   60 mg Oral Daily   famotidine   20 mg Oral QHS   gabapentin   900 mg Oral Daily   insulin  aspart  0-15 Units Subcutaneous TID WC   insulin  aspart  0-5 Units Subcutaneous QHS   irbesartan   37.5 mg Oral Daily   levothyroxine   100 mcg Oral Q0600   metFORMIN   1,000 mg Oral BID WC   pantoprazole   40 mg Oral Daily   rOPINIRole   3 mg Oral Daily   simvastatin   40 mg Oral q1800    HPI: Debbie Goodwin is a 53 y.o. female admitted with back pain.   She has a history of diabetes with A1C 7.7% recently. She does have a history of AEDF for cervicalgia and chronic LBP for which she takes chronic opiates. No street drugs.  Went to [facility] on 12/31 with neck pain - She had a bad fall Friday when she stepped down off a wet step and she fell backward on her front porch. She says she was sore as expected. She said she noticed worsening pain on the right flank area that eventually involved both sides. Her best friend has been having to help her to get up and around the house getting her to and from the bathroom. She has not had any neck pain during current admission - was a little sore at the time of injury but that's not where her pain has localized. She has a h/o  L4-5 bulging discs with intermittent symptoms of foot drop. She has used epidural steroid injections that help provide relief for CLBP. She does have some foot drop symptoms that were PTA going on for a while. This is not presently worse.   CRP 21.8 ESR 55  MRI with posterior fluid collection T11-12 suspicious for  epidural abscess with adjacent facet arthritis. L3-4 spine degeneration and moderate stenosis w/o infectious pathology.  Blood cultures have grown out MSSA  Review of Systems: Review of Systems  Constitutional:  Positive for chills and fever.  Musculoskeletal:  Positive for back pain.  Neurological:  Positive for focal weakness (intermitten foot drop PTA). Negative for tingling and headaches.    Past Medical History:  Diagnosis Date   Anxiety    Fibromyalgia    GERD (gastroesophageal reflux disease)    HLD (hyperlipidemia)    Hypertension    Status post dilation of esophageal narrowing    Thyroid  disease     Social History   Tobacco Use   Smoking status: Never   Smokeless tobacco: Never  Vaping Use   Vaping status: Never Used  Substance Use Topics   Alcohol  use: No   Drug use: No    Family History  Problem Relation Age of Onset   Hypertension Mother    Hyperlipidemia Mother    Colon polyps Father    Heart attack Father    Hypertension Father    Hyperlipidemia Father    Heart attack Maternal Grandmother    Stroke Paternal Grandfather    Colon cancer Neg Hx    Esophageal cancer Neg Hx    Allergies  Allergen Reactions   Sulfa Antibiotics Hives    OBJECTIVE: Blood pressure (!) 160/69, pulse 93, temperature (!) 100.5 F (38.1 C), temperature source Oral, resp. rate 15, height 5' 3 (1.6 m), weight 125.2 kg, last menstrual period 07/26/2020, SpO2 98%.  Physical Exam Constitutional:      Appearance: Normal appearance. She is not ill-appearing.  Cardiovascular:     Rate and Rhythm: Normal rate.  Pulmonary:     Effort: Pulmonary effort is normal.  Skin:    General: Skin is warm and dry.     Capillary Refill: Capillary refill takes less than 2 seconds.  Neurological:     Mental Status: She is alert and oriented to person, place, and time.     Lab Results Lab Results  Component Value Date   WBC 8.3 03/30/2023   HGB 10.3 (L) 03/30/2023   HCT 32.3 (L)  03/30/2023   MCV 91.5 03/30/2023   PLT 118 (L) 03/30/2023    Lab Results  Component Value Date   CREATININE 0.84 03/31/2023   BUN 9 03/31/2023   NA 131 (L) 03/31/2023   K 3.5 03/31/2023   CL 95 (L) 03/31/2023   CO2 22 03/31/2023    Lab Results  Component  Value Date   ALT 50 (H) 03/29/2023   AST 42 (H) 03/29/2023   ALKPHOS 60 03/29/2023   BILITOT 1.3 (H) 03/29/2023     Microbiology: Recent Results (from the past 240 hours)  Resp panel by RT-PCR (RSV, Flu A&B, Covid) Anterior Nasal Swab     Status: None   Collection Time: 03/28/23  6:31 PM   Specimen: Anterior Nasal Swab  Result Value Ref Range Status   SARS Coronavirus 2 by RT PCR NEGATIVE NEGATIVE Final    Comment: (NOTE) SARS-CoV-2 target nucleic acids are NOT DETECTED.  The SARS-CoV-2 RNA is generally detectable in upper respiratory specimens during the acute phase of infection. The lowest concentration of SARS-CoV-2 viral copies this assay can detect is 138 copies/mL. A negative result does not preclude SARS-Cov-2 infection and should not be used as the sole basis for treatment or other patient management decisions. A negative result may occur with  improper specimen collection/handling, submission of specimen other than nasopharyngeal swab, presence of viral mutation(s) within the areas targeted by this assay, and inadequate number of viral copies(<138 copies/mL). A negative result must be combined with clinical observations, patient history, and epidemiological information. The expected result is Negative.  Fact Sheet for Patients:  bloggercourse.com  Fact Sheet for Healthcare Providers:  seriousbroker.it  This test is no t yet approved or cleared by the [place]  FDA and  has been authorized for detection and/or diagnosis of SARS-CoV-2 by FDA under an Emergency Use Authorization (EUA). This EUA will remain  in effect (meaning this test can be used) for the  duration of the COVID-19 declaration under Section 564(b)(1) of the Act, 21 U.S.C.section 360bbb-3(b)(1), unless the authorization is terminated  or revoked sooner.       Influenza A by PCR NEGATIVE NEGATIVE Final   Influenza B by PCR NEGATIVE NEGATIVE Final    Comment: (NOTE) The Xpert Xpress SARS-CoV-2/FLU/RSV plus assay is intended as an aid in the diagnosis of influenza from Nasopharyngeal swab specimens and should not be used as a sole basis for treatment. Nasal washings and aspirates are unacceptable for Xpert Xpress SARS-CoV-2/FLU/RSV testing.  Fact Sheet for Patients: bloggercourse.com  Fact Sheet for Healthcare Providers: seriousbroker.it  This test is not yet approved or cleared by the [place]  FDA and has been authorized for detection and/or diagnosis of SARS-CoV-2 by FDA under an Emergency Use Authorization (EUA). This EUA will remain in effect (meaning this test can be used) for the duration of the COVID-19 declaration under Section 564(b)(1) of the Act, 21 U.S.C. section 360bbb-3(b)(1), unless the authorization is terminated or revoked.     Resp Syncytial Virus by PCR NEGATIVE NEGATIVE Final    Comment: (NOTE) Fact Sheet for Patients: bloggercourse.com  Fact Sheet for Healthcare Providers: seriousbroker.it  This test is not yet approved or cleared by the [place]  FDA and has been authorized for detection and/or diagnosis of SARS-CoV-2 by FDA under an Emergency Use Authorization (EUA). This EUA will remain in effect (meaning this test can be used) for the duration of the COVID-19 declaration under Section 564(b)(1) of the Act, 21 U.S.C. section 360bbb-3(b)(1), unless the authorization is terminated or revoked.  Performed at [facility], [address], [place], [place] [address]   Blood culture (routine x 2)     Status: Abnormal   Collection Time:  03/28/23  8:17 PM   Specimen: Right Antecubital; Blood  Result Value Ref Range Status   Specimen Description   Final    RIGHT ANTECUBITAL  Performed at [facility], [address], [place], [place] [address]    Special Requests   Final    BOTTLES DRAWN AEROBIC AND ANAEROBIC Blood Culture results may not be optimal due to an inadequate volume of blood received in culture bottles Performed at [facility] [address], [place], [place] [address]    Culture  Setup Time   Final    AEROBIC BOTTLE ONLY GRAM POSITIVE COCCI Gram Stain Report Called to,Read Back By and Verified With: EMILY GANT @ 1053 ON 03/29/23 C VARNER GRAM STAIN REVIEWED-AGREE WITH RESULT DRT    Culture (A)  Final    STAPHYLOCOCCUS AUREUS SUSCEPTIBILITIES PERFORMED ON PREVIOUS CULTURE WITHIN THE LAST 5 DAYS. Performed at [facility], [address], [place], [place] [address]    Report Status 03/31/2023 FINAL  Final  Blood culture (routine x 2)     Status: Abnormal   Collection Time: 03/28/23  8:53 PM   Specimen: BLOOD LEFT HAND  Result Value Ref Range Status   Specimen Description   Final    BLOOD LEFT HAND Performed at [facility], [address], [place], [place] [address]    Special Requests   Final    BOTTLES DRAWN AEROBIC AND ANAEROBIC Blood Culture adequate volume Performed at [facility], [address], [place], [place] [address]    Culture  Setup Time   Final    GRAM POSITIVE COCCI IN BOTH AEROBIC AND ANAEROBIC BOTTLES Gram Stain Report Called to,Read Back By and Verified With: DOSS M @ 1300 ON [id number] BY HENDERSON L GRAM STAIN REVIEWED-AGREE WITH RESULT DRT CRITICAL RESULT CALLED TO, READ BACK BY AND VERIFIED WITH: RN CHRISTY EDWARDS ON 03/29/23 @ 1916 BY DRT Performed at [facility], [address], [place], [place] [address]    Culture STAPHYLOCOCCUS AUREUS (A)  Final   Report Status 03/31/2023 FINAL  Final   Organism ID, Bacteria STAPHYLOCOCCUS AUREUS  Final      Susceptibility    Staphylococcus aureus - MIC*    CIPROFLOXACIN <=0.5 SENSITIVE Sensitive     ERYTHROMYCIN <=0.25 SENSITIVE Sensitive     GENTAMICIN <=0.5 SENSITIVE Sensitive     OXACILLIN 0.5 SENSITIVE Sensitive     TETRACYCLINE <=1 SENSITIVE Sensitive     VANCOMYCIN  1 SENSITIVE Sensitive     TRIMETH/SULFA <=10 SENSITIVE Sensitive     CLINDAMYCIN <=0.25 SENSITIVE Sensitive     RIFAMPIN <=0.5 SENSITIVE Sensitive     Inducible Clindamycin NEGATIVE Sensitive     LINEZOLID 2 SENSITIVE Sensitive     * STAPHYLOCOCCUS AUREUS  Blood Culture ID Panel (Reflexed)     Status: Abnormal   Collection Time: 03/28/23  8:53 PM  Result Value Ref Range Status   Enterococcus faecalis NOT DETECTED NOT DETECTED Final   Enterococcus Faecium NOT DETECTED NOT DETECTED Final   Listeria monocytogenes NOT DETECTED NOT DETECTED Final   Staphylococcus species DETECTED (A) NOT DETECTED Final    Comment: CRITICAL RESULT CALLED TO, READ BACK BY AND VERIFIED WITH: RN CHRISTY EDWARDS ON 03/29/23 @ 1916 BY DRT    Staphylococcus aureus (BCID) DETECTED (A) NOT DETECTED Final    Comment: CRITICAL RESULT CALLED TO, READ BACK BY AND VERIFIED WITH: RN CHRISTY EDWARDS ON 03/29/23 @ 1916 BY DRT    Staphylococcus epidermidis NOT DETECTED NOT DETECTED Final   Staphylococcus lugdunensis NOT DETECTED NOT DETECTED Final   Streptococcus species NOT DETECTED NOT DETECTED Final   Streptococcus agalactiae NOT DETECTED NOT DETECTED Final  Streptococcus pneumoniae NOT DETECTED NOT DETECTED Final   Streptococcus pyogenes NOT DETECTED NOT DETECTED Final   A.calcoaceticus-baumannii NOT DETECTED NOT DETECTED Final   Bacteroides fragilis NOT DETECTED NOT DETECTED Final   Enterobacterales NOT DETECTED NOT DETECTED Final   Enterobacter cloacae complex NOT DETECTED NOT DETECTED Final   Escherichia coli NOT DETECTED NOT DETECTED Final   Klebsiella aerogenes NOT DETECTED NOT DETECTED Final   Klebsiella oxytoca NOT DETECTED NOT DETECTED Final   Klebsiella  pneumoniae NOT DETECTED NOT DETECTED Final   Proteus species NOT DETECTED NOT DETECTED Final   Salmonella species NOT DETECTED NOT DETECTED Final   Serratia marcescens NOT DETECTED NOT DETECTED Final   Haemophilus influenzae NOT DETECTED NOT DETECTED Final   Neisseria meningitidis NOT DETECTED NOT DETECTED Final   Pseudomonas aeruginosa NOT DETECTED NOT DETECTED Final   Stenotrophomonas maltophilia NOT DETECTED NOT DETECTED Final   Candida albicans NOT DETECTED NOT DETECTED Final   Candida auris NOT DETECTED NOT DETECTED Final   Candida glabrata NOT DETECTED NOT DETECTED Final   Candida krusei NOT DETECTED NOT DETECTED Final   Candida parapsilosis NOT DETECTED NOT DETECTED Final   Candida tropicalis NOT DETECTED NOT DETECTED Final   Cryptococcus neoformans/gattii NOT DETECTED NOT DETECTED Final   Meth resistant mecA/C and MREJ NOT DETECTED NOT DETECTED Final    Comment: Performed at [facility], [address], [place], [place] [address]  Culture, blood (routine x 2)     Status: None (Preliminary result)   Collection Time: 03/29/23  5:29 PM   Specimen: BLOOD  Result Value Ref Range Status   Specimen Description BLOOD RFOA  Final   Special Requests   Final    BOTTLES DRAWN AEROBIC AND ANAEROBIC Blood Culture results may not be optimal due to an inadequate volume of blood received in culture bottles   Culture  Setup Time   Final    GRAM POSITIVE COCCI ANAEROBIC BOTTLE ONLY Gram Stain Report Called to,Read Back By and Verified With: DOROTHA NED @MC  [phone], VIRAY,J Performed at [facility], [address], [place], [place] [address]    Culture GRAM POSITIVE COCCI  Final   Report Status PENDING  Incomplete  Culture, blood (routine x 2)     Status: None (Preliminary result)   Collection Time: 03/29/23  5:35 PM   Specimen: BLOOD LEFT FOREARM  Result Value Ref Range Status   Specimen Description BLOOD LEFT FOREARM  Final   Special Requests   Final    BOTTLES DRAWN AEROBIC ONLY  Blood Culture results may not be optimal due to an inadequate volume of blood received in culture bottles   Culture   Final    NO GROWTH 2 DAYS Performed at [facility], [address], [place], [place] [address]    Report Status PENDING  Incomplete    Corean Fireman, MSN, NP-C [facility]  [email or web address] Pager: [phone] Office: [phone] [facility] Main Line: [phone] *Secure Chat Communication Welcome

## 2023-03-31 NOTE — Plan of Care (Signed)

## 2023-03-31 NOTE — Consult Note (Signed)
 Reason for Consult:prevertebral edema Referring Physician: Dr Royal Debbie Goodwin is an 53 y.o. female.  HPI: hx of multilevel cervical fusion in 2022. She has no had any recent increased pain in neck or difficulty turning head. She fell recently and developed fever Monday. She had severe lower back pain.she had dx of epidural abscess. MRI shoiws thickening of the prevertebral space over the plate area and down to thoracic inlet. She has no dysphagia or odynophagia. Turns head normally for her.   Past Medical History:  Diagnosis Date   Anxiety    Fibromyalgia    GERD (gastroesophageal reflux disease)    HLD (hyperlipidemia)    Hypertension    Status post dilation of esophageal narrowing    Thyroid  disease     Past Surgical History:  Procedure Laterality Date   CARPAL TUNNEL RELEASE Bilateral 2016   CHOLECYSTECTOMY     LASIK Bilateral    PLANTAR FASCIA SURGERY Right 1998    Family History  Problem Relation Age of Onset   Hypertension Mother    Hyperlipidemia Mother    Colon polyps Father    Heart attack Father    Hypertension Father    Hyperlipidemia Father    Heart attack Maternal Grandmother    Stroke Paternal Grandfather    Colon cancer Neg Hx    Esophageal cancer Neg Hx     Social History:  reports that she has never smoked. She has never used smokeless tobacco. She reports that she does not drink alcohol  and does not use drugs.  Allergies:  Allergies  Allergen Reactions   Sulfa Antibiotics Hives    Medications: I have reviewed the patient's current medications.  Results for orders placed or performed during the hospital encounter of 03/29/23 (from the past 48 hours)  Comprehensive metabolic panel     Status: Abnormal   Collection Time: 03/29/23  5:29 PM  Result Value Ref Range   Sodium 131 (L) 135 - 145 mmol/L   Potassium 2.7 (LL) 3.5 - 5.1 mmol/L    Comment: CRITICAL RESULT CALLED TO, READ BACK BY AND VERIFIED WITH EDWARDS,C ON 03/29/23 AT 1820 BY LOY,C    Chloride 97 (L) 98 - 111 mmol/L   CO2 22 22 - 32 mmol/L   Glucose, Bld 193 (H) 70 - 99 mg/dL    Comment: Glucose reference range applies only to samples taken after fasting for at least 8 hours.   BUN 12 6 - 20 mg/dL   Creatinine, Ser 9.42 0.44 - 1.00 mg/dL   Calcium 8.0 (L) 8.9 - 10.3 mg/dL   Total Protein 6.9 6.5 - 8.1 g/dL   Albumin 3.3 (L) 3.5 - 5.0 g/dL   AST 42 (H) 15 - 41 U/L   ALT 50 (H) 0 - 44 U/L   Alkaline Phosphatase 60 38 - 126 U/L   Total Bilirubin 1.3 (H) 0.0 - 1.2 mg/dL   GFR, Estimated >39 >39 mL/min    Comment: (NOTE) Calculated using the CKD-EPI Creatinine Equation (2021)    Anion gap 12 5 - 15    Comment: Performed at [facility], [address], [place], [place] [address]  CBC with Differential     Status: Abnormal   Collection Time: 03/29/23  5:29 PM  Result Value Ref Range   WBC 8.9 4.0 - 10.5 K/uL   RBC 3.58 (L) 3.87 - 5.11 MIL/uL   Hemoglobin 10.5 (L) 12.0 - 15.0 g/dL   HCT 68.3 (L) 63.9 - 53.9 %   MCV  88.3 80.0 - 100.0 fL   MCH 29.3 26.0 - 34.0 pg   MCHC 33.2 30.0 - 36.0 g/dL   RDW 86.9 88.4 - 84.4 %   Platelets 131 (L) 150 - 400 K/uL   nRBC 0.0 0.0 - 0.2 %   Neutrophils Relative % 70 %   Neutro Abs 6.2 1.7 - 7.7 K/uL   Lymphocytes Relative 17 %   Lymphs Abs 1.5 0.7 - 4.0 K/uL   Monocytes Relative 12 %   Monocytes Absolute 1.1 (H) 0.1 - 1.0 K/uL   Eosinophils Relative 0 %   Eosinophils Absolute 0.0 0.0 - 0.5 K/uL   Basophils Relative 0 %   Basophils Absolute 0.0 0.0 - 0.1 K/uL   Immature Granulocytes 1 %   Abs Immature Granulocytes 0.05 0.00 - 0.07 K/uL    Comment: Performed at [facility], [address], [place], [place] [address]  Sedimentation rate     Status: Abnormal   Collection Time: 03/29/23  5:29 PM  Result Value Ref Range   Sed Rate 55 (H) 0 - 22 mm/hr    Comment: Performed at [facility], [address], [place], [place] [address]  C-reactive protein     Status: Abnormal   Collection Time: 03/29/23  5:29 PM  Result Value Ref  Range   CRP 21.8 (H) <1.0 mg/dL    Comment: Performed at [facility], [address], [place], [place] [address]  Lactic acid, plasma     Status: None   Collection Time: 03/29/23  5:29 PM  Result Value Ref Range   Lactic Acid, Venous 1.4 0.5 - 1.9 mmol/L    Comment: Performed at [facility], [address] [place], [place] [address]  Culture, blood (routine x 2)     Status: None (Preliminary result)   Collection Time: 03/29/23  5:29 PM   Specimen: BLOOD  Result Value Ref Range   Specimen Description BLOOD RFOA    Special Requests      BOTTLES DRAWN AEROBIC AND ANAEROBIC Blood Culture results may not be optimal due to an inadequate volume of blood received in culture bottles   Culture  Setup Time      GRAM POSITIVE COCCI ANAEROBIC BOTTLE ONLY Gram Stain Report Called to,Read Back By and Verified With: DOROTHA NED @MC  [phone], VIRAY,J Performed at [facility], [address], [place], [place] [address]    Culture GRAM POSITIVE COCCI    Report Status PENDING   Hemoglobin A1c     Status: Abnormal   Collection Time: 03/29/23  5:29 PM  Result Value Ref Range   Hgb A1c MFr Bld 7.7 (H) 4.8 - 5.6 %    Comment: (NOTE)         Prediabetes: 5.7 - 6.4         Diabetes: >6.4         Glycemic control for adults with diabetes: <7.0    Mean Plasma Glucose 174 mg/dL    Comment: (NOTE) Performed At: [facility] [address] [place], [place] [id number] Jennette Shorter MD Ey:[id number]   HIV Antibody (routine testing w rflx)     Status: None   Collection Time: 03/29/23  5:29 PM  Result Value Ref Range   HIV Screen 4th Generation wRfx Non Reactive Non Reactive    Comment: Performed at [facility], [address], [place], [place] [address]  Procalcitonin     Status: None   Collection Time: 03/29/23  5:29 PM  Result Value Ref Range   Procalcitonin 1.44 ng/mL    Comment:        Interpretation: PCT > 0.5 ng/mL and <= 2 ng/mL: Systemic infection (sepsis) is  possible, but other conditions are known to elevate PCT as well. (NOTE)       Sepsis PCT Algorithm           Lower Respiratory Tract                                      Infection PCT Algorithm    ----------------------------     ----------------------------         PCT < 0.25 ng/mL                PCT < 0.10 ng/mL          Strongly encourage             Strongly discourage   discontinuation of antibiotics    initiation of antibiotics    ----------------------------     -----------------------------       PCT 0.25 - 0.50 ng/mL            PCT 0.10 - 0.25 ng/mL               OR       >80% decrease in PCT            Discourage initiation of                                            antibiotics      Encourage discontinuation           of antibiotics    ----------------------------     -----------------------------         PCT >= 0.50 ng/mL              PCT 0.26 - 0.50 ng/mL                AND       <80% decrease in PCT             Encourage initiation of                                             antibiotics       Encourage continuation           of antibiotics    ----------------------------     -----------------------------        PCT >= 0.50 ng/mL                  PCT > 0.50 ng/mL               AND         increase in PCT                  Strongly encourage                                      initiation of antibiotics    Strongly encourage escalation  of antibiotics                                     -----------------------------                                           PCT <= 0.25 ng/mL                                                 OR                                        > 80% decrease in PCT                                      Discontinue / Do not initiate                                             antibiotics  Performed at [facility], [address] [place], [place] [address]   Magnesium      Status: Abnormal   Collection Time: 03/29/23  5:29 PM   Result Value Ref Range   Magnesium  1.6 (L) 1.7 - 2.4 mg/dL    Comment: Performed at [facility], [address] [place], [place] [address]  Culture, blood (routine x 2)     Status: None (Preliminary result)   Collection Time: 03/29/23  5:35 PM   Specimen: BLOOD LEFT FOREARM  Result Value Ref Range   Specimen Description BLOOD LEFT FOREARM    Special Requests      BOTTLES DRAWN AEROBIC ONLY Blood Culture results may not be optimal due to an inadequate volume of blood received in culture bottles   Culture      NO GROWTH 2 DAYS Performed at [facility], [address] [place], [place] [address]    Report Status PENDING   CBG monitoring, ED     Status: Abnormal   Collection Time: 03/29/23  9:13 PM  Result Value Ref Range   Glucose-Capillary 167 (H) 70 - 99 mg/dL    Comment: Glucose reference range applies only to samples taken after fasting for at least 8 hours.  Basic metabolic panel     Status: Abnormal   Collection Time: 03/30/23  5:08 AM  Result Value Ref Range   Sodium 136 135 - 145 mmol/L   Potassium 3.3 (L) 3.5 - 5.1 mmol/L   Chloride 104 98 - 111 mmol/L   CO2 23 22 - 32 mmol/L   Glucose, Bld 156 (H) 70 - 99 mg/dL    Comment: Glucose reference range applies only to samples taken after fasting for at least 8 hours.   BUN 11 6 - 20 mg/dL   Creatinine, Ser 9.45 0.44 - 1.00 mg/dL   Calcium 8.1 (L) 8.9 - 10.3 mg/dL   GFR, Estimated >39 >39 mL/min    Comment: (NOTE)  Calculated using the CKD-EPI Creatinine Equation (2021)    Anion gap 9 5 - 15    Comment: Performed at [facility], [address], [place], [place] [address]  CBC     Status: Abnormal   Collection Time: 03/30/23  5:08 AM  Result Value Ref Range   WBC 8.3 4.0 - 10.5 K/uL   RBC 3.53 (L) 3.87 - 5.11 MIL/uL   Hemoglobin 10.3 (L) 12.0 - 15.0 g/dL   HCT 67.6 (L) 63.9 - 53.9 %   MCV 91.5 80.0 - 100.0 fL   MCH 29.2 26.0 - 34.0 pg   MCHC 31.9 30.0 - 36.0 g/dL   RDW 86.7 88.4 - 84.4 %   Platelets 118 (L) 150 - 400  K/uL   nRBC 0.0 0.0 - 0.2 %    Comment: Performed at [facility], [address] [place], [place] [address]  Strep pneumoniae urinary antigen     Status: None   Collection Time: 03/30/23  7:30 AM  Result Value Ref Range   Strep Pneumo Urinary Antigen NEGATIVE NEGATIVE    Comment:        Infection due to S. pneumoniae cannot be absolutely ruled out since the antigen present may be below the detection limit of the test. Performed at [facility], [address] [place], [place] [address]   CBG monitoring, ED     Status: Abnormal   Collection Time: 03/30/23  7:40 AM  Result Value Ref Range   Glucose-Capillary 155 (H) 70 - 99 mg/dL    Comment: Glucose reference range applies only to samples taken after fasting for at least 8 hours.  CBG monitoring, ED     Status: Abnormal   Collection Time: 03/30/23  1:09 PM  Result Value Ref Range   Glucose-Capillary 128 (H) 70 - 99 mg/dL    Comment: Glucose reference range applies only to samples taken after fasting for at least 8 hours.   Comment 1 Notify RN    Comment 2 Document in Chart   Glucose, capillary     Status: Abnormal   Collection Time: 03/30/23  4:41 PM  Result Value Ref Range   Glucose-Capillary 133 (H) 70 - 99 mg/dL    Comment: Glucose reference range applies only to samples taken after fasting for at least 8 hours.  Glucose, capillary     Status: Abnormal   Collection Time: 03/30/23  8:54 PM  Result Value Ref Range   Glucose-Capillary 133 (H) 70 - 99 mg/dL    Comment: Glucose reference range applies only to samples taken after fasting for at least 8 hours.  Hepatitis C antibody     Status: None   Collection Time: 03/31/23  6:57 AM  Result Value Ref Range   HCV Ab NON REACTIVE NON REACTIVE    Comment: (NOTE) Nonreactive HCV antibody screen is consistent with no HCV infections,  unless recent infection is suspected or other evidence exists to indicate HCV infection.  Performed at [facility], [address] [place], [place] [address]   Hepatitis B surface antigen     Status: None   Collection Time: 03/31/23  6:57 AM  Result Value Ref Range   Hepatitis B Surface Ag NON REACTIVE NON REACTIVE    Comment: Performed at [facility], [address] [place], [place] [address]  Hepatitis B core antibody, total     Status: None   Collection Time: 03/31/23  6:57 AM  Result Value Ref  Range   Hep B Core Total Ab NON REACTIVE NON REACTIVE    Comment: Performed at [facility], [address] [place], [place] [address]  Basic metabolic panel     Status: Abnormal   Collection Time: 03/31/23  6:57 AM  Result Value Ref Range   Sodium 131 (L) 135 - 145 mmol/L   Potassium 3.5 3.5 - 5.1 mmol/L   Chloride 95 (L) 98 - 111 mmol/L   CO2 22 22 - 32 mmol/L   Glucose, Bld 283 (H) 70 - 99 mg/dL    Comment: Glucose reference range applies only to samples taken after fasting for at least 8 hours.   BUN 9 6 - 20 mg/dL   Creatinine, Ser 9.15 0.44 - 1.00 mg/dL   Calcium 8.0 (L) 8.9 - 10.3 mg/dL   GFR, Estimated >39 >39 mL/min    Comment: (NOTE) Calculated using the CKD-EPI Creatinine Equation (2021)    Anion gap 14 5 - 15    Comment: Performed at [facility], [address] [place], [place] [address]  Magnesium      Status: Abnormal   Collection Time: 03/31/23  6:57 AM  Result Value Ref Range   Magnesium  1.6 (L) 1.7 - 2.4 mg/dL    Comment: Performed at [facility], [address], [place], [place] [address]  Glucose, capillary     Status: Abnormal   Collection Time: 03/31/23  7:34 AM  Result Value Ref Range   Glucose-Capillary 140 (H) 70 - 99 mg/dL    Comment: Glucose reference range applies only to samples taken after fasting for at least 8 hours.  Glucose, capillary     Status: Abnormal   Collection Time: 03/31/23 11:22 AM  Result Value Ref Range   Glucose-Capillary 105 (H) 70 - 99 mg/dL    Comment: Glucose reference range applies only to samples taken after fasting for at least  8 hours.    ECHOCARDIOGRAM COMPLETE Result Date: 03/30/2023    ECHOCARDIOGRAM REPORT   Patient Name:   LAKENYA RIENDEAU Date of Exam: 03/30/2023 Medical Rec #:  [id number]      Height:       63.0 in Accession #:    [id number]     Weight:       276.0 lb Date of Birth:  12-11-1970      BSA:          2.217 m Patient Age:    52 years       BP:           119/64 mmHg Patient Gender: F              HR:           81 bpm. Exam Location:  [facility] Procedure: 2D Echo, Cardiac Doppler and Color Doppler Indications:    Bacteremia R78.81  History:        Patient has no prior history of Echocardiogram examinations.                 Risk Factors:Hypertension, Dyslipidemia and Sleep Apnea.  Sonographer:    Aida Pizza RCS Referring Phys: [id number] [facility] IMPRESSIONS  1. Left ventricular ejection fraction, by estimation, is 60 to 65%. The left ventricle has normal function. The left ventricle has no regional wall motion abnormalities. Left ventricular diastolic parameters are indeterminate.  2. Right ventricular systolic function is normal. The right ventricular size is normal.  3. The mitral valve is abnormal. Mild  mitral valve regurgitation. No evidence of mitral stenosis.  4. The aortic valve was not well visualized. Aortic valve regurgitation is not visualized. No aortic stenosis is present.  5. The inferior vena cava is normal in size with greater than 50% respiratory variability, suggesting right atrial pressure of 3 mmHg. FINDINGS  Left Ventricle: Left ventricular ejection fraction, by estimation, is 60 to 65%. The left ventricle has normal function. The left ventricle has no regional wall motion abnormalities. The left ventricular internal cavity size was normal in size. There is  no left ventricular hypertrophy. Left ventricular diastolic parameters are indeterminate. Right Ventricle: The right ventricular size is normal. Right vetricular wall thickness was not well visualized. Right ventricular systolic function is  normal. Left Atrium: Left atrial size was normal in size. Right Atrium: Right atrial size was normal in size. Pericardium: There is no evidence of pericardial effusion. Mitral Valve: The mitral valve is abnormal. Mild mitral valve regurgitation. No evidence of mitral valve stenosis. Tricuspid Valve: The tricuspid valve is normal in structure. Tricuspid valve regurgitation is not demonstrated. No evidence of tricuspid stenosis. Aortic Valve: The aortic valve was not well visualized. Aortic valve regurgitation is not visualized. No aortic stenosis is present. Aortic valve mean gradient measures 5.1 mmHg. Aortic valve peak gradient measures 9.4 mmHg. Aortic valve area, by VTI measures 2.35 cm. Pulmonic Valve: The pulmonic valve was not well visualized. Pulmonic valve regurgitation is not visualized. No evidence of pulmonic stenosis. Aorta: The aortic root is normal in size and structure. Venous: The inferior vena cava is normal in size with greater than 50% respiratory variability, suggesting right atrial pressure of 3 mmHg. IAS/Shunts: No atrial level shunt detected by color flow Doppler.  LEFT VENTRICLE PLAX 2D LVIDd:         5.20 cm   Diastology LVIDs:         3.20 cm   LV e' medial:    8.59 cm/s LV PW:         0.90 cm   LV E/e' medial:  16.4 LV IVS:        0.90 cm   LV e' lateral:   7.51 cm/s LVOT diam:     2.00 cm   LV E/e' lateral: 18.8 LV SV:         77 LV SV Index:   35 LVOT Area:     3.14 cm  RIGHT VENTRICLE RV S prime:     16.85 cm/s TAPSE (M-mode): 2.5 cm LEFT ATRIUM             Index        RIGHT ATRIUM           Index LA diam:        4.20 cm 1.89 cm/m   RA Area:     14.90 cm LA Vol (A2C):   65.1 ml 29.36 ml/m  RA Volume:   36.80 ml  16.60 ml/m LA Vol (A4C):   64.3 ml 29.00 ml/m LA Biplane Vol: 65.0 ml 29.31 ml/m  AORTIC VALVE AV Area (Vmax):    2.48 cm AV Area (Vmean):   2.39 cm AV Area (VTI):     2.35 cm AV Vmax:           153.11 cm/s AV Vmean:          107.476 cm/s AV VTI:            0.329 m  AV Peak Grad:      9.4 mmHg  AV Mean Grad:      5.1 mmHg LVOT Vmax:         121.00 cm/s LVOT Vmean:        81.800 cm/s LVOT VTI:          0.246 m LVOT/AV VTI ratio: 0.75  AORTA Ao Root diam: 3.20 cm MITRAL VALVE MV Area (PHT): 5.13 cm     SHUNTS MV Decel Time: 148 msec     Systemic VTI:  0.25 m MV E velocity: 141.00 cm/s  Systemic Diam: 2.00 cm MV A velocity: 120.00 cm/s MV E/A ratio:  1.18 Dorn Ross MD Electronically signed by Dorn Ross MD Signature Date/Time: 03/30/2023/3:10:55 PM    Final    MR THORACIC SPINE W WO CONTRAST Result Date: 03/30/2023 CLINICAL DATA:  Mid back pain with infection suspected EXAM: MRI THORACIC WITHOUT AND WITH CONTRAST TECHNIQUE: Multiplanar and multiecho pulse sequences of the thoracic spine were obtained without and with intravenous contrast. CONTRAST:  10mL GADAVIST  GADOBUTROL  1 MMOL/ML IV SOLN COMPARISON:  Lumbar MRI from earlier today. FINDINGS: Alignment:  Normal Vertebrae: Marrow edema at the right T11-12 facet is better seen on prior lumbar MRI. There is regional edema in the back musculature with posterior epidural collection at T11-12 measuring 18 x 7 mm with posterior cord contact and crowding. No discitis or focal bone lesion. A small collection is also seen posterior to the right facet of T11-12, measuring 8 mm on axial postcontrast images. Cord: Crowding from behind at the level of epidural abscess. No cord edema. Paraspinal and other soft tissues: As above Disc levels: Mild spondylitic and facet spurring. No bony impingement in the thoracic spine. IMPRESSION: Confirmed posterior epidural collection at T11-12 with mass effect on the posterior cord, consistent with abscess in this setting and measuring 18 x 7 mm. 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 Electronically Signed   By: Dorn Roulette M.D.   On: 03/30/2023 08:59   MR LUMBAR SPINE WO CONTRAST Result Date:  03/30/2023 CLINICAL DATA:  Lumbar radiculopathy with infection suspected. Bacteremia, intrathecal lumbar steroid injection with lower back pain. EXAM: MRI LUMBAR SPINE WITHOUT CONTRAST TECHNIQUE: Multiplanar, multisequence MR imaging of the lumbar spine was performed. No intravenous contrast was administered. COMPARISON:  02/23/2021 FINDINGS: Segmentation:  Standard. Alignment:  Physiologic. Vertebrae: Marrow edema at the right T11-12 facet. There is a posterior epidural collection at T11-12 only seen on sagittal imaging, 18 x 8 mm on sagittal STIR images, suspicious for abscess in this clinical setting and with the adjacent facet arthritis that may be septic. Conus medullaris and cauda equina: Conus extends to the L1-2 level. Conus and cauda equina appear normal. Paraspinal and other soft tissues: Soft tissue edema around the inflamed right T11-12 facet. Disc levels: T12- L1: Unremarkable. L1-L2: Minor disc bulging L2-L3: Disc height loss and bulging with small central protrusion. Negative facets L3-L4: Disc narrowing and bulging with right paracentral upward pointing herniation. Degenerative facet spurring ligamentum flavum thickening. Moderate spinal stenosis. L4-L5: Disc narrowing and bulging with left foraminal protrusion. Asymmetric left facet spurring. Moderate left foraminal stenosis. L5-S1:Disc narrowing and bulging with left paracentral protrusion impinging on the left S1 nerve root. Mild left foraminal stenosis. Prelim sent in epic chat. IMPRESSION: 1. Posterior epidural collection at T11-12, suspicious for epidural abscess in this clinical setting and with adjacent right T11-12 facet arthritis. Recommend thoracic MRI with and without contrast. 2. Lumbar spine degeneration as described. Moderate spinal  stenosis at L3-4. Electronically Signed   By: Dorn Roulette M.D.   On: 03/30/2023 07:16   DG Chest Portable 1 View Result Date: 03/29/2023 CLINICAL DATA:  Known positive blood cultures EXAM: PORTABLE  CHEST 1 VIEW COMPARISON:  CT from earlier in the same day. FINDINGS: Cardiac shadow is within normal limits. Mild vascular congestion is again seen. No focal confluent infiltrate is noted. No bony abnormality is seen. Surgical changes are noted in the cervical spine. IMPRESSION: Stable vascular congestion.  No focal infiltrate is seen. Electronically Signed   By: Oneil Devonshire M.D.   On: 03/29/2023 18:40    ROS Blood pressure (!) 160/69, pulse 93, temperature (!) 100.5 F (38.1 C), temperature source Oral, resp. rate 15, height 5' 3 (1.6 m), weight 125.2 kg, last menstrual period 07/26/2020, SpO2 98%. Physical Exam HENT:     Head: Normocephalic.     Right Ear: External ear normal.     Left Ear: External ear normal.     Nose: Nose normal.     Mouth/Throat:     Mouth: Mucous membranes are moist.  Eyes:     Pupils: Pupils are equal, round, and reactive to light.  Musculoskeletal:     Cervical back: Normal range of motion.  Neurological:     Mental Status: She is alert.       Assessment/Plan: Prevertebral edema- Im not sure the etiology but the epicenter is over the hardware in the neck. She has no clinical symptoms of retropharyngeal infection of stiffness of neck, pain in neck, difficulty eating, or tenderness. I think I would have to defer to neurosurgery regarding the plate and screws as the etiology. I can see no indication to intervene into the neck surgically for the edema at this time.   Debbie Goodwin 03/31/2023, 1:43 PM

## 2023-03-31 NOTE — Progress Notes (Signed)
 HOSPITALIST                ROUNDING                 NOTE Donesha Wallander FMW:[id number]  DOB: [birth year]  DOA: 03/29/2023  PCP: Bertell Satterfield, MD  03/31/2023,9:18 AM   LOS: 2 days      Code Status: Full code presumed From: Home  current Dispo: Likely home     53 year old white female Known colonic diverticuli followed by Dr. 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 Darlis affiliated with [place] [facility]  Patient came to [facility] ED after having 2 months of increasing weakness with ambulation difficulty getting around and instability on her hips and legs She had been finding it more difficult to move around and had pain radiating from around her thoracic spine around to her abdomen She presented with accidental fall with new  fever-- chest pain back pain 03/28/23 Tmax 102.6-COVID flu were negative CT head negative CT cervical spine prevertebral soft tissue swelling urine analysis unremarkable Toradol  given with improvement-MRI was done and showed fluid collection skull to T4 She was sent home with azithromycin  and amoxicillin  but called back because blood cultures grew MSSA in 2 bottles  workup CT = trace pleural effusions dependent opacities atelectasis versus pneumonia-hepatomegaly with steatosis?  Cirrhosis?  Small bowel thickening edema cervical prevertebral soft tissue fluid edema in the upper thorax MRI showed prevertebral effusion from skull base to T4 --13 mm at C3-C4-C7 ACDF without residual spinal canal neuroforaminal stenosis L-spine MRI posterior epidural collection T11-T12 suspicious for epidural abscess MRI thoracic spine posterior epidural  collection T11-12 with mass effect on posterior cord 18 x 7 cm  WBC 8.9 lactic acid 1.5 platelet 131  potassium 2.7 ESR 55 CRP 21 UA unremarkable for UTI  Infectious diseases telemetry consulted-patient sent to [facility] for [facility]TE further workup 1/3 neurosurgery consulted 1/3 ENT consulted   Plan  MSSA infection-T11-T12 epidural abscess mass effect 18X 7 mm---Skull base to T4 fluid 13 mmc ollection in setting ACDF Neurosurgery Dr. Mavis has seen patient - positive cultures should treat through and watch for neurovascular compromise which would be the only indication for thoracic surgery For the fluid collection at the skull base he recommends talking to ENT and I have contacted Dr. Roark who will see the patient Continue cefazolin  monotherapy currently ID following-echocardiogram pending Probably will add rifaximin given underlying hardware or as per ID  Chronic reflux dysphagia hiatal hernia's Gatzke ring Continue Protonix  40 daily  Chronic pain secondary to back pain Continues on Wellbutrin  300 gabapentin  900 daily, Cymbalta  60, amitriptyline  variable doses Continue oxycodone  10 every 6 as needed pain, Flexeril  10 3 times daily as needed spasm Continue this time morphine  2 mg every 2 as needed severe pain  HTN Continues on a VaPro 37.5 hydralazine  5 every 4 as needed blood pressure is elevated  Depression Continues on Xanax  0.25 3 times daily as neede  Class IV obesity BMI >48 DM TY 2 Resume metformin  1000 twice daily-at this time would hold phentermine-continues on sliding scale coverage moderate with at bedtime--- blood sugar is not controlled at this time and might need to add long-acting insulin  additionally but will see with resumption  of metformin  how she does Resume nighttime CPAP as per orders  Reflux GERD as well as prior hiatal hernia and Schatzki ring Continue Pepcid  20 daily, Protonix  40 daily  DVT prophylaxis: SCD  Status is: Inpatient Remains inpatient  appropriate because:   Requires further workup   Subjective: Pain is moderate she is using ice packs she was able to get up to bedside commode but with difficulty and discomfort which is not her usual She has no cough or cold She has no chest pain-no tingling in upper extremities no overt weakness Main issue is back pain in the middle thoracic region  Objective + exam Vitals:   03/30/23 1530 03/30/23 2058 03/31/23 0615 03/31/23 0815  BP: (!) 144/73 (!) 144/75 125/71 (!) 160/69  Pulse: 89 91 90 93  Resp: 17 18  15   Temp: 99.3 F (37.4 C) 99.6 F (37.6 C) 99.8 F (37.7 C) (!) 100.5 F (38.1 C)  TempSrc: Oral Oral Oral Oral  SpO2: 100% 99% 98% 98%  Weight:      Height:       Filed Weights   03/29/23 1605  Weight: 125.2 kg    Examination: EOMI NCAT thick neck Mallampati 4 no icterus no pallor S1-S2 no murmur ROM intact moving 4 limbs equally no tingling in upper extremities Abdomen obese nontender no rebound no guarding Neurologically moving all 4 limbs without deficit   Data Reviewed: reviewed   CBC    Component Value Date/Time   WBC 8.3 03/30/2023 0508   RBC 3.53 (L) 03/30/2023 0508   HGB 10.3 (L) 03/30/2023 0508   HCT 32.3 (L) 03/30/2023 0508   PLT 118 (L) 03/30/2023 0508   MCV 91.5 03/30/2023 0508   MCH 29.2 03/30/2023 0508   MCHC 31.9 03/30/2023 0508   RDW 13.2 03/30/2023 0508   LYMPHSABS 1.5 03/29/2023 1729   MONOABS 1.1 (H) 03/29/2023 1729   EOSABS 0.0 03/29/2023 1729   BASOSABS 0.0 03/29/2023 1729      Latest Ref Rng & Units 03/31/2023    6:57 AM 03/30/2023    5:08 AM 03/29/2023    5:29 PM  CMP  Glucose 70 - 99 mg/dL 716  843  806   BUN 6 - 20 mg/dL 9  11  12    Creatinine 0.44 - 1.00 mg/dL 9.15  9.45  9.42   Sodium 135 - 145 mmol/L 131  136  131   Potassium 3.5 - 5.1 mmol/L 3.5  3.3  2.7   Chloride 98 - 111 mmol/L 95  104  97   CO2 22 - 32 mmol/L 22  23  22    Calcium 8.9 - 10.3 mg/dL 8.0  8.1  8.0   Total Protein 6.5 - 8.1 g/dL   6.9   Total  Bilirubin 0.0 - 1.2 mg/dL   1.3   Alkaline Phos 38 - 126 U/L   60   AST 15 - 41 U/L   42   ALT 0 - 44 U/L   50     Scheduled Meds:  amitriptyline   25-50 mg Oral QHS   buPROPion   300 mg Oral Daily   DULoxetine   60 mg Oral Daily   famotidine   20 mg Oral QHS   gabapentin   900 mg Oral Daily   insulin  aspart  0-15 Units Subcutaneous TID WC   insulin  aspart  0-5 Units Subcutaneous QHS   irbesartan   37.5 mg Oral Daily   levothyroxine   100 mcg Oral Q0600   metFORMIN   1,000  mg Oral BID WC   pantoprazole   40 mg Oral Daily   rOPINIRole   3 mg Oral Daily   simvastatin   40 mg Oral q1800   Continuous Infusions:   ceFAZolin  (ANCEF ) IV 2 g (03/31/23 0605)    Time 50  Colen Grimes, MD  [facility]

## 2023-03-31 NOTE — Progress Notes (Signed)
   03/31/23 2142  BiPAP/CPAP/SIPAP  Reason BIPAP/CPAP not in use Other(comment) (Pt has home CPAP, pt states she can place herself.)

## 2023-03-31 NOTE — Consult Note (Signed)
 Reason for Consult: Thoracic epidural abscess Referring Physician: Dr. Royal [name] is an 53 y.o. female.  HPI: The patient is a 53 year old white female known to me secondary to both anterior posterior cervical surgeries years ago.  She has had chronic back pain and has had lumbar injections by Dr. Darlis, our pain doctor.  She believes last injection was back in October.  She tells me she is in the process of applying for Social Security disability because of her chronic back pain.  The patient was admitted with bacteremia.  Cultures have grown staph.  She had a lumbar cervical and thoracic MRI.  The cervical and lumbar MRI are fairly unremarkable.  Her thoracic MRI demonstrates a small thoracic epidural abscess at T11-12.  ID has seen her.  She is on cefazolin .  Presently the patient is alert and pleasant.  She complains of pain at the thoracolumbar junction rating towards the right ribs.  He has no lower extremity numbness tingling or weakness.  She does not recall any infections preceding this hospitalization.  Past Medical History:  Diagnosis Date   Anxiety    Fibromyalgia    GERD (gastroesophageal reflux disease)    HLD (hyperlipidemia)    Hypertension    Status post dilation of esophageal narrowing    Thyroid  disease     Past Surgical History:  Procedure Laterality Date   CARPAL TUNNEL RELEASE Bilateral 2016   CHOLECYSTECTOMY     LASIK Bilateral    PLANTAR FASCIA SURGERY Right 1998    Family History  Problem Relation Age of Onset   Hypertension Mother    Hyperlipidemia Mother    Colon polyps Father    Heart attack Father    Hypertension Father    Hyperlipidemia Father    Heart attack Maternal Grandmother    Stroke Paternal Grandfather    Colon cancer Neg Hx    Esophageal cancer Neg Hx     Social History:  reports that she has never smoked. She has never used smokeless tobacco. She reports that she does not drink alcohol  and does not use  drugs.  Allergies:  Allergies  Allergen Reactions   Sulfa Antibiotics Hives    Medications: I have reviewed the patient's current medications. Prior to Admission:  Medications Prior to Admission  Medication Sig Dispense Refill Last Dose/Taking   ALPRAZolam  (XANAX ) 0.25 MG tablet Take 0.25 mg by mouth 3 (three) times daily as needed for anxiety.   03/26/2023   amitriptyline  (ELAVIL ) 25 MG tablet Take 25-50 mg by mouth at bedtime.   03/26/2023   azithromycin  (ZITHROMAX  Z-PAK) 250 MG tablet Take 1 tablet (250 mg total) by mouth daily. 500mg  PO day 1, then 250mg  PO days 205 6 tablet 0 03/20/2023   betamethasone  dipropionate 0.05 % cream Apply 1 Application topically 2 (two) times daily as needed (rash).   Past Month   buPROPion  (WELLBUTRIN  XL) 300 MG 24 hr tablet Take 300 mg by mouth daily.   03/26/2023   Cholecalciferol (VITAMIN D-3 PO) Take 1 capsule by mouth daily.   03/26/2023   cyclobenzaprine  (FLEXERIL ) 10 MG tablet Take 10 mg by mouth 3 (three) times daily as needed for muscle spasms.   03/24/2023   DULoxetine  (CYMBALTA ) 60 MG capsule Take 60 mg by mouth daily.   03/26/2023   famotidine  (PEPCID ) 20 MG tablet Take 1 tablet (20 mg total) by mouth at bedtime. 30 tablet 3 03/26/2023   Flaxseed, Linseed, (FLAXSEED OIL PO) Take 1 capsule by mouth daily.  03/26/2023   gabapentin  (NEURONTIN ) 300 MG capsule Take 900 mg by mouth daily.   03/26/2023   ibuprofen (ADVIL,MOTRIN) 200 MG tablet Take 800 mg by mouth every 6 (six) hours as needed for moderate pain.   03/26/2023   ketoconazole  (NIZORAL ) 2 % cream Apply topically 2 (two) times daily.   Past Month   levothyroxine  (SYNTHROID ) 100 MCG tablet Take 100 mcg by mouth daily.   03/26/2023   metFORMIN  (GLUCOPHAGE ) 1000 MG tablet Take 1,000 mg by mouth 2 (two) times daily.   03/28/2023 Bedtime   mometasone (ELOCON) 0.1 % ointment Apply 1 Application topically daily.   03/28/2023 Bedtime   olmesartan  (BENICAR ) 40 MG tablet Take 40 mg by mouth daily.    03/26/2023   Omega-3 Fatty Acids (FISH OIL) 300 MG CAPS Take 1 capsule by mouth daily.   03/26/2023   omeprazole (PRILOSEC) 40 MG capsule Take 40 mg by mouth daily.   03/26/2023   Oxycodone  HCl 10 MG TABS Take 1 tablet by mouth every 6 (six) hours as needed (pain).   03/26/2023   pantoprazole  (PROTONIX ) 40 MG tablet Take 40 mg by mouth daily.   03/26/2023   polyvinyl alcohol  (LIQUIFILM TEARS) 1.4 % ophthalmic solution Place 1 drop into both eyes as needed for dry eyes.   03/26/2023   rOPINIRole  (REQUIP ) 1 MG tablet Take 1 mg by mouth as needed (restless legs).   Past Month   rOPINIRole  (REQUIP ) 3 MG tablet Take 3 mg by mouth daily.   03/26/2023   simvastatin  (ZOCOR ) 40 MG tablet Take 40 mg by mouth daily.   03/26/2023   Zinc Acetate, Oral, (ZINC ACETATE PO) Take 1 tablet by mouth daily.   03/26/2023   amoxicillin  (AMOXIL ) 500 MG capsule Take 1 capsule (500 mg total) by mouth 3 (three) times daily. (Patient not taking: Reported on 03/29/2023) 30 capsule 0 Not Taking   Blood Glucose Monitoring Suppl DEVI 1 each by Does not apply route in the morning, at noon, and at bedtime. May substitute to any manufacturer covered by patient's insurance. 1 each 0    fluconazole  (DIFLUCAN ) 150 MG tablet Take 1 tablet (150 mg total) by mouth every three (3) days as needed (yeast infection treatment/prevention). (Patient not taking: Reported on 03/29/2023) 3 tablet 0 Not Taking   methylPREDNISolone (MEDROL DOSEPAK) 4 MG TBPK tablet Take 4 mg by mouth as directed.   03/26/2023   Scheduled:  amitriptyline   25-50 mg Oral QHS   buPROPion   300 mg Oral Daily   DULoxetine   60 mg Oral Daily   gabapentin   900 mg Oral Daily   insulin  aspart  0-15 Units Subcutaneous TID WC   insulin  aspart  0-5 Units Subcutaneous QHS   irbesartan   37.5 mg Oral Daily   levothyroxine   100 mcg Oral Q0600   pantoprazole   40 mg Oral Daily   rOPINIRole   3 mg Oral Daily   simvastatin   40 mg Oral q1800   Continuous:   ceFAZolin  (ANCEF ) IV 2 g  (03/31/23 0605)   PRN:acetaminophen  **OR** acetaminophen , albuterol , ALPRAZolam , bisacodyl , hydrALAZINE , morphine  injection, oxyCODONE , polyethylene glycol Anti-infectives (From admission, onward)    Start     Dose/Rate Route Frequency Ordered Stop   03/30/23 1000  cefTRIAXone  (ROCEPHIN ) 2 g in sodium chloride  0.9 % 100 mL IVPB  Status:  Discontinued        2 g 200 mL/hr over 30 Minutes Intravenous Every 24 hours 03/29/23 1950 03/30/23 0746   03/30/23 1000  ceFAZolin  (ANCEF ) IVPB  2g/100 mL premix        2 g 200 mL/hr over 30 Minutes Intravenous Every 8 hours 03/30/23 0834     03/30/23 0600  vancomycin  (VANCOCIN ) IVPB 1000 mg/200 mL premix  Status:  Discontinued        1,000 mg 200 mL/hr over 60 Minutes Intravenous Every 12 hours 03/29/23 2021 03/30/23 0746   03/29/23 1715  vancomycin  (VANCOCIN ) IVPB 1000 mg/200 mL premix        1,000 mg 200 mL/hr over 60 Minutes Intravenous  Once 03/29/23 1708 03/29/23 1854   03/29/23 1715  ceFEPIme  (MAXIPIME ) 2 g in sodium chloride  0.9 % 100 mL IVPB        2 g 200 mL/hr over 30 Minutes Intravenous  Once 03/29/23 1708 03/29/23 1829        Results for orders placed or performed during the hospital encounter of 03/29/23 (from the past 48 hours)  Comprehensive metabolic panel     Status: Abnormal   Collection Time: 03/29/23  5:29 PM  Result Value Ref Range   Sodium 131 (L) 135 - 145 mmol/L   Potassium 2.7 (LL) 3.5 - 5.1 mmol/L    Comment: CRITICAL RESULT CALLED TO, READ BACK BY AND VERIFIED WITH EDWARDS,C ON 03/29/23 AT 1820 BY LOY,C   Chloride 97 (L) 98 - 111 mmol/L   CO2 22 22 - 32 mmol/L   Glucose, Bld 193 (H) 70 - 99 mg/dL    Comment: Glucose reference range applies only to samples taken after fasting for at least 8 hours.   BUN 12 6 - 20 mg/dL   Creatinine, Ser 9.42 0.44 - 1.00 mg/dL   Calcium 8.0 (L) 8.9 - 10.3 mg/dL   Total Protein 6.9 6.5 - 8.1 g/dL   Albumin 3.3 (L) 3.5 - 5.0 g/dL   AST 42 (H) 15 - 41 U/L   ALT 50 (H) 0 - 44 U/L    Alkaline Phosphatase 60 38 - 126 U/L   Total Bilirubin 1.3 (H) 0.0 - 1.2 mg/dL   GFR, Estimated >39 >39 mL/min    Comment: (NOTE) Calculated using the CKD-EPI Creatinine Equation (2021)    Anion gap 12 5 - 15    Comment: Performed at [facility], [address], [place], [place] [address]  CBC with Differential     Status: Abnormal   Collection Time: 03/29/23  5:29 PM  Result Value Ref Range   WBC 8.9 4.0 - 10.5 K/uL   RBC 3.58 (L) 3.87 - 5.11 MIL/uL   Hemoglobin 10.5 (L) 12.0 - 15.0 g/dL   HCT 68.3 (L) 63.9 - 53.9 %   MCV 88.3 80.0 - 100.0 fL   MCH 29.3 26.0 - 34.0 pg   MCHC 33.2 30.0 - 36.0 g/dL   RDW 86.9 88.4 - 84.4 %   Platelets 131 (L) 150 - 400 K/uL   nRBC 0.0 0.0 - 0.2 %   Neutrophils Relative % 70 %   Neutro Abs 6.2 1.7 - 7.7 K/uL   Lymphocytes Relative 17 %   Lymphs Abs 1.5 0.7 - 4.0 K/uL   Monocytes Relative 12 %   Monocytes Absolute 1.1 (H) 0.1 - 1.0 K/uL   Eosinophils Relative 0 %   Eosinophils Absolute 0.0 0.0 - 0.5 K/uL   Basophils Relative 0 %   Basophils Absolute 0.0 0.0 - 0.1 K/uL   Immature Granulocytes 1 %   Abs Immature Granulocytes 0.05 0.00 - 0.07 K/uL    Comment: Performed at [facility],  [address] [place], [place] [address]  Sedimentation rate     Status: Abnormal   Collection Time: 03/29/23  5:29 PM  Result Value Ref Range   Sed Rate 55 (H) 0 - 22 mm/hr    Comment: Performed at [facility], [address], [place], [place] [address]  C-reactive protein     Status: Abnormal   Collection Time: 03/29/23  5:29 PM  Result Value Ref Range   CRP 21.8 (H) <1.0 mg/dL    Comment: Performed at [facility], [address], [place], [place] [address]  Lactic acid, plasma     Status: None   Collection Time: 03/29/23  5:29 PM  Result Value Ref Range   Lactic Acid, Venous 1.4 0.5 - 1.9 mmol/L    Comment: Performed at [facility], [address] [place], [place] [address]  Culture, blood (routine x 2)     Status: None (Preliminary result)    Collection Time: 03/29/23  5:29 PM   Specimen: BLOOD  Result Value Ref Range   Specimen Description BLOOD RFOA    Special Requests      BOTTLES DRAWN AEROBIC AND ANAEROBIC Blood Culture results may not be optimal due to an inadequate volume of blood received in culture bottles   Culture  Setup Time      GRAM POSITIVE COCCI ANAEROBIC BOTTLE ONLY Gram Stain Report Called to,Read Back By and Verified With: DOROTHA NED @[facility]  [phone], VIRAY,J Performed at [facility], [address] [place], [place] [address]    Culture GRAM POSITIVE COCCI    Report Status PENDING   Hemoglobin A1c     Status: Abnormal   Collection Time: 03/29/23  5:29 PM  Result Value Ref Range   Hgb A1c MFr Bld 7.7 (H) 4.8 - 5.6 %    Comment: (NOTE)         Prediabetes: 5.7 - 6.4         Diabetes: >6.4         Glycemic control for adults with diabetes: <7.0    Mean Plasma Glucose 174 mg/dL    Comment: (NOTE) Performed At: [facility] [address] [place], [place] [id number] Jennette Shorter MD Ey:[id number]   HIV Antibody (routine testing w rflx)     Status: None   Collection Time: 03/29/23  5:29 PM  Result Value Ref Range   HIV Screen 4th Generation wRfx Non Reactive Non Reactive    Comment: Performed at [facility], [address], [place], [place] [address]  Procalcitonin     Status: None   Collection Time: 03/29/23  5:29 PM  Result Value Ref Range   Procalcitonin 1.44 ng/mL    Comment:        Interpretation: PCT > 0.5 ng/mL and <= 2 ng/mL: Systemic infection (sepsis) is possible, but other conditions are known to elevate PCT as well. (NOTE)       Sepsis PCT Algorithm           Lower Respiratory Tract                                      Infection PCT Algorithm    ----------------------------     ----------------------------         PCT < 0.25 ng/mL                PCT < 0.10 ng/mL  Strongly encourage             Strongly discourage   discontinuation of antibiotics     initiation of antibiotics    ----------------------------     -----------------------------       PCT 0.25 - 0.50 ng/mL            PCT 0.10 - 0.25 ng/mL               OR       >80% decrease in PCT            Discourage initiation of                                            antibiotics      Encourage discontinuation           of antibiotics    ----------------------------     -----------------------------         PCT >= 0.50 ng/mL              PCT 0.26 - 0.50 ng/mL                AND       <80% decrease in PCT             Encourage initiation of                                             antibiotics       Encourage continuation           of antibiotics    ----------------------------     -----------------------------        PCT >= 0.50 ng/mL                  PCT > 0.50 ng/mL               AND         increase in PCT                  Strongly encourage                                      initiation of antibiotics    Strongly encourage escalation           of antibiotics                                     -----------------------------                                           PCT <= 0.25 ng/mL                                                 OR                                        >  80% decrease in PCT                                      Discontinue / Do not initiate                                             antibiotics  Performed at [facility], [address] [place], [place] [address]   Magnesium      Status: Abnormal   Collection Time: 03/29/23  5:29 PM  Result Value Ref Range   Magnesium  1.6 (L) 1.7 - 2.4 mg/dL    Comment: Performed at [facility], [address], [place], [place] [address]  Culture, blood (routine x 2)     Status: None (Preliminary result)   Collection Time: 03/29/23  5:35 PM   Specimen: BLOOD LEFT FOREARM  Result Value Ref Range   Specimen Description BLOOD LEFT FOREARM    Special Requests      BOTTLES DRAWN AEROBIC ONLY Blood Culture results  may not be optimal due to an inadequate volume of blood received in culture bottles   Culture      NO GROWTH 2 DAYS Performed at [facility], [address], [place], [place] [address]    Report Status PENDING   CBG monitoring, ED     Status: Abnormal   Collection Time: 03/29/23  9:13 PM  Result Value Ref Range   Glucose-Capillary 167 (H) 70 - 99 mg/dL    Comment: Glucose reference range applies only to samples taken after fasting for at least 8 hours.  Basic metabolic panel     Status: Abnormal   Collection Time: 03/30/23  5:08 AM  Result Value Ref Range   Sodium 136 135 - 145 mmol/L   Potassium 3.3 (L) 3.5 - 5.1 mmol/L   Chloride 104 98 - 111 mmol/L   CO2 23 22 - 32 mmol/L   Glucose, Bld 156 (H) 70 - 99 mg/dL    Comment: Glucose reference range applies only to samples taken after fasting for at least 8 hours.   BUN 11 6 - 20 mg/dL   Creatinine, Ser 9.45 0.44 - 1.00 mg/dL   Calcium 8.1 (L) 8.9 - 10.3 mg/dL   GFR, Estimated >39 >39 mL/min    Comment: (NOTE) Calculated using the CKD-EPI Creatinine Equation (2021)    Anion gap 9 5 - 15    Comment: Performed at [facility], [address], [place], [place] [address]  CBC     Status: Abnormal   Collection Time: 03/30/23  5:08 AM  Result Value Ref Range   WBC 8.3 4.0 - 10.5 K/uL   RBC 3.53 (L) 3.87 - 5.11 MIL/uL   Hemoglobin 10.3 (L) 12.0 - 15.0 g/dL   HCT 67.6 (L) 63.9 - 53.9 %   MCV 91.5 80.0 - 100.0 fL   MCH 29.2 26.0 - 34.0 pg   MCHC 31.9 30.0 - 36.0 g/dL   RDW 86.7 88.4 - 84.4 %   Platelets 118 (L) 150 - 400 K/uL   nRBC 0.0 0.0 - 0.2 %    Comment: Performed at [facility], [address], [place], [place] [address]  Strep pneumoniae urinary antigen     Status: None   Collection Time: 03/30/23  7:30 AM  Result Value Ref  Range   Strep Pneumo Urinary Antigen NEGATIVE NEGATIVE    Comment:        Infection due to S. pneumoniae cannot be absolutely ruled out since the antigen present may be below the detection limit of  the test. Performed at [facility], [address] [place], [place] [address]   CBG monitoring, ED     Status: Abnormal   Collection Time: 03/30/23  7:40 AM  Result Value Ref Range   Glucose-Capillary 155 (H) 70 - 99 mg/dL    Comment: Glucose reference range applies only to samples taken after fasting for at least 8 hours.  CBG monitoring, ED     Status: Abnormal   Collection Time: 03/30/23  1:09 PM  Result Value Ref Range   Glucose-Capillary 128 (H) 70 - 99 mg/dL    Comment: Glucose reference range applies only to samples taken after fasting for at least 8 hours.   Comment 1 Notify RN    Comment 2 Document in Chart   Glucose, capillary     Status: Abnormal   Collection Time: 03/30/23  4:41 PM  Result Value Ref Range   Glucose-Capillary 133 (H) 70 - 99 mg/dL    Comment: Glucose reference range applies only to samples taken after fasting for at least 8 hours.  Glucose, capillary     Status: Abnormal   Collection Time: 03/30/23  8:54 PM  Result Value Ref Range   Glucose-Capillary 133 (H) 70 - 99 mg/dL    Comment: Glucose reference range applies only to samples taken after fasting for at least 8 hours.  Basic metabolic panel     Status: Abnormal   Collection Time: 03/31/23  6:57 AM  Result Value Ref Range   Sodium 131 (L) 135 - 145 mmol/L   Potassium 3.5 3.5 - 5.1 mmol/L   Chloride 95 (L) 98 - 111 mmol/L   CO2 22 22 - 32 mmol/L   Glucose, Bld 283 (H) 70 - 99 mg/dL    Comment: Glucose reference range applies only to samples taken after fasting for at least 8 hours.   BUN 9 6 - 20 mg/dL   Creatinine, Ser 9.15 0.44 - 1.00 mg/dL   Calcium 8.0 (L) 8.9 - 10.3 mg/dL   GFR, Estimated >39 >39 mL/min    Comment: (NOTE) Calculated using the CKD-EPI Creatinine Equation (2021)    Anion gap 14 5 - 15    Comment: Performed at [facility], [address] [place], [place] [address]  Magnesium      Status: Abnormal   Collection Time: 03/31/23  6:57 AM  Result Value Ref Range    Magnesium  1.6 (L) 1.7 - 2.4 mg/dL    Comment: Performed at [facility], [address] [place], [place] [address]  Glucose, capillary     Status: Abnormal   Collection Time: 03/31/23  7:34 AM  Result Value Ref Range   Glucose-Capillary 140 (H) 70 - 99 mg/dL    Comment: Glucose reference range applies only to samples taken after fasting for at least 8 hours.    ECHOCARDIOGRAM COMPLETE Result Date: 03/30/2023    ECHOCARDIOGRAM REPORT   Patient Name:   Debbie Goodwin Date of Exam: 03/30/2023 Medical Rec #:  [id number]      Height:       63.0 in Accession #:    [id number]     Weight:       276.0 lb Date of Birth:  06/02/1970  BSA:          2.217 m Patient Age:    52 years       BP:           119/64 mmHg Patient Gender: F              HR:           81 bpm. Exam Location:  [facility] Procedure: 2D Echo, Cardiac Doppler and Color Doppler Indications:    Bacteremia R78.81  History:        Patient has no prior history of Echocardiogram examinations.                 Risk Factors:Hypertension, Dyslipidemia and Sleep Apnea.  Sonographer:    Aida Pizza RCS Referring Phys: [id number] [facility] IMPRESSIONS  1. Left ventricular ejection fraction, by estimation, is 60 to 65%. The left ventricle has normal function. The left ventricle has no regional wall motion abnormalities. Left ventricular diastolic parameters are indeterminate.  2. Right ventricular systolic function is normal. The right ventricular size is normal.  3. The mitral valve is abnormal. Mild mitral valve regurgitation. No evidence of mitral stenosis.  4. The aortic valve was not well visualized. Aortic valve regurgitation is not visualized. No aortic stenosis is present.  5. The inferior vena cava is normal in size with greater than 50% respiratory variability, suggesting right atrial pressure of 3 mmHg. FINDINGS  Left Ventricle: Left ventricular ejection fraction, by estimation, is 60 to 65%. The left ventricle has normal function. The left  ventricle has no regional wall motion abnormalities. The left ventricular internal cavity size was normal in size. There is  no left ventricular hypertrophy. Left ventricular diastolic parameters are indeterminate. Right Ventricle: The right ventricular size is normal. Right vetricular wall thickness was not well visualized. Right ventricular systolic function is normal. Left Atrium: Left atrial size was normal in size. Right Atrium: Right atrial size was normal in size. Pericardium: There is no evidence of pericardial effusion. Mitral Valve: The mitral valve is abnormal. Mild mitral valve regurgitation. No evidence of mitral valve stenosis. Tricuspid Valve: The tricuspid valve is normal in structure. Tricuspid valve regurgitation is not demonstrated. No evidence of tricuspid stenosis. Aortic Valve: The aortic valve was not well visualized. Aortic valve regurgitation is not visualized. No aortic stenosis is present. Aortic valve mean gradient measures 5.1 mmHg. Aortic valve peak gradient measures 9.4 mmHg. Aortic valve area, by VTI measures 2.35 cm. Pulmonic Valve: The pulmonic valve was not well visualized. Pulmonic valve regurgitation is not visualized. No evidence of pulmonic stenosis. Aorta: The aortic root is normal in size and structure. Venous: The inferior vena cava is normal in size with greater than 50% respiratory variability, suggesting right atrial pressure of 3 mmHg. IAS/Shunts: No atrial level shunt detected by color flow Doppler.  LEFT VENTRICLE PLAX 2D LVIDd:         5.20 cm   Diastology LVIDs:         3.20 cm   LV e' medial:    8.59 cm/s LV PW:         0.90 cm   LV E/e' medial:  16.4 LV IVS:        0.90 cm   LV e' lateral:   7.51 cm/s LVOT diam:     2.00 cm   LV E/e' lateral: 18.8 LV SV:         77 LV SV Index:   35 LVOT Area:  3.14 cm  RIGHT VENTRICLE RV S prime:     16.85 cm/s TAPSE (M-mode): 2.5 cm LEFT ATRIUM             Index        RIGHT ATRIUM           Index LA diam:        4.20 cm  1.89 cm/m   RA Area:     14.90 cm LA Vol (A2C):   65.1 ml 29.36 ml/m  RA Volume:   36.80 ml  16.60 ml/m LA Vol (A4C):   64.3 ml 29.00 ml/m LA Biplane Vol: 65.0 ml 29.31 ml/m  AORTIC VALVE AV Area (Vmax):    2.48 cm AV Area (Vmean):   2.39 cm AV Area (VTI):     2.35 cm AV Vmax:           153.11 cm/s AV Vmean:          107.476 cm/s AV VTI:            0.329 m AV Peak Grad:      9.4 mmHg AV Mean Grad:      5.1 mmHg LVOT Vmax:         121.00 cm/s LVOT Vmean:        81.800 cm/s LVOT VTI:          0.246 m LVOT/AV VTI ratio: 0.75  AORTA Ao Root diam: 3.20 cm MITRAL VALVE MV Area (PHT): 5.13 cm     SHUNTS MV Decel Time: 148 msec     Systemic VTI:  0.25 m MV E velocity: 141.00 cm/s  Systemic Diam: 2.00 cm MV A velocity: 120.00 cm/s MV E/A ratio:  1.18 Dorn Ross MD Electronically signed by Dorn Ross MD Signature Date/Time: 03/30/2023/3:10:55 PM    Final    MR THORACIC SPINE W WO CONTRAST Result Date: 03/30/2023 CLINICAL DATA:  Mid back pain with infection suspected EXAM: MRI THORACIC WITHOUT AND WITH CONTRAST TECHNIQUE: Multiplanar and multiecho pulse sequences of the thoracic spine were obtained without and with intravenous contrast. CONTRAST:  10mL GADAVIST  GADOBUTROL  1 MMOL/ML IV SOLN COMPARISON:  Lumbar MRI from earlier today. FINDINGS: Alignment:  Normal Vertebrae: Marrow edema at the right T11-12 facet is better seen on prior lumbar MRI. There is regional edema in the back musculature with posterior epidural collection at T11-12 measuring 18 x 7 mm with posterior cord contact and crowding. No discitis or focal bone lesion. A small collection is also seen posterior to the right facet of T11-12, measuring 8 mm on axial postcontrast images. Cord: Crowding from behind at the level of epidural abscess. No cord edema. Paraspinal and other soft tissues: As above Disc levels: Mild spondylitic and facet spurring. No bony impingement in the thoracic spine. IMPRESSION: Confirmed posterior epidural collection  at T11-12 with mass effect on the posterior cord, consistent with abscess in this setting and measuring 18 x 7 mm. 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 Electronically Signed   By: Dorn Roulette M.D.   On: 03/30/2023 08:59   MR LUMBAR SPINE WO CONTRAST Result Date: 03/30/2023 CLINICAL DATA:  Lumbar radiculopathy with infection suspected. Bacteremia, intrathecal lumbar steroid injection with lower back pain. EXAM: MRI LUMBAR SPINE WITHOUT CONTRAST TECHNIQUE: Multiplanar, multisequence MR imaging of the lumbar spine was performed. No intravenous contrast was administered. COMPARISON:  02/23/2021 FINDINGS: Segmentation:  Standard. Alignment:  Physiologic. Vertebrae: Marrow edema at the  right T11-12 facet. There is a posterior epidural collection at T11-12 only seen on sagittal imaging, 18 x 8 mm on sagittal STIR images, suspicious for abscess in this clinical setting and with the adjacent facet arthritis that may be septic. Conus medullaris and cauda equina: Conus extends to the L1-2 level. Conus and cauda equina appear normal. Paraspinal and other soft tissues: Soft tissue edema around the inflamed right T11-12 facet. Disc levels: T12- L1: Unremarkable. L1-L2: Minor disc bulging L2-L3: Disc height loss and bulging with small central protrusion. Negative facets L3-L4: Disc narrowing and bulging with right paracentral upward pointing herniation. Degenerative facet spurring ligamentum flavum thickening. Moderate spinal stenosis. L4-L5: Disc narrowing and bulging with left foraminal protrusion. Asymmetric left facet spurring. Moderate left foraminal stenosis. L5-S1:Disc narrowing and bulging with left paracentral protrusion impinging on the left S1 nerve root. Mild left foraminal stenosis. Prelim sent in epic chat. IMPRESSION: 1. Posterior epidural collection at T11-12, suspicious for epidural abscess in this clinical setting  and with adjacent right T11-12 facet arthritis. Recommend thoracic MRI with and without contrast. 2. Lumbar spine degeneration as described. Moderate spinal stenosis at L3-4. Electronically Signed   By: Dorn Roulette M.D.   On: 03/30/2023 07:16   DG Chest Portable 1 View Result Date: 03/29/2023 CLINICAL DATA:  Known positive blood cultures EXAM: PORTABLE CHEST 1 VIEW COMPARISON:  CT from earlier in the same day. FINDINGS: Cardiac shadow is within normal limits. Mild vascular congestion is again seen. No focal confluent infiltrate is noted. No bony abnormality is seen. Surgical changes are noted in the cervical spine. IMPRESSION: Stable vascular congestion.  No focal infiltrate is seen. Electronically Signed   By: Oneil Devonshire M.D.   On: 03/29/2023 18:40    ROS: As above Blood pressure 125/71, pulse 90, temperature 99.8 F (37.7 C), temperature source Oral, resp. rate 18, height 5' 3 (1.6 m), weight 125.2 kg, last menstrual period 07/26/2020, SpO2 98%. Estimated body mass index is 48.89 kg/m as calculated from the following:   Height as of this encounter: 5' 3 (1.6 m).   Weight as of this encounter: 125.2 kg.  Physical Exam  General: An alert pleasant obese 52 year old white female in no apparent distress.  HEENT: Normocephalic, atraumatic, extraocular muscles intact  Neck: Her anterior posterior cervical incisions are well-healed.  She has limited cervical range of motion.  Thorax: Symmetric  Abdomen: Soft  Extremities: Unremarkable  Neurologic exam: The patient is alert and oriented x 3.  Cranial nerves II through XII are grossly intact bilaterally.  The patient's motor strength is 5/5 in her bilateral bicep, tricep, handgrip, quadricep, gastrocnemius and dorsiflexors.  Sensory functions intact to light touch sensation all tested dermatomes bilaterally in her upper lower extremities.  Cerebellar function is intact to rapid alternating movements of the upper extremities  bilaterally.  I reviewed the patient's cervical and lumbar MRIs.  She has had anterior and posterior cervical fusions.  I do not see any significant neural compression or signs of infection in her cervical or lumbar spine.  I reviewed the patient's thoracic MRI.  She has a small focal epidural abscess at T11-12 right greater left with some mild mass effect.    Assessment/Plan: Spontaneous thoracic epidural abscess: I have discussed the situation with the patient and Dr. Royal.  At this point she has no indication of neurologic compromise and therefore I recommend continue treatment with antibiotics per ID.  If she were develop lower extremity weakness, numbness, etc. we might consider surgery.  It sounds like we have an organism so I do not think we need to aspirate the thoracic abscess unless ID suspects this thoracic lesion is a different organism.  Debbie Goodwin 03/31/2023, 8:06 AM

## 2023-04-01 ENCOUNTER — Telehealth (HOSPITAL_BASED_OUTPATIENT_CLINIC_OR_DEPARTMENT_OTHER): Payer: Self-pay | Admitting: *Deleted

## 2023-04-01 DIAGNOSIS — G061 Intraspinal abscess and granuloma: Secondary | ICD-10-CM | POA: Diagnosis not present

## 2023-04-01 LAB — COMPREHENSIVE METABOLIC PANEL
ALT: 37 U/L (ref 0–44)
AST: 36 U/L (ref 15–41)
Albumin: 2.9 g/dL — ABNORMAL LOW (ref 3.5–5.0)
Alkaline Phosphatase: 94 U/L (ref 38–126)
Anion gap: 14 (ref 5–15)
BUN: 8 mg/dL (ref 6–20)
CO2: 23 mmol/L (ref 22–32)
Calcium: 8.4 mg/dL — ABNORMAL LOW (ref 8.9–10.3)
Chloride: 95 mmol/L — ABNORMAL LOW (ref 98–111)
Creatinine, Ser: 0.71 mg/dL (ref 0.44–1.00)
GFR, Estimated: >60 mL/min (ref >60)
Glucose, Bld: 144 mg/dL — ABNORMAL HIGH (ref 70–99)
Potassium: 3.5 mmol/L (ref 3.5–5.1)
Sodium: 132 mmol/L — ABNORMAL LOW (ref 135–145)
Total Bilirubin: 1.1 mg/dL (ref 0.0–1.2)
Total Protein: 6.9 g/dL (ref 6.5–8.1)

## 2023-04-01 LAB — CBC WITH DIFFERENTIAL/PLATELET
Abs Immature Granulocytes: 0.15 10*3/uL — ABNORMAL HIGH (ref 0.00–0.07)
Basophils Absolute: 0.1 10*3/uL (ref 0.0–0.1)
Basophils Relative: 1 %
Eosinophils Absolute: 0 10*3/uL (ref 0.0–0.5)
Eosinophils Relative: 0 %
HCT: 30.9 % — ABNORMAL LOW (ref 36.0–46.0)
Hemoglobin: 10.1 g/dL — ABNORMAL LOW (ref 12.0–15.0)
Immature Granulocytes: 1 %
Lymphocytes Relative: 24 %
Lymphs Abs: 3 10*3/uL (ref 0.7–4.0)
MCH: 29 pg (ref 26.0–34.0)
MCHC: 32.7 g/dL (ref 30.0–36.0)
MCV: 88.8 fL (ref 80.0–100.0)
Monocytes Absolute: 1.2 10*3/uL — ABNORMAL HIGH (ref 0.1–1.0)
Monocytes Relative: 10 %
Neutro Abs: 8 10*3/uL — ABNORMAL HIGH (ref 1.7–7.7)
Neutrophils Relative %: 64 %
Platelets: 212 10*3/uL (ref 150–400)
RBC: 3.48 MIL/uL — ABNORMAL LOW (ref 3.87–5.11)
RDW: 13.9 % (ref 11.5–15.5)
WBC: 12.5 10*3/uL — ABNORMAL HIGH (ref 4.0–10.5)
nRBC: 0 % (ref 0.0–0.2)

## 2023-04-01 LAB — GLUCOSE, CAPILLARY
Glucose-Capillary: 150 mg/dL — ABNORMAL HIGH (ref 70–99)
Glucose-Capillary: 126 mg/dL — ABNORMAL HIGH (ref 70–99)
Glucose-Capillary: 99 mg/dL (ref 70–99)
Glucose-Capillary: 123 mg/dL — ABNORMAL HIGH (ref 70–99)

## 2023-04-01 LAB — LEGIONELLA PNEUMOPHILA SEROGP 1 UR AG: L. pneumophila Serogp 1 Ur Ag: NEGATIVE

## 2023-04-01 LAB — HEPATITIS B SURFACE ANTIBODY, QUANTITATIVE: Hep B S AB Quant (Post): 62.4 m[IU]/mL

## 2023-04-01 MED ORDER — FLUCONAZOLE 150 MG PO TABS
150.0000 mg | ORAL_TABLET | Freq: Once | ORAL | Status: AC
  Administered 2023-04-01: 150 mg via ORAL
  Filled 2023-04-01: qty 1

## 2023-04-01 NOTE — Plan of Care (Signed)
  Problem: Coping: Goal: Ability to adjust to condition or change in health will improve Outcome: Progressing   Problem: Fluid Volume: Goal: Ability to maintain a balanced intake and output will improve Outcome: Progressing   Problem: Health Behavior/Discharge Planning: Goal: Ability to identify and utilize available resources and services will improve Outcome: Progressing Goal: Ability to manage health-related needs will improve Outcome: Progressing   Problem: Metabolic: Goal: Ability to maintain appropriate glucose levels will improve Outcome: Progressing   Problem: Nutritional: Goal: Maintenance of adequate nutrition will improve Outcome: Progressing   Problem: Skin Integrity: Goal: Risk for impaired skin integrity will decrease Outcome: Progressing   Problem: Tissue Perfusion: Goal: Adequacy of tissue perfusion will improve Outcome: Progressing   Problem: Education: Goal: Knowledge of General Education information will improve Description: Including pain rating scale, medication(s)/side effects and non-pharmacologic comfort measures Outcome: Progressing   Problem: Health Behavior/Discharge Planning: Goal: Ability to manage health-related needs will improve Outcome: Progressing   Problem: Clinical Measurements: Goal: Ability to maintain clinical measurements within normal limits will improve Outcome: Progressing Goal: Will remain free from infection Outcome: Progressing Goal: Diagnostic test results will improve Outcome: Progressing   Problem: Activity: Goal: Risk for activity intolerance will decrease Outcome: Progressing   Problem: Nutrition: Goal: Adequate nutrition will be maintained Outcome: Progressing   Problem: Coping: Goal: Level of anxiety will decrease Outcome: Progressing   Problem: Elimination: Goal: Will not experience complications related to bowel motility Outcome: Progressing   Problem: Pain Management: Goal: General experience of  comfort will improve Outcome: Progressing   Problem: Safety: Goal: Ability to remain free from injury will improve Outcome: Progressing   Problem: Skin Integrity: Goal: Risk for impaired skin integrity will decrease Outcome: Progressing

## 2023-04-01 NOTE — Progress Notes (Signed)
 HOSPITALIST                ROUNDING                 NOTE Alessia Gonsalez FMW:[id number]  DOB: 01/30/1971  DOA: 03/29/2023  PCP: Bertell Satterfield, MD  04/01/2023,12:53 PM   LOS: 3 days      Code Status: Full code presumed From: Home  current Dispo: Likely home     53 year old white female Known colonic diverticuli followed by Dr. 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 Darlis affiliated with [place] [facility]  Patient came to [facility] ED after having 2 months of increasing weakness with ambulation difficulty getting around and instability on her hips and legs She had been finding it more difficult to move around and had pain radiating from around her thoracic spine around to her abdomen She presented with accidental fall with new  fever-- chest pain back pain 03/28/23 Tmax 102.6-COVID flu were negative CT head negative CT cervical spine prevertebral soft tissue swelling urine analysis unremarkable Toradol  given with improvement-MRI was done and showed fluid collection skull to T4 She was sent home with azithromycin  and amoxicillin  but called back because blood cultures grew MSSA in 2 bottles  workup CT = trace pleural effusions dependent opacities atelectasis versus pneumonia-hepatomegaly with steatosis?  Cirrhosis?  Small bowel thickening edema cervical prevertebral soft tissue fluid edema in the upper thorax MRI showed prevertebral effusion from skull base to T4 --13 mm at C3-C4-C7 ACDF without residual spinal canal neuroforaminal stenosis L-spine MRI posterior epidural collection T11-T12 suspicious for epidural abscess MRI thoracic spine posterior epidural  collection T11-12 with mass effect on posterior cord 18 x 7 cm  WBC 8.9 lactic acid 1.5 platelet 131  potassium 2.7 ESR 55 CRP 21 UA unremarkable for UTI  Infectious diseases telemetry consulted-patient sent to [facility] for TTE further workup 1/2 echocardiogram indeterminate diastolic parameters aortic and mitral valve no gross vegetation 1/3 neurosurgery consulted 1/3 ENT consulted    Plan  [facility] X212/31 MSSA infection, 1/1 blood culture still has staph-T11-T12 epidural abscess mass effect 18X 7 mm--- skull base to T4 fluid 13 mmc ollection in setting ACDF Neurosurgery Dr. Mavis has seen patient - positive cultures should treat through and watch for neurovascular compromise which would be the only indication for thoracic surgery--- Per ENT Dr. Roark no ENT surgical management of this issue Continue cefazolin  monotherapy--- will need 6 weeks total therapy ID following-TTE ordered-?  Need TEE deferring to ID as may not change overall plan  Chronic pain secondary to back pain Continues on Wellbutrin  xl 300 gabapentin  900 daily, Cymbalta  60, amitriptyline  variable doses Continue oxycodone  10 every 6 as needed pain, Flexeril  10 3 times daily as needed spasm Continue morphine  2 mg every 2 as needed severe pain  HTN Continues on avapro  37.5 hydralazine  5 every 4 as needed  Blood pressure moderately controlled  Depression Continues on Xanax  0.25 3 times daily as neede  Class IV obesity BMI >48 DM TY 2 Resume metformin  1000 twice daily---hold phentermine-continues on sliding scale coverage moderate with at bedtime--- blood sugar now better controlled Resume nighttime CPAP as per orders  Reflux GERD as well as prior hiatal hernia and Schatzki ring  Continue Pepcid  20 daily, Protonix  40 daily  DVT prophylaxis: SCD  Status is: Inpatient Remains inpatient appropriate because:   Requires further workup   Subjective:  Looks fair has some radiating pain from the right flank to the  right abdominal area which is a little worse Has no neck pain no restriction of movement in the upper extremities Overall feels okay  Objective + exam Vitals:   03/31/23 2013 03/31/23 2206 04/01/23 0346 04/01/23 0846  BP: (!) 144/62 121/68 (!) 147/68 (!) 107/43  Pulse: 97 90 92 92  Resp: 18 18 18    Temp: (!) 102.4 F (39.1 C) 98.5 F (36.9 C) 99.8 F (37.7 C) 99.8 F (37.7 C)  TempSrc: Oral Oral Oral Oral  SpO2: 96% 94% 94% 97%  Weight:      Height:       Filed Weights   03/29/23 1605  Weight: 125.2 kg    Examination:  EOMI NCAT no icterus no pallor Chest is clear ROM intact-very mild foot drop on left side Power 5/5 upper extremities moving neck equally no limitation S1-S2 no murmur   Data Reviewed: reviewed   CBC    Component Value Date/Time   WBC 12.5 (H) 04/01/2023 0811   RBC 3.48 (L) 04/01/2023 0811   HGB 10.1 (L) 04/01/2023 0811   HCT 30.9 (L) 04/01/2023 0811   PLT 212 04/01/2023 0811   MCV 88.8 04/01/2023 0811   MCH 29.0 04/01/2023 0811   MCHC 32.7 04/01/2023 0811   RDW 13.9 04/01/2023 0811   LYMPHSABS 3.0 04/01/2023 0811   MONOABS 1.2 (H) 04/01/2023 0811   EOSABS 0.0 04/01/2023 0811   BASOSABS 0.1 04/01/2023 0811      Latest Ref Rng & Units 04/01/2023    8:11 AM 03/31/2023    6:57 AM 03/30/2023    5:08 AM  CMP  Glucose 70 - 99 mg/dL 855  716  843   BUN 6 - 20 mg/dL 8  9  11    Creatinine 0.44 - 1.00 mg/dL 9.28  9.15  9.45   Sodium 135 - 145 mmol/L 132  131  136   Potassium 3.5 - 5.1 mmol/L 3.5  3.5  3.3   Chloride 98 - 111 mmol/L 95  95  104   CO2 22 - 32 mmol/L 23  22  23    Calcium 8.9 - 10.3 mg/dL 8.4  8.0  8.1   Total Protein 6.5 - 8.1 g/dL 6.9     Total Bilirubin 0.0 - 1.2 mg/dL 1.1     Alkaline Phos 38 - 126 U/L 94     AST 15 - 41 U/L 36     ALT 0 - 44 U/L 37       Scheduled Meds:  amitriptyline   25-50 mg Oral QHS   buPROPion   300 mg Oral Daily   DULoxetine   60 mg Oral Daily   famotidine   20 mg Oral QHS   gabapentin   900 mg Oral  Daily   insulin  aspart  0-15 Units Subcutaneous TID WC   insulin  aspart  0-5 Units Subcutaneous QHS   irbesartan   37.5 mg Oral Daily   levothyroxine   100 mcg Oral Q0600   metFORMIN   1,000 mg Oral BID WC   pantoprazole   40 mg Oral Daily   rOPINIRole   3 mg Oral Daily   simvastatin   40 mg Oral q1800   Continuous Infusions:   ceFAZolin  (ANCEF ) IV 2 g (04/01/23 0554)    Time 50  Colen Grimes, MD  [facility]

## 2023-04-01 NOTE — Telephone Encounter (Signed)
 Post ED Visit - Positive Culture Follow-up  Culture report reviewed by antimicrobial stewardship pharmacist: [facility] []  [place]in [place], Pharm.D. []  Venetia Gully, Pharm.D., BCPS AQ-ID []  Garrel Crews, Pharm.D., BCPS []  Almarie Lunger, [address].D., BCPS []  [place], [place].D., BCPS, AAHIVP []  Rosaline Bihari, Pharm.D., BCPS, AAHIVP []  Vernell Meier, PharmD, BCPS []  Latanya Hint, PharmD, BCPS []  Donald Medley, PharmD, BCPS []  Rocky Bold, PharmD []  Dorothyann Alert, PharmD, BCPS [x]  Leonor Eland, PharmD  [facility] []  Rosaline Edison, PharmD []  Romona Bliss, PharmD []  Dolphus Roller, PharmD []  Veva Seip, Rph []  Vernell Daunt) Leonce, PharmD []  Eva Allis, PharmD []  Rosaline Millet, PharmD []  Iantha Batch, PharmD []  Arvin Gauss, PharmD []  Wanda Hasting, PharmD []  Ronal Rav, PharmD []  Rocky Slade, PharmD []  Bard Jeans, PharmD   Positive blood culture Pt currently admitted. ID following and treating.  No further intervention Albino Alan Novak 04/01/2023, 8:40 AM

## 2023-04-02 DIAGNOSIS — G061 Intraspinal abscess and granuloma: Secondary | ICD-10-CM | POA: Diagnosis not present

## 2023-04-02 LAB — CULTURE, BLOOD (ROUTINE X 2)

## 2023-04-02 LAB — GLUCOSE, CAPILLARY
Glucose-Capillary: 122 mg/dL — ABNORMAL HIGH (ref 70–99)
Glucose-Capillary: 136 mg/dL — ABNORMAL HIGH (ref 70–99)
Glucose-Capillary: 137 mg/dL — ABNORMAL HIGH (ref 70–99)
Glucose-Capillary: 181 mg/dL — ABNORMAL HIGH (ref 70–99)

## 2023-04-02 MED ORDER — LIDOCAINE 5 % EX PTCH
1.0000 | MEDICATED_PATCH | CUTANEOUS | Status: DC
  Administered 2023-04-02 – 2023-04-06 (×5): 1 via TRANSDERMAL
  Filled 2023-04-02 (×5): qty 1

## 2023-04-02 MED ORDER — OXYCODONE HCL 5 MG PO TABS
15.0000 mg | ORAL_TABLET | Freq: Four times a day (QID) | ORAL | Status: DC | PRN
  Administered 2023-04-02 – 2023-04-04 (×7): 15 mg via ORAL
  Filled 2023-04-02 (×8): qty 3

## 2023-04-02 MED ORDER — NAPROXEN 250 MG PO TABS
500.0000 mg | ORAL_TABLET | Freq: Two times a day (BID) | ORAL | Status: DC
  Administered 2023-04-02 – 2023-04-06 (×8): 500 mg via ORAL
  Filled 2023-04-02 (×9): qty 2

## 2023-04-02 NOTE — Progress Notes (Signed)
 HOSPITALIST                ROUNDING                 NOTE Debbie Goodwin FMW:[id number]  DOB: 1970/09/30  DOA: 03/29/2023  PCP: Bertell Satterfield, MD  04/02/2023,10:49 AM   LOS: 4 days      Code Status: Full code presumed From: Home  current Dispo: Likely home     53 year old white female Known colonic diverticuli followed by Dr. 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 Darlis affiliated with [place] [facility]  Patient came to [facility] ED after having 2 months of increasing weakness with ambulation difficulty getting around and instability on her hips and legs She had been finding it more difficult to move around and had pain radiating from around her thoracic spine around to her abdomen She presented with accidental fall with new  fever-- chest pain back pain 03/28/23 Tmax 102.6-COVID flu were negative CT head negative CT cervical spine prevertebral soft tissue swelling urine analysis unremarkable Toradol  given with improvement-MRI was done and showed fluid collection skull to T4 She was sent home with azithromycin  and amoxicillin  but called back because blood cultures grew MSSA in 2 bottles  workup CT = trace pleural effusions dependent opacities atelectasis versus pneumonia-hepatomegaly with steatosis?  Cirrhosis?  Small bowel thickening edema cervical prevertebral soft tissue fluid edema in the upper thorax MRI showed prevertebral effusion from skull base to T4 --13 mm at C3-C4-C7 ACDF without residual spinal canal neuroforaminal stenosis L-spine MRI posterior epidural collection T11-T12 suspicious for epidural abscess MRI thoracic spine posterior epidural  collection T11-12 with mass effect on posterior cord 18 x 7 cm  WBC 8.9 lactic acid 1.5 platelet 131  potassium 2.7 ESR 55 CRP 21 UA unremarkable for UTI  Infectious diseases telemetry consulted-patient sent to [facility] for TTE further workup 1/2 echocardiogram indeterminate diastolic parameters aortic and mitral valve no gross vegetation 1/3 neurosurgery consulted 1/3 ENT consulted    Plan  MSSA back infection BC X2 on12/31, 1/1 blood culture still has staph-has 2 possible separate sitesT11-T12 epidural abscess mass effect 18X 7 mm--- skull base to T4 fluid 13 mmc ollection in setting ACDF NeuroS Dr. Mavis has seen patient - positive cultures should treat through and watch for neurovascular compromisee-only indication for thoracic surgery--- Per ENT Dr. Roark no ENT surgical management of this issue--- as has been having increasing pain and limited function will get MRI back in the next 48 hours and discuss implications of this with the neurosurgeon Continue cefazolin  monotherapy--- will need 6 weeks total therapy ID following-TTE performed-unclear if needs TEE or if this would change management  Acute pain from infection superimposed on chronic low back pain Home meds-- Wellbutrin  xl 300 gabapentin  900 daily, Cymbalta  60, amitriptyline  variable dose--oxycodone  10 every 6 as needed pain, Flexeril  10 3 times daily as needed spasm Will increase oxycodone  to 15 every 6, will add Naprosyn  550 twice daily and topical lidocaine  Can use IV morphine  2 mg every 2 as needed severe pain  HTN Continues on avapro  37.5 hydralazine  5 every 4 as needed  Blood pressure moderately controlled  Depression Continues on Xanax  0.25 3 times daily as neede  Class  IV obesity BMI >48 DM TY 2 Resume metformin  1000 twice daily---hold phentermine-continues on sliding scale coverage moderate with at bedtime--- CBG ranging 90s to 120s eating 100% of meals Resume nighttime CPAP as per orders  Reflux GERD as  well as prior hiatal hernia and Schatzki ring Continue Pepcid  20 daily, Protonix  40 daily  DVT prophylaxis: SCD  Status is: Inpatient Remains inpatient appropriate because:   Requires further workup   Subjective:  Pain seems to have migrated from the right to the left flank she is having pain on turning her torso and has not really been able to stand up I have mentioned to her that if she continues to have these deficits despite pain control we will probably need to reimage the area  Objective + exam Vitals:   04/01/23 0846 04/01/23 2049 04/02/23 0520 04/02/23 0833  BP: (!) 107/43 116/64 (!) 116/51 (!) 109/51  Pulse: 92 89 85 89  Resp:  18 18   Temp: 99.8 F (37.7 C) 98.1 F (36.7 C) 98.4 F (36.9 C) 98.2 F (36.8 C)  TempSrc: Oral Oral Oral Oral  SpO2: 97% 100% 98% 96%  Weight:      Height:       Filed Weights   03/29/23 1605  Weight: 125.2 kg    Examination:   Coherent pleasant no distress straight leg raise on the left side quite painful at 45 degrees can abduct the left thigh and foot without pain straight leg raise on the right is fine She is little hyperreflexic Chest is clear no wheeze Abdomen is rotund no rebound no guarding   Data Reviewed: reviewed   CBC    Component Value Date/Time   WBC 12.5 (H) 04/01/2023 0811   RBC 3.48 (L) 04/01/2023 0811   HGB 10.1 (L) 04/01/2023 0811   HCT 30.9 (L) 04/01/2023 0811   PLT 212 04/01/2023 0811   MCV 88.8 04/01/2023 0811   MCH 29.0 04/01/2023 0811   MCHC 32.7 04/01/2023 0811   RDW 13.9 04/01/2023 0811   LYMPHSABS 3.0 04/01/2023 0811   MONOABS 1.2 (H) 04/01/2023 0811   EOSABS 0.0 04/01/2023 0811   BASOSABS 0.1 04/01/2023 0811      Latest Ref Rng & Units 04/01/2023    8:11 AM 03/31/2023    6:57 AM 03/30/2023    5:08 AM  CMP  Glucose 70 - 99 mg/dL 855  716  843   BUN 6 - 20 mg/dL 8  9  11    Creatinine 0.44 - 1.00 mg/dL 9.28  9.15  9.45   Sodium 135 - 145 mmol/L 132  131  136   Potassium 3.5 - 5.1 mmol/L 3.5   3.5  3.3   Chloride 98 - 111 mmol/L 95  95  104   CO2 22 - 32 mmol/L 23  22  23    Calcium 8.9 - 10.3 mg/dL 8.4  8.0  8.1   Total Protein 6.5 - 8.1 g/dL 6.9     Total Bilirubin 0.0 - 1.2 mg/dL 1.1     Alkaline Phos 38 - 126 U/L 94     AST 15 - 41 U/L 36     ALT 0 - 44 U/L 37       Scheduled Meds:  amitriptyline   25-50 mg Oral QHS   buPROPion   300 mg Oral Daily   DULoxetine   60 mg Oral Daily   famotidine   20 mg Oral QHS   gabapentin   900 mg Oral Daily   insulin  aspart  0-15 Units Subcutaneous TID WC   insulin  aspart  0-5 Units Subcutaneous QHS   irbesartan   37.5 mg Oral Daily   levothyroxine   100 mcg Oral Q0600   lidocaine   1 patch Transdermal Q24H   metFORMIN   1,000 mg Oral BID WC   naproxen   500 mg Oral BID WC   pantoprazole   40 mg Oral Daily   rOPINIRole   3 mg Oral Daily   simvastatin   40 mg Oral q1800   Continuous Infusions:   ceFAZolin  (ANCEF ) IV 2 g (04/01/23 2054)    Time 20  Jai-Gurmukh Deontrey Massi, MD  [facility]

## 2023-04-02 NOTE — Evaluation (Signed)
 Physical Therapy Evaluation Patient Details Name: Debbie Goodwin MRN: [id number] DOB: 1970/12/23 Today's Date: 04/02/2023  History of Present Illness  53 y.o. female  presents to ED 03/29/2023 as she was called back for positive blood cultures. ED visit 12/31 secondary to complaints of a fever. MRI showed prevertebral effusion from skull base to T4 --13 mm at C3-C4-C7 ACDF without residual spinal canal neuroforaminal stenosis; L-spine MRI posterior epidural collection T11-T12 suspicious for epidural abscess; MRI thoracic spine posterior epidural collection T11-12 with mass effect on posterior cord 18 x 7 cm   ED discussed with neurosurgery on-call who indicated that fluid is noninfectious and does not appear to be contributing to her bacteremia  PMH obesity, hypertension, fibromyalgia, hyperlipidemia, diabetes mellitus, chronic cervical radiculopathy, status post decompression surgery by Dr. Mavis in 2022,  lumbar steroid injection every 77-month by Dr. Darlis, most recent in September 2024.  Clinical Impression   Pt admitted secondary to problem above with deficits below. PTA patient was independent prior to onset of weakness and falls.  Pt currently requires +2 mod assist to take steps along EOB. Moving well in the bed and getting to sitting. Anticipate slower progress due to pain and continued infection. If progress slower than anticipated, she may benefit from AIR. Currently recommending HHPT, but not yet ready/able to climb steps into home.  Anticipate patient will benefit from PT to address problems listed below.Will continue to follow acutely to maximize functional mobility independence and safety.           If plan is discharge home, recommend the following: Two people to help with walking and/or transfers;Assistance with cooking/housework;Assist for transportation;Help with stairs or ramp for entrance   Can travel by private vehicle        Equipment Recommendations BSC/3in1   Recommendations for Other Services  OT consult    Functional Status Assessment Patient has had a recent decline in their functional status and demonstrates the ability to make significant improvements in function in a reasonable and predictable amount of time.     Precautions / Restrictions Precautions Precautions: Fall Precaution Comments: reports 2 falls PTA      Mobility  Bed Mobility Overal bed mobility: Modified Independent             General bed mobility comments: supine to sit with rail and no assist    Transfers Overall transfer level: Needs assistance Equipment used: 2 person hand held assist Transfers: Sit to/from Stand Sit to Stand: +2 physical assistance, Min assist           General transfer comment: pt prefers husband assist with PT; boosting assist to come up and then steadying assist due to tendency to lean backwards (notes one of her falls was backwards)    Ambulation/Gait               General Gait Details: side steps toward HOB +2 mod assist due to imbalance  Stairs            Wheelchair Mobility     Tilt Bed    Modified Rankin (Stroke Patients Only)       Balance Overall balance assessment: Needs assistance Sitting-balance support: No upper extremity supported, Feet supported Sitting balance-Leahy Scale: Good     Standing balance support: Bilateral upper extremity supported Standing balance-Leahy Scale: Poor                               Pertinent  Vitals/Pain Pain Assessment Pain Assessment: 0-10 Pain Score: 2  Pain Location: lower torso from back around her sides toward the front Pain Descriptors / Indicators: Discomfort Pain Intervention(s): Limited activity within patient's tolerance, Monitored during session, Premedicated before session    Home Living Family/patient expects to be discharged to:: Private residence Living Arrangements: Spouse/significant other Available Help at Discharge:  Family;Available PRN/intermittently Type of Home: House Home Access: Stairs to enter Entrance Stairs-Rails: Right Entrance Stairs-Number of Steps: 2+1   Home Layout: Two level;Laundry or work area in basement;Able to live on main level with bedroom/bathroom (She does not go down into basement) Home Equipment: Agricultural Consultant (2 wheels) (RW was her mother's, but in good shape)      Prior Function Prior Level of Function : Independent/Modified Independent             Mobility Comments: no device       Extremity/Trunk Assessment   Upper Extremity Assessment Upper Extremity Assessment: Overall WFL for tasks assessed    Lower Extremity Assessment Lower Extremity Assessment: RLE deficits/detail RLE Deficits / Details: weak DF (4/5) RLE Sensation: WNL RLE Coordination: WNL    Cervical / Trunk Assessment Cervical / Trunk Assessment: Other exceptions Cervical / Trunk Exceptions: overweight  Communication   Communication Communication: No apparent difficulties Cueing Techniques: Verbal cues  Cognition Arousal: Alert Behavior During Therapy: WFL for tasks assessed/performed Overall Cognitive Status: Within Functional Limits for tasks assessed                                          General Comments      Exercises     Assessment/Plan    PT Assessment Patient needs continued PT services  PT Problem List Decreased strength;Decreased activity tolerance;Decreased balance;Decreased mobility;Decreased knowledge of use of DME;Decreased safety awareness;Obesity;Pain       PT Treatment Interventions DME instruction;Gait training;Stair training;Functional mobility training;Therapeutic activities;Therapeutic exercise;Balance training;Neuromuscular re-education;Patient/family education    PT Goals (Current goals can be found in the Care Plan section)  Acute Rehab PT Goals Patient Stated Goal: decr pain and return to independent PT Goal Formulation: With  patient Time For Goal Achievement: 04/16/23 Potential to Achieve Goals: Good    Frequency Min 1X/week     Co-evaluation               AM-PAC PT 6 Clicks Mobility  Outcome Measure Help needed turning from your back to your side while in a flat bed without using bedrails?: None Help needed moving from lying on your back to sitting on the side of a flat bed without using bedrails?: None Help needed moving to and from a bed to a chair (including a wheelchair)?: Total Help needed standing up from a chair using your arms (e.g., wheelchair or bedside chair)?: Total Help needed to walk in hospital room?: Total Help needed climbing 3-5 steps with a railing? : Total 6 Click Score: 12    End of Session   Activity Tolerance: No increased pain (pre-medicated) Patient left: in bed;with call bell/phone within reach;with family/visitor present (sitting EOB with husband) Nurse Communication: Mobility status PT Visit Diagnosis: Unsteadiness on feet (R26.81);Repeated falls (R29.6);Muscle weakness (generalized) (M62.81);Pain Pain - Right/Left:  (bil) Pain - part of body:  (lower torso)    Time: 8574-8561 PT Time Calculation (min) (ACUTE ONLY): 13 min   Charges:   PT Evaluation $PT Eval Low Complexity: 1 Low  PT General Charges $$ ACUTE PT VISIT: 1 Visit          Macario RAMAN, PT Acute Rehabilitation Services  Office [phone]   Macario SHAUNNA Soja 04/02/2023, 3:03 PM

## 2023-04-03 DIAGNOSIS — B9561 Methicillin susceptible Staphylococcus aureus infection as the cause of diseases classified elsewhere: Secondary | ICD-10-CM | POA: Diagnosis not present

## 2023-04-03 DIAGNOSIS — M4644 Discitis, unspecified, thoracic region: Secondary | ICD-10-CM | POA: Diagnosis not present

## 2023-04-03 DIAGNOSIS — G039 Meningitis, unspecified: Secondary | ICD-10-CM | POA: Diagnosis not present

## 2023-04-03 DIAGNOSIS — Z5181 Encounter for therapeutic drug level monitoring: Secondary | ICD-10-CM

## 2023-04-03 DIAGNOSIS — M4622 Osteomyelitis of vertebra, cervical region: Secondary | ICD-10-CM

## 2023-04-03 DIAGNOSIS — G061 Intraspinal abscess and granuloma: Secondary | ICD-10-CM | POA: Diagnosis not present

## 2023-04-03 LAB — CBC WITH DIFFERENTIAL/PLATELET
Abs Immature Granulocytes: 0.31 10*3/uL — ABNORMAL HIGH (ref 0.00–0.07)
Basophils Absolute: 0.1 10*3/uL (ref 0.0–0.1)
Basophils Relative: 0 %
Eosinophils Absolute: 0.2 10*3/uL (ref 0.0–0.5)
Eosinophils Relative: 1 %
HCT: 30 % — ABNORMAL LOW (ref 36.0–46.0)
Hemoglobin: 9.8 g/dL — ABNORMAL LOW (ref 12.0–15.0)
Immature Granulocytes: 2 %
Lymphocytes Relative: 24 %
Lymphs Abs: 3.3 10*3/uL (ref 0.7–4.0)
MCH: 29.8 pg (ref 26.0–34.0)
MCHC: 32.7 g/dL (ref 30.0–36.0)
MCV: 91.2 fL (ref 80.0–100.0)
Monocytes Absolute: 0.9 10*3/uL (ref 0.1–1.0)
Monocytes Relative: 7 %
Neutro Abs: 9 10*3/uL — ABNORMAL HIGH (ref 1.7–7.7)
Neutrophils Relative %: 66 %
Platelets: 258 10*3/uL (ref 150–400)
RBC: 3.29 MIL/uL — ABNORMAL LOW (ref 3.87–5.11)
RDW: 13.7 % (ref 11.5–15.5)
WBC: 13.8 10*3/uL — ABNORMAL HIGH (ref 4.0–10.5)
nRBC: 0 % (ref 0.0–0.2)

## 2023-04-03 LAB — RENAL FUNCTION PANEL
Albumin: 2.6 g/dL — ABNORMAL LOW (ref 3.5–5.0)
Anion gap: 12 (ref 5–15)
BUN: 6 mg/dL (ref 6–20)
CO2: 28 mmol/L (ref 22–32)
Calcium: 8.6 mg/dL — ABNORMAL LOW (ref 8.9–10.3)
Chloride: 98 mmol/L (ref 98–111)
Creatinine, Ser: 0.65 mg/dL (ref 0.44–1.00)
GFR, Estimated: >60 mL/min (ref >60)
Glucose, Bld: 122 mg/dL — ABNORMAL HIGH (ref 70–99)
Phosphorus: 3.5 mg/dL (ref 2.5–4.6)
Potassium: 3.5 mmol/L (ref 3.5–5.1)
Sodium: 138 mmol/L (ref 135–145)

## 2023-04-03 LAB — CULTURE, BLOOD (ROUTINE X 2): Culture: NO GROWTH

## 2023-04-03 LAB — GLUCOSE, CAPILLARY
Glucose-Capillary: 155 mg/dL — ABNORMAL HIGH (ref 70–99)
Glucose-Capillary: 135 mg/dL — ABNORMAL HIGH (ref 70–99)
Glucose-Capillary: 117 mg/dL — ABNORMAL HIGH (ref 70–99)
Glucose-Capillary: 98 mg/dL (ref 70–99)

## 2023-04-03 NOTE — Progress Notes (Signed)
 [facility]  Date of Admission:  03/29/2023     Total days of antibiotics 7         ASSESSMENT:  Debbie Goodwin blood cultures from 04/01/23 have remained without growth to date in the setting of T11-T12 epidural abscess and prevertebral effusion from the skull base to T4. Requested TEE evaluation from Cardiology and appears she is not likely a candidate. Continue current dose of Cefazolin  while awaiting clearance of blood cultures for placement of PICC line. Discussed recommended plan of care of at least 6 weeks of antibiotics and need for re-imaging. Neurosurgery with no surgical interventions indicated at this time. Remaining medical and supportive care per Internal Medicine.   PLAN:  Continue current dose of Cefazolin .  Monitor blood cultures for clearance of bacteremia. PICC line AFTER blood cultures are cleared.  Will need re-imaging to check for resolution.  Remaining medical and supportive care per Internal Medicine.    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 Active Problems:   HTN (hypertension)   HLD (hyperlipidemia)   MSSA bacteremia   OSA (obstructive sleep apnea)   Hypokalemia   Hardware complicating wound infection (HCC)   Discitis of cervicothoracic region   Mitral valve insufficiency    amitriptyline   25-50 mg Oral QHS   buPROPion   300 mg Oral Daily   DULoxetine   60 mg Oral Daily   famotidine   20 mg Oral QHS   gabapentin   900 mg Oral Daily   insulin  aspart  0-15 Units Subcutaneous TID WC   insulin  aspart  0-5 Units Subcutaneous QHS   irbesartan   37.5 mg Oral Daily   levothyroxine   100 mcg Oral Q0600   lidocaine   1 patch Transdermal Q24H   metFORMIN   1,000 mg Oral BID WC   naproxen   500 mg Oral BID WC   pantoprazole   40 mg Oral Daily   rOPINIRole   3 mg Oral Daily   simvastatin   40 mg Oral q1800    SUBJECTIVE:  Afebrile overnight with no acute events. Feeling better. Tolerating antibiotics with no  adverse side effects.   Allergies  Allergen Reactions   Sulfa Antibiotics Hives     Review of Systems: Review of Systems  Constitutional:  Negative for chills, fever and weight loss.  Respiratory:  Negative for cough, shortness of breath and wheezing.   Cardiovascular:  Negative for chest pain and leg swelling.  Gastrointestinal:  Negative for abdominal pain, constipation, diarrhea, nausea and vomiting.  Skin:  Negative for rash.      OBJECTIVE: Vitals:   04/02/23 2034 04/03/23 0607 04/03/23 0753 04/03/23 1546  BP: 116/60 (!) 114/51 (!) 123/53 (!) 111/57  Pulse: 80 80 80 79  Resp: 18 18 18 18   Temp: 98.1 F (36.7 C) 98.6 F (37 C) 98 F (36.7 C) 98 F (36.7 C)  TempSrc:   Oral Oral  SpO2: 99% 97% 97% 98%  Weight:      Height:       Body mass index is 48.89 kg/m.  Physical Exam Constitutional:      General: She is not in acute distress.    Appearance: She is well-developed.  Cardiovascular:     Rate and Rhythm: Normal rate and regular rhythm.     Heart sounds: Normal heart sounds.  Pulmonary:     Effort: Pulmonary effort is normal.     Breath sounds: Normal breath sounds.  Skin:    General: Skin is warm and  dry.  Neurological:     Mental Status: She is alert and oriented to person, place, and time.     Lab Results Lab Results  Component Value Date   WBC 13.8 (H) 04/03/2023   HGB 9.8 (L) 04/03/2023   HCT 30.0 (L) 04/03/2023   MCV 91.2 04/03/2023   PLT 258 04/03/2023    Lab Results  Component Value Date   CREATININE 0.65 04/03/2023   BUN 6 04/03/2023   NA 138 04/03/2023   K 3.5 04/03/2023   CL 98 04/03/2023   CO2 28 04/03/2023    Lab Results  Component Value Date   ALT 37 04/01/2023   AST 36 04/01/2023   ALKPHOS 94 04/01/2023   BILITOT 1.1 04/01/2023     Microbiology: Recent Results (from the past 240 hours)  Resp panel by RT-PCR (RSV, Flu A&B, Covid) Anterior Nasal Swab     Status: None   Collection Time: 03/28/23  6:31 PM   Specimen:  Anterior Nasal Swab  Result Value Ref Range Status   SARS Coronavirus 2 by RT PCR NEGATIVE NEGATIVE Final    Comment: (NOTE) SARS-CoV-2 target nucleic acids are NOT DETECTED.  The SARS-CoV-2 RNA is generally detectable in upper respiratory specimens during the acute phase of infection. The lowest concentration of SARS-CoV-2 viral copies this assay can detect is 138 copies/mL. A negative result does not preclude SARS-Cov-2 infection and should not be used as the sole basis for treatment or other patient management decisions. A negative result may occur with  improper specimen collection/handling, submission of specimen other than nasopharyngeal swab, presence of viral mutation(s) within the areas targeted by this assay, and inadequate number of viral copies(<138 copies/mL). A negative result must be combined with clinical observations, patient history, and epidemiological information. The expected result is Negative.  Fact Sheet for Patients:  bloggercourse.com  Fact Sheet for Healthcare Providers:  seriousbroker.it  This test is no t yet approved or cleared by the [place]  FDA and  has been authorized for detection and/or diagnosis of SARS-CoV-2 by FDA under an Emergency Use Authorization (EUA). This EUA will remain  in effect (meaning this test can be used) for the duration of the COVID-19 declaration under Section 564(b)(1) of the Act, 21 U.S.C.section 360bbb-3(b)(1), unless the authorization is terminated  or revoked sooner.       Influenza A by PCR NEGATIVE NEGATIVE Final   Influenza B by PCR NEGATIVE NEGATIVE Final    Comment: (NOTE) The Xpert Xpress SARS-CoV-2/FLU/RSV plus assay is intended as an aid in the diagnosis of influenza from Nasopharyngeal swab specimens and should not be used as a sole basis for treatment. Nasal washings and aspirates are unacceptable for Xpert Xpress SARS-CoV-2/FLU/RSV testing.  Fact  Sheet for Patients: bloggercourse.com  Fact Sheet for Healthcare Providers: seriousbroker.it  This test is not yet approved or cleared by the [place]  FDA and has been authorized for detection and/or diagnosis of SARS-CoV-2 by FDA under an Emergency Use Authorization (EUA). This EUA will remain in effect (meaning this test can be used) for the duration of the COVID-19 declaration under Section 564(b)(1) of the Act, 21 U.S.C. section 360bbb-3(b)(1), unless the authorization is terminated or revoked.     Resp Syncytial Virus by PCR NEGATIVE NEGATIVE Final    Comment: (NOTE) Fact Sheet for Patients: bloggercourse.com  Fact Sheet for Healthcare Providers: seriousbroker.it  This test is not yet approved or cleared by the [place]  FDA and has been authorized for detection and/or diagnosis of  SARS-CoV-2 by FDA under an Emergency Use Authorization (EUA). This EUA will remain in effect (meaning this test can be used) for the duration of the COVID-19 declaration under Section 564(b)(1) of the Act, 21 U.S.C. section 360bbb-3(b)(1), unless the authorization is terminated or revoked.  Performed at [facility], [address] [place], [place] [address]   Blood culture (routine x 2)     Status: Abnormal   Collection Time: 03/28/23  8:17 PM   Specimen: Right Antecubital; Blood  Result Value Ref Range Status   Specimen Description   Final    RIGHT ANTECUBITAL Performed at [facility], [address], [place], [place] [address]    Special Requests   Final    BOTTLES DRAWN AEROBIC AND ANAEROBIC Blood Culture results may not be optimal due to an inadequate volume of blood received in culture bottles Performed at [facility], [address], [place], [place] [address]    Culture  Setup Time   Final    AEROBIC BOTTLE ONLY GRAM POSITIVE COCCI Gram Stain Report Called to,Read Back By  and Verified With: EMILY GANT @ 1053 ON 03/29/23 C VARNER GRAM STAIN REVIEWED-AGREE WITH RESULT DRT    Culture (A)  Final    STAPHYLOCOCCUS AUREUS SUSCEPTIBILITIES PERFORMED ON PREVIOUS CULTURE WITHIN THE LAST 5 DAYS. Performed at [facility], [address], [place], [place] [address]    Report Status 03/31/2023 FINAL  Final  Blood culture (routine x 2)     Status: Abnormal   Collection Time: 03/28/23  8:53 PM   Specimen: BLOOD LEFT HAND  Result Value Ref Range Status   Specimen Description   Final    BLOOD LEFT HAND Performed at [facility], [address], [place], [place] [address]    Special Requests   Final    BOTTLES DRAWN AEROBIC AND ANAEROBIC Blood Culture adequate volume Performed at [facility], [address] [place], [place] [address]    Culture  Setup Time   Final    GRAM POSITIVE COCCI IN BOTH AEROBIC AND ANAEROBIC BOTTLES Gram Stain Report Called to,Read Back By and Verified With: DOSS M @ 1300 ON [id number] BY HENDERSON L GRAM STAIN REVIEWED-AGREE WITH RESULT DRT CRITICAL RESULT CALLED TO, READ BACK BY AND VERIFIED WITH: RN CHRISTY EDWARDS ON 03/29/23 @ 1916 BY DRT Performed at [facility], [address], [place], [place] [address]    Culture STAPHYLOCOCCUS AUREUS (A)  Final   Report Status 03/31/2023 FINAL  Final   Organism ID, Bacteria STAPHYLOCOCCUS AUREUS  Final      Susceptibility   Staphylococcus aureus - MIC*    CIPROFLOXACIN <=0.5 SENSITIVE Sensitive     ERYTHROMYCIN <=0.25 SENSITIVE Sensitive     GENTAMICIN <=0.5 SENSITIVE Sensitive     OXACILLIN 0.5 SENSITIVE Sensitive     TETRACYCLINE <=1 SENSITIVE Sensitive     VANCOMYCIN  1 SENSITIVE Sensitive     TRIMETH/SULFA <=10 SENSITIVE Sensitive     CLINDAMYCIN <=0.25 SENSITIVE Sensitive     RIFAMPIN <=0.5 SENSITIVE Sensitive     Inducible Clindamycin NEGATIVE Sensitive     LINEZOLID 2 SENSITIVE Sensitive     * STAPHYLOCOCCUS AUREUS  Blood Culture ID Panel (Reflexed)     Status: Abnormal    Collection Time: 03/28/23  8:53 PM  Result Value Ref Range Status   Enterococcus faecalis NOT DETECTED NOT DETECTED Final   Enterococcus Faecium NOT DETECTED NOT DETECTED Final   Listeria monocytogenes NOT DETECTED NOT DETECTED Final  Staphylococcus species DETECTED (A) NOT DETECTED Final    Comment: CRITICAL RESULT CALLED TO, READ BACK BY AND VERIFIED WITH: RN CHRISTY EDWARDS ON 03/29/23 @ 1916 BY DRT    Staphylococcus aureus (BCID) DETECTED (A) NOT DETECTED Final    Comment: CRITICAL RESULT CALLED TO, READ BACK BY AND VERIFIED WITH: RN CHRISTY EDWARDS ON 03/29/23 @ 1916 BY DRT    Staphylococcus epidermidis NOT DETECTED NOT DETECTED Final   Staphylococcus lugdunensis NOT DETECTED NOT DETECTED Final   Streptococcus species NOT DETECTED NOT DETECTED Final   Streptococcus agalactiae NOT DETECTED NOT DETECTED Final   Streptococcus pneumoniae NOT DETECTED NOT DETECTED Final   Streptococcus pyogenes NOT DETECTED NOT DETECTED Final   A.calcoaceticus-baumannii NOT DETECTED NOT DETECTED Final   Bacteroides fragilis NOT DETECTED NOT DETECTED Final   Enterobacterales NOT DETECTED NOT DETECTED Final   Enterobacter cloacae complex NOT DETECTED NOT DETECTED Final   Escherichia coli NOT DETECTED NOT DETECTED Final   Klebsiella aerogenes NOT DETECTED NOT DETECTED Final   Klebsiella oxytoca NOT DETECTED NOT DETECTED Final   Klebsiella pneumoniae NOT DETECTED NOT DETECTED Final   Proteus species NOT DETECTED NOT DETECTED Final   Salmonella species NOT DETECTED NOT DETECTED Final   Serratia marcescens NOT DETECTED NOT DETECTED Final   Haemophilus influenzae NOT DETECTED NOT DETECTED Final   Neisseria meningitidis NOT DETECTED NOT DETECTED Final   Pseudomonas aeruginosa NOT DETECTED NOT DETECTED Final   Stenotrophomonas maltophilia NOT DETECTED NOT DETECTED Final   Candida albicans NOT DETECTED NOT DETECTED Final   Candida auris NOT DETECTED NOT DETECTED Final   Candida glabrata NOT DETECTED NOT  DETECTED Final   Candida krusei NOT DETECTED NOT DETECTED Final   Candida parapsilosis NOT DETECTED NOT DETECTED Final   Candida tropicalis NOT DETECTED NOT DETECTED Final   Cryptococcus neoformans/gattii NOT DETECTED NOT DETECTED Final   Meth resistant mecA/C and MREJ NOT DETECTED NOT DETECTED Final    Comment: Performed at [facility], [address], [place], [place] [address]  Culture, blood (routine x 2)     Status: Abnormal   Collection Time: 03/29/23  5:29 PM   Specimen: BLOOD  Result Value Ref Range Status   Specimen Description   Final    BLOOD RFOA Performed at [facility], [address], [place], [place] [address]    Special Requests   Final    BOTTLES DRAWN AEROBIC AND ANAEROBIC Blood Culture results may not be optimal due to an inadequate volume of blood received in culture bottles Performed at [facility], [address] [place], [place] [address]    Culture  Setup Time   Final    GRAM POSITIVE COCCI ANAEROBIC BOTTLE ONLY Gram Stain Report Called to,Read Back By and Verified With: DOROTHA NED @MC  [phone], VIRAY,J Performed at [facility], [address], [place], [place] [address]    Culture STAPHYLOCOCCUS AUREUS (A)  Final   Report Status 04/02/2023 FINAL  Final   Organism ID, Bacteria STAPHYLOCOCCUS AUREUS  Final      Susceptibility   Staphylococcus aureus - MIC*    CIPROFLOXACIN <=0.5 SENSITIVE Sensitive     ERYTHROMYCIN <=0.25 SENSITIVE Sensitive     GENTAMICIN <=0.5 SENSITIVE Sensitive     OXACILLIN 0.5 SENSITIVE Sensitive     TETRACYCLINE <=1 SENSITIVE Sensitive     VANCOMYCIN  1 SENSITIVE Sensitive     TRIMETH/SULFA <=10 SENSITIVE Sensitive     CLINDAMYCIN <=0.25 SENSITIVE Sensitive     RIFAMPIN <=0.5 SENSITIVE Sensitive  Inducible Clindamycin NEGATIVE Sensitive     LINEZOLID 2 SENSITIVE Sensitive     * STAPHYLOCOCCUS AUREUS  Culture, blood (routine x 2)     Status: None   Collection Time: 03/29/23  5:35 PM   Specimen: BLOOD LEFT FOREARM   Result Value Ref Range Status   Specimen Description BLOOD LEFT FOREARM  Final   Special Requests   Final    BOTTLES DRAWN AEROBIC ONLY Blood Culture results may not be optimal due to an inadequate volume of blood received in culture bottles   Culture   Final    NO GROWTH 5 DAYS Performed at [facility], [address], [place], [place] [address]    Report Status 04/03/2023 FINAL  Final  Culture, blood (Routine X 2) w Reflex to ID Panel     Status: None (Preliminary result)   Collection Time: 04/01/23  2:18 PM   Specimen: BLOOD LEFT HAND  Result Value Ref Range Status   Specimen Description BLOOD LEFT HAND  Final   Special Requests   Final    BOTTLES DRAWN AEROBIC AND ANAEROBIC Blood Culture results may not be optimal due to an inadequate volume of blood received in culture bottles   Culture   Final    NO GROWTH 2 DAYS Performed at [facility], [address] [place], [place] [address]    Report Status PENDING  Incomplete  Culture, blood (Routine X 2) w Reflex to ID Panel     Status: None (Preliminary result)   Collection Time: 04/01/23  2:18 PM   Specimen: BLOOD RIGHT HAND  Result Value Ref Range Status   Specimen Description BLOOD RIGHT HAND  Final   Special Requests   Final    BOTTLES DRAWN AEROBIC AND ANAEROBIC Blood Culture results may not be optimal due to an inadequate volume of blood received in culture bottles   Culture   Final    NO GROWTH 2 DAYS Performed at [facility], [address], [place], [place] [address]    Report Status PENDING  Incomplete     Cathlyn July, NP [facility]  04/03/2023  4:30 PM

## 2023-04-03 NOTE — Plan of Care (Signed)

## 2023-04-03 NOTE — Evaluation (Signed)
 Occupational Therapy Evaluation Patient Details Name: Debbie Goodwin MRN: [id number] DOB: 1971-01-18 Today's Date: 04/03/2023   History of Present Illness 53 y.o. female  presents to ED 03/29/2023 as she was called back for positive blood cultures. ED visit 12/31 secondary to complaints of a fever. MRI showed prevertebral effusion from skull base to T4 --13 mm at C3-C4-C7 ACDF without residual spinal canal neuroforaminal stenosis; L-spine MRI posterior epidural collection T11-T12 suspicious for epidural abscess; MRI thoracic spine posterior epidural collection T11-12 with mass effect on posterior cord 18 x 7 cm   ED discussed with neurosurgery on-call who indicated that fluid is noninfectious and does not appear to be contributing to her bacteremia  PMH obesity, hypertension, fibromyalgia, hyperlipidemia, diabetes mellitus, chronic cervical radiculopathy, status post decompression surgery by Dr. Mavis in 2022,  lumbar steroid injection every 72-month by Dr. Darlis, most recent in September 2024.   Clinical Impression   PTA, pt lives with spouse and typically completely Independent in ADLs, IADLs and mobility without AD. Pt presents now with deficits in back discomfort, endurance and BLE strength. Pt reports significant improvements in pain and mobility today - able to mobilize to bathroom and around room with CGA though pt reaching out to furniture for support. Pt requires no more than CGA for standing ADLs. Educated re: energy conservation and back precautions for comfort during LB ADLs. Recommend trial of RW for gait to maximize safety and stability. Feel pt will continue to progress acutely to return home with HHOT.      If plan is discharge home, recommend the following: A little help with walking and/or transfers;A little help with bathing/dressing/bathroom;Assistance with cooking/housework;Help with stairs or ramp for entrance    Functional Status Assessment  Patient has had a recent decline  in their functional status and demonstrates the ability to make significant improvements in function in a reasonable and predictable amount of time.  Equipment Recommendations  BSC/3in1    Recommendations for Other Services       Precautions / Restrictions Precautions Precautions: Fall Precaution Comments: reports 2 falls PTA, back precautions for comfort Restrictions Weight Bearing Restrictions Per Provider Order: No      Mobility Bed Mobility Overal bed mobility: Modified Independent                  Transfers Overall transfer level: Needs assistance Equipment used: None Transfers: Sit to/from Stand Sit to Stand: Supervision           General transfer comment: no assist to stand and no DME. cues for IV line safety. able to stand from bed and then regular toilet with use of grab bar      Balance Overall balance assessment: Needs assistance Sitting-balance support: No upper extremity supported, Feet supported Sitting balance-Leahy Scale: Good     Standing balance support: No upper extremity supported, During functional activity Standing balance-Leahy Scale: Fair                             ADL either performed or assessed with clinical judgement   ADL Overall ADL's : Needs assistance/impaired Eating/Feeding: Independent   Grooming: Contact guard assist;Standing;Oral care Grooming Details (indicate cue type and reason): some LE shakiness noted initially standing at sink though pt propped UE on sink counter and able to brush teeth without issue Upper Body Bathing: Set up;Sitting   Lower Body Bathing: Contact guard assist;Sit to/from stand;Sitting/lateral leans   Upper Body Dressing : Set up;Sitting  Lower Body Dressing: Contact guard assist;Sit to/from stand;Sitting/lateral leans Lower Body Dressing Details (indicate cue type and reason): able to bring B feet to self easily, discussed back precautions for comfort with LB dressing Toilet  Transfer: Contact guard assist;Ambulation Toilet Transfer Details (indicate cue type and reason): no AD though pt with observed furniture walking Toileting- Clothing Manipulation and Hygiene: Supervision/safety;Sitting/lateral lean;Sit to/from stand Toileting - Clothing Manipulation Details (indicate cue type and reason): performed hygiene seated on toilet     Functional mobility during ADLs: Contact guard assist General ADL Comments: Would likely benefit from RW for longer distance mobility due to furniture walking observed. discussed use of BSC as shower chair for safety, energy conservation with showering tasks.     Vision   Vision Assessment?: No apparent visual deficits     Perception         Praxis         Pertinent Vitals/Pain Pain Assessment Pain Assessment: Faces Faces Pain Scale: Hurts a little bit Pain Location: B flank around to lower/mid back Pain Descriptors / Indicators: Discomfort, Sore Pain Intervention(s): Monitored during session     Extremity/Trunk Assessment     Lower Extremity Assessment Lower Extremity Assessment: Defer to PT evaluation   Cervical / Trunk Assessment Cervical / Trunk Assessment: Normal   Communication Communication Communication: No apparent difficulties   Cognition Arousal: Alert Behavior During Therapy: WFL for tasks assessed/performed Overall Cognitive Status: Within Functional Limits for tasks assessed                                       General Comments       Exercises     Shoulder Instructions      Home Living Family/patient expects to be discharged to:: Private residence Living Arrangements: Spouse/significant other Available Help at Discharge: Family;Available PRN/intermittently;Friend(s) Type of Home: House Home Access: Stairs to enter [employer] of Steps: 2+1 Entrance Stairs-Rails: Right Home Layout: Two level;Laundry or work area in basement;Able to live on main level with  bedroom/bathroom     Bathroom Shower/Tub: Producer, Television/film/video: Standard     Home Equipment: Agricultural Consultant (2 wheels) (RW was her late mother's)   Additional Comments: Has friends and other family that can provide assist at DC if husband at work      Prior Functioning/Environment Prior Level of Function : Independent/Modified Independent;Driving             Mobility Comments: no device ADLs Comments: Indep with ADLs, IADLs. some limitations due to progressive neck/back issues (getting epidural shots >1 year). Recent fall when taking dogs outside        OT Problem List: Decreased strength;Decreased activity tolerance;Impaired balance (sitting and/or standing);Decreased knowledge of use of DME or AE;Pain      OT Treatment/Interventions: Self-care/ADL training;Therapeutic exercise;Energy conservation;DME and/or AE instruction;Therapeutic activities;Patient/family education;Balance training    OT Goals(Current goals can be found in the care plan section) Acute Rehab OT Goals Patient Stated Goal: further improvements in back pain and leg strength OT Goal Formulation: With patient Time For Goal Achievement: 04/17/23 Potential to Achieve Goals: Good  OT Frequency: Min 1X/week    Co-evaluation              AM-PAC OT 6 Clicks Daily Activity     Outcome Measure Help from another person eating meals?: None Help from another person taking care of personal grooming?: A  Little Help from another person toileting, which includes using toliet, bedpan, or urinal?: A Little Help from another person bathing (including washing, rinsing, drying)?: A Little Help from another person to put on and taking off regular upper body clothing?: A Little Help from another person to put on and taking off regular lower body clothing?: A Little 6 Click Score: 19   End of Session Nurse Communication: Mobility status  Activity Tolerance: Patient tolerated treatment well Patient  left: in bed;with call bell/phone within reach  OT Visit Diagnosis: Unsteadiness on feet (R26.81);Muscle weakness (generalized) (M62.81)                Time: 9092-9067 OT Time Calculation (min): 25 min Charges:  OT General Charges $OT Visit: 1 Visit OT Evaluation $OT Eval Moderate Complexity: 1 Mod OT Treatments $Self Care/Home Management : 8-22 mins  [name], OTR/L Acute Rehab Services Office: [phone]   Mliss Getting 04/03/2023, 10:26 AM

## 2023-04-03 NOTE — Progress Notes (Signed)
 HOSPITALIST                ROUNDING                 NOTE Debbie Goodwin FMW:[id number]  DOB: 1971-03-18  DOA: 03/29/2023  PCP: Bertell Satterfield, MD  04/03/2023,10:31 AM   LOS: 5 days      Code Status: Full code presumed From: Home  current Dispo: Likely home     53 year old white female Known colonic diverticuli followed by Dr. 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 Darlis affiliated with [place] [facility]  Patient came to [facility] ED after having 2 months of increasing weakness with ambulation difficulty getting around and instability on her hips and legs She had been finding it more difficult to move around and had pain radiating from around her thoracic spine around to her abdomen She presented with accidental fall with new  fever-- chest pain back pain 03/28/23 Tmax 102.6-COVID flu were negative CT head negative CT cervical spine prevertebral soft tissue swelling urine analysis unremarkable Toradol  given with improvement-MRI was done and showed fluid collection skull to T4 She was sent home with azithromycin  and amoxicillin  but called back because blood cultures grew MSSA in 2 bottles  workup CT = trace pleural effusions dependent opacities atelectasis versus pneumonia-hepatomegaly with steatosis?  Cirrhosis?  Small bowel thickening edema cervical prevertebral soft tissue fluid edema in the upper thorax MRI showed prevertebral effusion from skull base to T4 --13 mm at C3-C4-C7 ACDF without residual spinal canal neuroforaminal stenosis L-spine MRI posterior epidural collection T11-T12 suspicious for epidural abscess MRI thoracic spine posterior epidural  collection T11-12 with mass effect on posterior cord 18 x 7 cm  WBC 8.9 lactic acid 1.5 platelet 131  potassium 2.7 ESR 55 CRP 21 UA unremarkable for UTI  Infectious diseases telemetry consulted-patient sent to [facility] for TTE further workup 1/2 echocardiogram indeterminate diastolic parameters aortic and mitral valve no gross vegetation 1/3 neurosurgery consulted 1/3 ENT consulted    Plan  MSSA back infection BC X2 on 12/31, 1/1 blood culture still has staph-has 2 possible separate sitesT11-T12 epidural abscess mass effect 18X 7 mm--- skull base to T4 fluid 13 mmc ollection in setting ACDF NeuroS Dr. Mavis has seen patient -recommends nonoperative management, has no neurovascular compromise If continues to improve may be able to discharge with cefazolin  monotherapy-and a repeat MRI in 1 to 2 weeks-- will need 6 weeks total therapy--- maybe we can place PICC line and complete treatment--repeat blood culture 1/4 is negative ID following-TTE performed-unclear if needs TEE or if this would change management   Acute pain from Fall--I think the fall causing majority of her pain-she is overall better  infection superimposed on chronic low back pain [Home regimen in 04/02/2023 note] Pain is better controlled with current regimen: Oxy IR 15 every 6 as needed Naprosyn  500 twice daily Flexeril  10 3 times daily as needed Neurontin  900 daily lidocaine  patch every 12 --- also has several other agents as below Can use IV morphine  2 mg every 2 as needed severe pain  HTN Continues on avapro  37.5 hydralazine  5 every 4 as needed  Blood pressure moderately controlled  Depression Continues on Xanax  0.25 x 3 times daily,  amitriptyline  25 at 50 at bedtime, bupropion  XL 300, Cymbalta  60 Recommend outpatient de-escalation of meds  Class IV obesity BMI >48 DM TY 2 Resume metformin  1000 twice daily---hold phentermine-use sliding scale coverage moderate with at bedtime--- CBG ranging 1 50-1 80 Resume  nighttime CPAP as per orders  Reflux GERD as well as prior hiatal hernia and Schatzki ring Continue Pepcid  20 daily, Protonix  40 daily  DVT prophylaxis: SCD  Status is: Inpatient Remains inpatient appropriate because:   Requires further workup   Subjective:  Pain much better she is able to sit up in bed she has mild pain turning left and right She has no fever Overall she feels better and improved  Objective + exam Vitals:   04/02/23 1614 04/02/23 2034 04/03/23 0607 04/03/23 0753  BP: 117/68 116/60 (!) 114/51 (!) 123/53  Pulse: 88 80 80 80  Resp: 17 18 18 18   Temp: 98.7 F (37.1 C) 98.1 F (36.7 C) 98.6 F (37 C) 98 F (36.7 C)  TempSrc: Oral   Oral  SpO2: 98% 99% 97% 97%  Weight:      Height:       Filed Weights   03/29/23 1605  Weight: 125.2 kg    Examination:   Coherent awake alert sitting up in bed does not seem to be uncomfortable Chest is clear She is able to turn and actually rotate her lumbar spine without significant pain although it is a little uncomfortable Her foot drop is somewhat resolved on the left side She is able to straight leg raise bilaterally    Data Reviewed: reviewed   CBC    Component Value Date/Time   WBC 13.8 (H) 04/03/2023 0332   RBC 3.29 (L) 04/03/2023 0332   HGB 9.8 (L) 04/03/2023 0332   HCT 30.0 (L) 04/03/2023 0332   PLT 258 04/03/2023 0332   MCV 91.2 04/03/2023 0332   MCH 29.8 04/03/2023 0332   MCHC 32.7 04/03/2023 0332   RDW 13.7 04/03/2023 0332   LYMPHSABS 3.3 04/03/2023 0332   MONOABS 0.9 04/03/2023 0332   EOSABS 0.2 04/03/2023 0332   BASOSABS 0.1 04/03/2023 0332      Latest Ref Rng & Units 04/03/2023    3:32 AM 04/01/2023    8:11 AM 03/31/2023    6:57 AM  CMP  Glucose 70 - 99 mg/dL 877  855  716   BUN 6 - 20 mg/dL 6  8  9    Creatinine 0.44 - 1.00 mg/dL 9.34  9.28  9.15   Sodium 135 - 145 mmol/L 138  132  131   Potassium 3.5 - 5.1 mmol/L 3.5  3.5  3.5   Chloride 98 - 111 mmol/L 98  95  95   CO2 22 - 32  mmol/L 28  23  22    Calcium 8.9 - 10.3 mg/dL 8.6  8.4  8.0   Total Protein 6.5 - 8.1 g/dL  6.9    Total Bilirubin 0.0 - 1.2 mg/dL  1.1    Alkaline Phos 38 - 126 U/L  94    AST 15 - 41 U/L  36    ALT 0 - 44 U/L  37      Scheduled Meds:  amitriptyline   25-50 mg Oral QHS   buPROPion   300 mg Oral Daily   DULoxetine   60 mg Oral Daily   famotidine   20 mg Oral QHS   gabapentin   900 mg Oral Daily   insulin  aspart  0-15 Units Subcutaneous TID WC  insulin  aspart  0-5 Units Subcutaneous QHS   irbesartan   37.5 mg Oral Daily   levothyroxine   100 mcg Oral Q0600   lidocaine   1 patch Transdermal Q24H   metFORMIN   1,000 mg Oral BID WC   naproxen   500 mg Oral BID WC   pantoprazole   40 mg Oral Daily   rOPINIRole   3 mg Oral Daily   simvastatin   40 mg Oral q1800   Continuous Infusions:   ceFAZolin  (ANCEF ) IV 2 g (04/03/23 0555)    Time 30  Jai-Gurmukh Shonia Skilling, MD  [facility]

## 2023-04-03 NOTE — Progress Notes (Signed)
 Subjective: The patient is alert and pleasant.  She feels much better.  She denies lower extremity numbness, tingling weakness, etc.  Objective: Vital signs in last 24 hours: Temp:  [98 F (36.7 C)-98.7 F (37.1 C)] 98 F (36.7 C) (01/06 0753) Pulse Rate:  [80-89] 80 (01/06 0753) Resp:  [17-18] 18 (01/06 0753) BP: (109-123)/(51-68) 123/53 (01/06 0753) SpO2:  [96 %-99 %] 97 % (01/06 0753) Estimated body mass index is 48.89 kg/m as calculated from the following:   Height as of this encounter: 5' 3 (1.6 m).   Weight as of this encounter: 125.2 kg.   Intake/Output from previous day: 01/05 0701 - 01/06 0700 In: 480 [P.O.:480] Out: 0  Intake/Output this shift: No intake/output data recorded.  Physical exam the patient is alert and oriented.  Her strength and sensation is normal in her lower extremities.  Lab Results: Recent Labs    04/01/23 0811 04/03/23 0332  WBC 12.5* 13.8*  HGB 10.1* 9.8*  HCT 30.9* 30.0*  PLT 212 258   BMET Recent Labs    04/01/23 0811 04/03/23 0332  NA 132* 138  K 3.5 3.5  CL 95* 98  CO2 23 28  GLUCOSE 144* 122*  BUN 8 6  CREATININE 0.71 0.65  CALCIUM 8.4* 8.6*    Studies/Results: No results found.  Assessment/Plan: T11-12 abscess: The patient is responding to treatment.  I do not think she will need surgery.  I will check on her follow-up MRI.  LOS: 5 days     Debbie Goodwin 04/03/2023, 8:08 AM     Patient ID: Debbie Goodwin, female   DOB: 08-Apr-1970, 53 y.o.   MRN: [id number]

## 2023-04-04 DIAGNOSIS — G061 Intraspinal abscess and granuloma: Secondary | ICD-10-CM | POA: Diagnosis not present

## 2023-04-04 DIAGNOSIS — B9561 Methicillin susceptible Staphylococcus aureus infection as the cause of diseases classified elsewhere: Secondary | ICD-10-CM | POA: Diagnosis not present

## 2023-04-04 DIAGNOSIS — G062 Extradural and subdural abscess, unspecified: Secondary | ICD-10-CM

## 2023-04-04 DIAGNOSIS — G039 Meningitis, unspecified: Secondary | ICD-10-CM | POA: Diagnosis not present

## 2023-04-04 DIAGNOSIS — M4644 Discitis, unspecified, thoracic region: Secondary | ICD-10-CM | POA: Diagnosis not present

## 2023-04-04 LAB — CBC WITH DIFFERENTIAL/PLATELET
Abs Immature Granulocytes: 0.21 10*3/uL — ABNORMAL HIGH (ref 0.00–0.07)
Basophils Absolute: 0.1 10*3/uL (ref 0.0–0.1)
Basophils Relative: 0 %
Eosinophils Absolute: 0.2 10*3/uL (ref 0.0–0.5)
Eosinophils Relative: 1 %
HCT: 28.8 % — ABNORMAL LOW (ref 36.0–46.0)
Hemoglobin: 9.2 g/dL — ABNORMAL LOW (ref 12.0–15.0)
Immature Granulocytes: 2 %
Lymphocytes Relative: 29 %
Lymphs Abs: 3.3 10*3/uL (ref 0.7–4.0)
MCH: 29 pg (ref 26.0–34.0)
MCHC: 31.9 g/dL (ref 30.0–36.0)
MCV: 90.9 fL (ref 80.0–100.0)
Monocytes Absolute: 0.8 10*3/uL (ref 0.1–1.0)
Monocytes Relative: 7 %
Neutro Abs: 6.7 10*3/uL (ref 1.7–7.7)
Neutrophils Relative %: 61 %
Platelets: 273 10*3/uL (ref 150–400)
RBC: 3.17 MIL/uL — ABNORMAL LOW (ref 3.87–5.11)
RDW: 13.7 % (ref 11.5–15.5)
WBC: 11.2 10*3/uL — ABNORMAL HIGH (ref 4.0–10.5)
nRBC: 0.2 % (ref 0.0–0.2)

## 2023-04-04 LAB — GLUCOSE, CAPILLARY
Glucose-Capillary: 128 mg/dL — ABNORMAL HIGH (ref 70–99)
Glucose-Capillary: 141 mg/dL — ABNORMAL HIGH (ref 70–99)
Glucose-Capillary: 98 mg/dL (ref 70–99)
Glucose-Capillary: 130 mg/dL — ABNORMAL HIGH (ref 70–99)
Glucose-Capillary: 111 mg/dL — ABNORMAL HIGH (ref 70–99)

## 2023-04-04 LAB — RENAL FUNCTION PANEL
Albumin: 2.7 g/dL — ABNORMAL LOW (ref 3.5–5.0)
Anion gap: 12 (ref 5–15)
BUN: 6 mg/dL (ref 6–20)
CO2: 27 mmol/L (ref 22–32)
Calcium: 8.7 mg/dL — ABNORMAL LOW (ref 8.9–10.3)
Chloride: 102 mmol/L (ref 98–111)
Creatinine, Ser: 0.71 mg/dL (ref 0.44–1.00)
GFR, Estimated: >60 mL/min (ref >60)
Glucose, Bld: 119 mg/dL — ABNORMAL HIGH (ref 70–99)
Phosphorus: 4.1 mg/dL (ref 2.5–4.6)
Potassium: 3.6 mmol/L (ref 3.5–5.1)
Sodium: 141 mmol/L (ref 135–145)

## 2023-04-04 NOTE — Progress Notes (Addendum)
 Subjective: No new complaints   Antibiotics:  Anti-infectives (From admission, onward)    Start     Dose/Rate Route Frequency Ordered Stop   04/01/23 0945  fluconazole  (DIFLUCAN ) tablet 150 mg        150 mg Oral  Once 04/01/23 0849 04/01/23 1011   03/30/23 1000  cefTRIAXone  (ROCEPHIN ) 2 g in sodium chloride  0.9 % 100 mL IVPB  Status:  Discontinued        2 g 200 mL/hr over 30 Minutes Intravenous Every 24 hours 03/29/23 1950 03/30/23 0746   03/30/23 1000  ceFAZolin  (ANCEF ) IVPB 2g/100 mL premix        2 g 200 mL/hr over 30 Minutes Intravenous Every 8 hours 03/30/23 0834     03/30/23 0600  vancomycin  (VANCOCIN ) IVPB 1000 mg/200 mL premix  Status:  Discontinued        1,000 mg 200 mL/hr over 60 Minutes Intravenous Every 12 hours 03/29/23 2021 03/30/23 0746   03/29/23 1715  vancomycin  (VANCOCIN ) IVPB 1000 mg/200 mL premix        1,000 mg 200 mL/hr over 60 Minutes Intravenous  Once 03/29/23 1708 03/29/23 1854   03/29/23 1715  ceFEPIme  (MAXIPIME ) 2 g in sodium chloride  0.9 % 100 mL IVPB        2 g 200 mL/hr over 30 Minutes Intravenous  Once 03/29/23 1708 03/29/23 1829       Medications: Scheduled Meds:  amitriptyline   25-50 mg Oral QHS   buPROPion   300 mg Oral Daily   DULoxetine   60 mg Oral Daily   famotidine   20 mg Oral QHS   gabapentin   900 mg Oral Daily   insulin  aspart  0-15 Units Subcutaneous TID WC   insulin  aspart  0-5 Units Subcutaneous QHS   irbesartan   37.5 mg Oral Daily   levothyroxine   100 mcg Oral Q0600   lidocaine   1 patch Transdermal Q24H   metFORMIN   1,000 mg Oral BID WC   naproxen   500 mg Oral BID WC   pantoprazole   40 mg Oral Daily   rOPINIRole   3 mg Oral Daily   simvastatin   40 mg Oral q1800   Continuous Infusions:   ceFAZolin  (ANCEF ) IV 2 g (04/04/23 1409)   PRN Meds:.acetaminophen  **OR** acetaminophen , albuterol , ALPRAZolam , bisacodyl , cyclobenzaprine , hydrALAZINE , morphine  injection, oxyCODONE , oxyCODONE , polyethylene glycol, polyvinyl  alcohol     Objective: Weight change:   Intake/Output Summary (Last 24 hours) at 04/04/2023 1432 Last data filed at 04/04/2023 0546 Gross per 24 hour  Intake 240 ml  Output 0 ml  Net 240 ml   Blood pressure 131/68, pulse 78, temperature 98 F (36.7 C), temperature source Oral, resp. rate 18, height 5' 3 (1.6 m), weight 125.2 kg, last menstrual period 07/26/2020, SpO2 95%. Temp:  [98 F (36.7 C)-98.4 F (36.9 C)] 98 F (36.7 C) (01/07 0706) Pulse Rate:  [78-89] 78 (01/07 0706) Resp:  [18] 18 (01/07 0706) BP: (111-133)/(57-78) 131/68 (01/07 0706) SpO2:  [95 %-100 %] 95 % (01/07 0706) FiO2 (%):  [21 %] 21 % (01/06 2221)  Physical Exam: Physical Exam Constitutional:      General: She is not in acute distress.    Appearance: She is well-developed. She is not diaphoretic.  HENT:     Head: Normocephalic and atraumatic.     Right Ear: External ear normal.     Left Ear: External ear normal.     Mouth/Throat:     Pharynx: No oropharyngeal exudate.  Eyes:     General: No scleral icterus.    Conjunctiva/sclera: Conjunctivae normal.     Pupils: Pupils are equal, round, and reactive to light.  Cardiovascular:     Rate and Rhythm: Normal rate and regular rhythm.     Heart sounds: No murmur heard.    No gallop.  Pulmonary:     Effort: Pulmonary effort is normal. No respiratory distress.     Breath sounds: Normal breath sounds. No wheezing or rales.  Abdominal:     General: Bowel sounds are normal. There is no distension.     Palpations: Abdomen is soft.     Tenderness: There is no abdominal tenderness. There is no rebound.  Musculoskeletal:        General: No tenderness. Normal range of motion.  Lymphadenopathy:     Cervical: No cervical adenopathy.  Skin:    General: Skin is warm and dry.  Neurological:     General: No focal deficit present.     Mental Status: She is alert and oriented to person, place, and time.     Motor: No abnormal muscle tone.     Coordination:  Coordination normal.  Psychiatric:        Mood and Affect: Mood normal.        Behavior: Behavior normal.        Thought Content: Thought content normal.        Judgment: Judgment normal.      CBC:    BMET Recent Labs    04/03/23 0332 04/04/23 0335  NA 138 141  K 3.5 3.6  CL 98 102  CO2 28 27  GLUCOSE 122* 119*  BUN 6 6  CREATININE 0.65 0.71  CALCIUM 8.6* 8.7*     Liver Panel  Recent Labs    04/03/23 0332 04/04/23 0335  ALBUMIN 2.6* 2.7*       Sedimentation Rate No results for input(s): ESRSEDRATE in the last 72 hours. C-Reactive Protein No results for input(s): CRP in the last 72 hours.  Micro Results: Recent Results (from the past 720 hours)  Resp panel by RT-PCR (RSV, Flu A&B, Covid) Anterior Nasal Swab     Status: None   Collection Time: 03/28/23  6:31 PM   Specimen: Anterior Nasal Swab  Result Value Ref Range Status   SARS Coronavirus 2 by RT PCR NEGATIVE NEGATIVE Final    Comment: (NOTE) SARS-CoV-2 target nucleic acids are NOT DETECTED.  The SARS-CoV-2 RNA is generally detectable in upper respiratory specimens during the acute phase of infection. The lowest concentration of SARS-CoV-2 viral copies this assay can detect is 138 copies/mL. A negative result does not preclude SARS-Cov-2 infection and should not be used as the sole basis for treatment or other patient management decisions. A negative result may occur with  improper specimen collection/handling, submission of specimen other than nasopharyngeal swab, presence of viral mutation(s) within the areas targeted by this assay, and inadequate number of viral copies(<138 copies/mL). A negative result must be combined with clinical observations, patient history, and epidemiological information. The expected result is Negative.  Fact Sheet for Patients:  bloggercourse.com  Fact Sheet for Healthcare Providers:  seriousbroker.it  This  test is no t yet approved or cleared by the [place]  FDA and  has been authorized for detection and/or diagnosis of SARS-CoV-2 by FDA under an Emergency Use Authorization (EUA). This EUA will remain  in effect (meaning this test can be used) for the duration of the COVID-19 declaration under Section  564(b)(1) of the Act, 21 U.S.C.section 360bbb-3(b)(1), unless the authorization is terminated  or revoked sooner.       Influenza A by PCR NEGATIVE NEGATIVE Final   Influenza B by PCR NEGATIVE NEGATIVE Final    Comment: (NOTE) The Xpert Xpress SARS-CoV-2/FLU/RSV plus assay is intended as an aid in the diagnosis of influenza from Nasopharyngeal swab specimens and should not be used as a sole basis for treatment. Nasal washings and aspirates are unacceptable for Xpert Xpress SARS-CoV-2/FLU/RSV testing.  Fact Sheet for Patients: bloggercourse.com  Fact Sheet for Healthcare Providers: seriousbroker.it  This test is not yet approved or cleared by the [place]  FDA and has been authorized for detection and/or diagnosis of SARS-CoV-2 by FDA under an Emergency Use Authorization (EUA). This EUA will remain in effect (meaning this test can be used) for the duration of the COVID-19 declaration under Section 564(b)(1) of the Act, 21 U.S.C. section 360bbb-3(b)(1), unless the authorization is terminated or revoked.     Resp Syncytial Virus by PCR NEGATIVE NEGATIVE Final    Comment: (NOTE) Fact Sheet for Patients: bloggercourse.com  Fact Sheet for Healthcare Providers: seriousbroker.it  This test is not yet approved or cleared by the [place]  FDA and has been authorized for detection and/or diagnosis of SARS-CoV-2 by FDA under an Emergency Use Authorization (EUA). This EUA will remain in effect (meaning this test can be used) for the duration of the COVID-19 declaration under  Section 564(b)(1) of the Act, 21 U.S.C. section 360bbb-3(b)(1), unless the authorization is terminated or revoked.  Performed at [facility], [address], [place], [place] [address]   Blood culture (routine x 2)     Status: Abnormal   Collection Time: 03/28/23  8:17 PM   Specimen: Right Antecubital; Blood  Result Value Ref Range Status   Specimen Description   Final    RIGHT ANTECUBITAL Performed at [facility], [address] [place], [place] [address]    Special Requests   Final    BOTTLES DRAWN AEROBIC AND ANAEROBIC Blood Culture results may not be optimal due to an inadequate volume of blood received in culture bottles Performed at [facility], [address] [place], [place] [address]    Culture  Setup Time   Final    AEROBIC BOTTLE ONLY GRAM POSITIVE COCCI Gram Stain Report Called to,Read Back By and Verified With: EMILY GANT @ 1053 ON 03/29/23 C VARNER GRAM STAIN REVIEWED-AGREE WITH RESULT DRT    Culture (A)  Final    STAPHYLOCOCCUS AUREUS SUSCEPTIBILITIES PERFORMED ON PREVIOUS CULTURE WITHIN THE LAST 5 DAYS. Performed at [facility], [address], [place], [place] [address]    Report Status 03/31/2023 FINAL  Final  Blood culture (routine x 2)     Status: Abnormal   Collection Time: 03/28/23  8:53 PM   Specimen: BLOOD LEFT HAND  Result Value Ref Range Status   Specimen Description   Final    BLOOD LEFT HAND Performed at [facility], [address], [place], [place] [address]    Special Requests   Final    BOTTLES DRAWN AEROBIC AND ANAEROBIC Blood Culture adequate volume Performed at [facility], [address], [place], [place] [address]    Culture  Setup Time   Final    GRAM POSITIVE COCCI IN BOTH AEROBIC AND ANAEROBIC BOTTLES Gram Stain Report Called to,Read Back By and Verified With: DOSS M @ 1300 ON [id number] BY HENDERSON L GRAM STAIN REVIEWED-AGREE WITH RESULT DRT CRITICAL RESULT  CALLED TO, READ BACK BY AND VERIFIED WITH: RN CHRISTY EDWARDS ON  03/29/23 @ 1916 BY DRT Performed at [facility], [address], [place], [place] [address]    Culture STAPHYLOCOCCUS AUREUS (A)  Final   Report Status 03/31/2023 FINAL  Final   Organism ID, Bacteria STAPHYLOCOCCUS AUREUS  Final      Susceptibility   Staphylococcus aureus - MIC*    CIPROFLOXACIN <=0.5 SENSITIVE Sensitive     ERYTHROMYCIN <=0.25 SENSITIVE Sensitive     GENTAMICIN <=0.5 SENSITIVE Sensitive     OXACILLIN 0.5 SENSITIVE Sensitive     TETRACYCLINE <=1 SENSITIVE Sensitive     VANCOMYCIN  1 SENSITIVE Sensitive     TRIMETH/SULFA <=10 SENSITIVE Sensitive     CLINDAMYCIN <=0.25 SENSITIVE Sensitive     RIFAMPIN <=0.5 SENSITIVE Sensitive     Inducible Clindamycin NEGATIVE Sensitive     LINEZOLID 2 SENSITIVE Sensitive     * STAPHYLOCOCCUS AUREUS  Blood Culture ID Panel (Reflexed)     Status: Abnormal   Collection Time: 03/28/23  8:53 PM  Result Value Ref Range Status   Enterococcus faecalis NOT DETECTED NOT DETECTED Final   Enterococcus Faecium NOT DETECTED NOT DETECTED Final   Listeria monocytogenes NOT DETECTED NOT DETECTED Final   Staphylococcus species DETECTED (A) NOT DETECTED Final    Comment: CRITICAL RESULT CALLED TO, READ BACK BY AND VERIFIED WITH: RN CHRISTY EDWARDS ON 03/29/23 @ 1916 BY DRT    Staphylococcus aureus (BCID) DETECTED (A) NOT DETECTED Final    Comment: CRITICAL RESULT CALLED TO, READ BACK BY AND VERIFIED WITH: RN CHRISTY EDWARDS ON 03/29/23 @ 1916 BY DRT    Staphylococcus epidermidis NOT DETECTED NOT DETECTED Final   Staphylococcus lugdunensis NOT DETECTED NOT DETECTED Final   Streptococcus species NOT DETECTED NOT DETECTED Final   Streptococcus agalactiae NOT DETECTED NOT DETECTED Final   Streptococcus pneumoniae NOT DETECTED NOT DETECTED Final   Streptococcus pyogenes NOT DETECTED NOT DETECTED Final   A.calcoaceticus-baumannii NOT DETECTED NOT DETECTED Final   Bacteroides fragilis NOT DETECTED NOT DETECTED Final   Enterobacterales NOT DETECTED  NOT DETECTED Final   Enterobacter cloacae complex NOT DETECTED NOT DETECTED Final   Escherichia coli NOT DETECTED NOT DETECTED Final   Klebsiella aerogenes NOT DETECTED NOT DETECTED Final   Klebsiella oxytoca NOT DETECTED NOT DETECTED Final   Klebsiella pneumoniae NOT DETECTED NOT DETECTED Final   Proteus species NOT DETECTED NOT DETECTED Final   Salmonella species NOT DETECTED NOT DETECTED Final   Serratia marcescens NOT DETECTED NOT DETECTED Final   Haemophilus influenzae NOT DETECTED NOT DETECTED Final   Neisseria meningitidis NOT DETECTED NOT DETECTED Final   Pseudomonas aeruginosa NOT DETECTED NOT DETECTED Final   Stenotrophomonas maltophilia NOT DETECTED NOT DETECTED Final   Candida albicans NOT DETECTED NOT DETECTED Final   Candida auris NOT DETECTED NOT DETECTED Final   Candida glabrata NOT DETECTED NOT DETECTED Final   Candida krusei NOT DETECTED NOT DETECTED Final   Candida parapsilosis NOT DETECTED NOT DETECTED Final   Candida tropicalis NOT DETECTED NOT DETECTED Final   Cryptococcus neoformans/gattii NOT DETECTED NOT DETECTED Final   Meth resistant mecA/C and MREJ NOT DETECTED NOT DETECTED Final    Comment: Performed at [facility], [address] [place], [place] [address]  Culture, blood (routine x 2)     Status: Abnormal   Collection Time: 03/29/23  5:29 PM   Specimen: BLOOD  Result Value Ref Range Status   Specimen Description  Final    BLOOD RFOA Performed at [facility], [address], [place], [place] [address]    Special Requests   Final    BOTTLES DRAWN AEROBIC AND ANAEROBIC Blood Culture results may not be optimal due to an inadequate volume of blood received in culture bottles Performed at [facility], [address] [place], [place] [address]    Culture  Setup Time   Final    GRAM POSITIVE COCCI ANAEROBIC BOTTLE ONLY Gram Stain Report Called to,Read Back By and Verified With: DOROTHA NED @MC  [id number], VIRAY,J Performed at [facility],  [address] [place], [place] [address]    Culture STAPHYLOCOCCUS AUREUS (A)  Final   Report Status 04/02/2023 FINAL  Final   Organism ID, Bacteria STAPHYLOCOCCUS AUREUS  Final      Susceptibility   Staphylococcus aureus - MIC*    CIPROFLOXACIN <=0.5 SENSITIVE Sensitive     ERYTHROMYCIN <=0.25 SENSITIVE Sensitive     GENTAMICIN <=0.5 SENSITIVE Sensitive     OXACILLIN 0.5 SENSITIVE Sensitive     TETRACYCLINE <=1 SENSITIVE Sensitive     VANCOMYCIN  1 SENSITIVE Sensitive     TRIMETH/SULFA <=10 SENSITIVE Sensitive     CLINDAMYCIN <=0.25 SENSITIVE Sensitive     RIFAMPIN <=0.5 SENSITIVE Sensitive     Inducible Clindamycin NEGATIVE Sensitive     LINEZOLID 2 SENSITIVE Sensitive     * STAPHYLOCOCCUS AUREUS  Culture, blood (routine x 2)     Status: None   Collection Time: 03/29/23  5:35 PM   Specimen: BLOOD LEFT FOREARM  Result Value Ref Range Status   Specimen Description BLOOD LEFT FOREARM  Final   Special Requests   Final    BOTTLES DRAWN AEROBIC ONLY Blood Culture results may not be optimal due to an inadequate volume of blood received in culture bottles   Culture   Final    NO GROWTH 5 DAYS Performed at [facility], [address], [place], [place] [address]    Report Status 04/03/2023 FINAL  Final  Culture, blood (Routine X 2) w Reflex to ID Panel     Status: None (Preliminary result)   Collection Time: 04/01/23  2:18 PM   Specimen: BLOOD LEFT HAND  Result Value Ref Range Status   Specimen Description BLOOD LEFT HAND  Final   Special Requests   Final    BOTTLES DRAWN AEROBIC AND ANAEROBIC Blood Culture results may not be optimal due to an inadequate volume of blood received in culture bottles   Culture   Final    NO GROWTH 3 DAYS Performed at [facility], [address] [place], [place] [address]    Report Status PENDING  Incomplete  Culture, blood (Routine X 2) w Reflex to ID Panel     Status: None (Preliminary result)   Collection Time: 04/01/23  2:18 PM   Specimen: BLOOD  RIGHT HAND  Result Value Ref Range Status   Specimen Description BLOOD RIGHT HAND  Final   Special Requests   Final    BOTTLES DRAWN AEROBIC AND ANAEROBIC Blood Culture results may not be optimal due to an inadequate volume of blood received in culture bottles   Culture   Final    NO GROWTH 3 DAYS Performed at [facility], [address], [place], [place] [address]    Report Status PENDING  Incomplete    Studies/Results: No results found.    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 female with MSSA bacteremia,pre-vertebral fluid collection from base of skull to T4 and T11-T12 epidural abscess with disktis/vertebral osteomyelitis   #1 MSSA bacteremia  --has been slow to clear and want to make sure she has NO GROWTH and FINAL on her 2nd repeat blood cultures prior to PICC and DC  Would treat her with 6 weeks of IV abx likely followed by po's given extent of her infection  #2 Fluid collection from skull to T4  --cefazolin  x 6 weeks followed by orals --would like repeat MRI in next 2 weeks  #3 T spine infection with epidural abscess  ' Re cefazolin   Would re-image T spine in 2-4 weeks  #4 MV pathology and persistently + blood cultures, raise concern for IE but cannot get TEE  #5 TDM; will want weekly CBC + diff and BMP w GFR   Debbie Goodwin has an appointment on  04/12/23 at 1015AM with Dr. Luiz at  [facility], which  is located in the [facility] at  [address] in  [place].  [address], which is located to the left of the elevators.  Phone: [phone]  Fax: [phone]  [email or web address]  The patient should arrive 30 minutes prior to their appoitment.     LOS: 6 days   Debbie Goodwin 04/04/2023, 2:32 PM

## 2023-04-04 NOTE — Progress Notes (Signed)
 PT Note:  Patient suffers from multiple spinal abscesses with nerve compression which impairs their ability to perform daily activities like ambulation in the home.  A walker alone will not resolve the issues with performing activities of daily living because she cannot tolerate household ambulation without her knees buckling.  A wheelchair will allow patient to safely perform daily activities.  The patient can self propel in the home or has a caregiver who can provide assistance.     Richerd Lipoma, PT  Acute Rehab Services Secure chat preferred Office [phone]

## 2023-04-04 NOTE — Plan of Care (Signed)

## 2023-04-04 NOTE — Progress Notes (Signed)
 PHARMACY CONSULT NOTE FOR:  OUTPATIENT  PARENTERAL ANTIBIOTIC THERAPY (OPAT)  Indication: MSSA epidural abscess Regimen: Cefazolin  2 g IV q8h End date: 05/13/23 (6 weeks from blood culture clearance 04/01/23)  IV antibiotic discharge orders are pended. To discharging provider:  please sign these orders via discharge navigator,  Select New Orders & click on the button choice - Manage This Unsigned Work.     Thank you for allowing pharmacy to be a part of this patient's care.  Con RAMAN Danzig Macgregor 04/06/2023, 8:55 AM

## 2023-04-04 NOTE — TOC Progression Note (Signed)
 Transition of Care [facility]) - Progression Note    Patient Details  Name: Debbie Goodwin MRN: [id number] Date of Birth: 1971-01-25  [facility]) CM/SW Contact  Tom-Johnson, Harvest Muskrat, RN Phone Number: 04/04/2023, 2:53 PM  Clinical Narrative:     CM consulted for Home Health PT and Home IV abx, Blood Cx grew MSSA. ID following. Neurosx following for Epidural Abscess of Thoracic Spine.  CM spoke with [facility] from [place] [phone]) and she accepted referral for home IV abx and RN.  CM called in PT referral and spoke with [facility] with [facility] and acceptance voiced, info on AVS. Plan for PICC placement. Wheelchair and BSC ordered from [place], Jermaine to deliver to patient.    CM will continue to follow as patient progresses with care towards discharge.              Barriers to Discharge: Continued Medical Work up  Expected Discharge Plan and Services                                               Social Determinants of Health (SDOH) Interventions SDOH Screenings   Food Insecurity: No Food Insecurity (03/30/2023)  Housing: Unknown (03/30/2023)  Transportation Needs: No Transportation Needs (03/30/2023)  Utilities: Not At Risk (03/30/2023)  Tobacco Use: Low Risk  (03/29/2023)    Readmission Risk Interventions    04/04/2023    2:53 PM  Readmission Risk Prevention Plan  Transportation Screening Complete  PCP or Specialist Appt within 5-7 Days Complete  Home Care Screening Complete  Medication Review (RN CM) Referral to Pharmacy

## 2023-04-04 NOTE — Progress Notes (Signed)
 HOSPITALIST                ROUNDING                 NOTE Debbie Goodwin FMW:[id number]  DOB: 04/07/1970  DOA: 03/29/2023  PCP: Bertell Satterfield, MD  04/04/2023,1:02 PM   LOS: 6 days      Code Status: Full code presumed From: Home  current Dispo: Likely home     53 year old white female Known colonic diverticuli followed by Dr. 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 Darlis affiliated with [place] [facility]  Patient came to [facility] ED after having 2 months of increasing weakness with ambulation difficulty getting around and instability on her hips and legs She had been finding it more difficult to move around and had pain radiating from around her thoracic spine around to her abdomen She presented with accidental fall with new  fever-- chest pain back pain 03/28/23 Tmax 102.6-COVID flu were negative CT head negative CT cervical spine prevertebral soft tissue swelling urine analysis unremarkable Toradol  given with improvement-MRI was done and showed fluid collection skull to T4 She was sent home with azithromycin  and amoxicillin  but called back because blood cultures grew MSSA in 2 bottles  workup CT = trace pleural effusions dependent opacities atelectasis versus pneumonia-hepatomegaly with steatosis?  Cirrhosis?  Small bowel thickening edema cervical prevertebral soft tissue fluid edema in the upper thorax MRI showed prevertebral effusion from skull base to T4 --13 mm at C3-C4-C7 ACDF without residual spinal canal neuroforaminal stenosis L-spine MRI posterior epidural collection T11-T12 suspicious for epidural abscess MRI thoracic spine posterior epidural  collection T11-12 with mass effect on posterior cord 18 x 7 cm  WBC 8.9 lactic acid 1.5 platelet 131  potassium 2.7 ESR 55 CRP 21 UA unremarkable for UTI  Infectious diseases telemetry consulted-patient sent to [facility] for TTE further workup 1/2 echocardiogram indeterminate diastolic parameters aortic and mitral valve no gross vegetation 1/3 neurosurgery consulted 1/3 ENT consulted  Plan  MSSA back infection BC X2 on 12/31, 1/1 blood culture still has staph-has 2 possible separate sitesT11-T12 epidural abscess mass effect 18X 7 mm--- skull base to T4 fluid 13 mmc ollection in setting ACDF NeuroS Dr. Mavis has seen patient -recommends nonoperative management, has no neurovascular compromise repeat MRI in 4 weeks-- will need 6 weeks total therapy--- wait to place PICC until cultures finalized-TEE not necessary per ID  Acute pain from Fall--I think the fall causing majority of her pain-she is overall better  infection superimposed on chronic low back pain [Home regimen in 04/02/2023 note] Better controlled pain: Oxy IR 15 every 6 as needed Naprosyn  500 twice daily Flexeril  10 3 times daily as needed Neurontin  900 daily lidocaine  patch every 12  Can use IV morphine  2 mg every 2 as needed severe pain-required a little bit yesterday  HTN Continues on avapro  37.5 hydralazine  5 every 4 as needed  Blood pressure moderately controlled  Depression Continues on Xanax  0.25 x 3 times daily, amitriptyline  25 -50 at bedtime, bupropion  XL 300, Cymbalta  60 Recommend outpatient de-escalation of meds  Class IV obesity BMI >48 DM TY 2 Resume metformin  1000 twice daily---hold phentermine-use sliding scale coverage moderate with at bedtime--- CBG ranging 90-130  Resume nighttime CPAP as per orders  Reflux GERD as well as prior hiatal hernia and Schatzki ring Continue Pepcid  20 daily, Protonix  40 daily  DVT prophylaxis: SCD  Status is: Inpatient Remains inpatient appropriate because:   Requires  further workup   Subjective:  Overall pain better no distress looks fair No fever   Objective + exam Vitals:   04/03/23 2048 04/03/23 2221 04/04/23 0528 04/04/23 0706  BP: 133/78  126/76 131/68  Pulse: 89 88 87 78  Resp:  18  18  Temp: 98 F (36.7 C)  98.4 F (36.9 C) 98 F (36.7 C)  TempSrc: Oral  Oral Oral  SpO2: 100% 100% 98% 95%  Weight:      Height:       Filed Weights   03/29/23 1605  Weight: 125.2 kg    Examination:  No icterus no pallor coherent awake alert no distress looks well feels fair no fever no chills no nausea ROM intact CTAB no added sound no wheeze rales rhonchi Power 5/5 less foot drop on left--able to move around a little bit more independently without as much pain  Data Reviewed: reviewed   CBC    Component Value Date/Time   WBC 11.2 (H) 04/04/2023 0335   RBC 3.17 (L) 04/04/2023 0335   HGB 9.2 (L) 04/04/2023 0335   HCT 28.8 (L) 04/04/2023 0335   PLT 273 04/04/2023 0335   MCV 90.9 04/04/2023 0335   MCH 29.0 04/04/2023 0335   MCHC 31.9 04/04/2023 0335   RDW 13.7 04/04/2023 0335   LYMPHSABS 3.3 04/04/2023 0335   MONOABS 0.8 04/04/2023 0335   EOSABS 0.2 04/04/2023 0335   BASOSABS 0.1 04/04/2023 0335      Latest Ref Rng & Units 04/04/2023    3:35 AM 04/03/2023    3:32 AM 04/01/2023    8:11 AM  CMP  Glucose 70 - 99 mg/dL 880  877  855   BUN 6 - 20 mg/dL 6  6  8    Creatinine 0.44 - 1.00 mg/dL 9.28  9.34  9.28   Sodium 135 - 145 mmol/L 141  138  132   Potassium 3.5 - 5.1 mmol/L 3.6  3.5  3.5   Chloride 98 - 111 mmol/L 102  98  95   CO2 22 - 32 mmol/L 27  28  23    Calcium 8.9 - 10.3 mg/dL 8.7  8.6  8.4   Total Protein 6.5 - 8.1 g/dL   6.9   Total Bilirubin 0.0 - 1.2 mg/dL   1.1   Alkaline Phos 38 - 126 U/L   94   AST 15 - 41 U/L   36   ALT 0 - 44 U/L   37     Scheduled Meds:  amitriptyline   25-50 mg Oral QHS   buPROPion   300 mg Oral Daily   DULoxetine   60 mg Oral Daily   famotidine   20 mg Oral QHS   gabapentin   900 mg Oral Daily    insulin  aspart  0-15 Units Subcutaneous TID WC   insulin  aspart  0-5 Units Subcutaneous QHS   irbesartan   37.5 mg Oral Daily   levothyroxine   100 mcg Oral Q0600   lidocaine   1 patch Transdermal Q24H   metFORMIN   1,000 mg Oral BID WC   naproxen   500 mg Oral BID WC   pantoprazole   40 mg Oral Daily   rOPINIRole   3 mg Oral Daily   simvastatin   40 mg Oral q1800  Continuous Infusions:   ceFAZolin  (ANCEF ) IV 2 g (04/04/23 9371)    Time 20  Jai-Gurmukh Maxine Huynh, MD  [facility]

## 2023-04-04 NOTE — Progress Notes (Signed)
 Physical Therapy Treatment Patient Details Name: Debbie Goodwin MRN: [id number] DOB: 02/07/1971 Today's Date: 04/04/2023   History of Present Illness 53 y.o. female  presents to ED 03/29/2023 for positive blood cultures from ED visit 12/31 secondary to complaints of a fever. MRI showed multiple spinal abscesses.  PMH obesity, hypertension, fibromyalgia, hyperlipidemia, diabetes mellitus, chronic cervical radiculopathy, status post decompression surgery by Dr. Mavis in 2022,  lumbar steroid injection every 71-month by Dr. Darlis, most recent in September 2024.    PT Comments  Pt feeling better with bed mobility and in sitting. However, as soon as she stands she experiences radicular pain B hips into LE's as well as weakness with LE shaking. Pt tolerated 10' ambulation with RW and min A +2 for safety, knees beginning to buckle after that distance and pain increasing to 10/10. Had a long discussion about home vs facility and pt prefers to go home. Will need a w/c to be able to get into home and through longer hallways. She has a RW to use for transfers. Recommend HHPT. PT will continue to follow.     If plan is discharge home, recommend the following: Two people to help with walking and/or transfers;Assistance with cooking/housework;Assist for transportation;Help with stairs or ramp for entrance   Can travel by private vehicle        Equipment Recommendations  Wheelchair (measurements PT)    Recommendations for Other Services OT consult     Precautions / Restrictions Precautions Precautions: Fall Precaution Comments: reports 2 falls PTA, back precautions for comfort Restrictions Weight Bearing Restrictions Per Provider Order: No     Mobility  Bed Mobility Overal bed mobility: Modified Independent             General bed mobility comments: supine to sit with rail and no assist    Transfers Overall transfer level: Needs assistance Equipment used: Rolling walker (2  wheels) Transfers: Sit to/from Stand Sit to Stand: Min assist           General transfer comment: min A for safety. Pt's LE's begin to shake as soon as she has pressure on LE's    Ambulation/Gait Ambulation/Gait assistance: Min assist, +2 safety/equipment Gait Distance (Feet): 10 Feet Assistive device: Rolling walker (2 wheels) Gait Pattern/deviations: Step-through pattern, Decreased stride length Gait velocity: decreased Gait velocity interpretation: <1.31 ft/sec, indicative of household ambulator   General Gait Details: pt instructed to push through RW to take wt off back. LE shaking worsened esp last 5'. +2 for safety as pt's knees beginning to buckle as she approached return to bed   Stairs             Wheelchair Mobility     Tilt Bed    Modified Rankin (Stroke Patients Only)       Balance Overall balance assessment: Needs assistance Sitting-balance support: No upper extremity supported, Feet supported Sitting balance-Leahy Scale: Good     Standing balance support: No upper extremity supported, During functional activity Standing balance-Leahy Scale: Poor Standing balance comment: pt cannot maintain standing without UE support and even then tolerance it very poor due to pain and neurological weakness                            Cognition Arousal: Alert Behavior During Therapy: WFL for tasks assessed/performed Overall Cognitive Status: Within Functional Limits for tasks assessed  General Comments: pt is a respiratory therapist, 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        Exercises      General Comments General comments (skin integrity, edema, etc.): pain is tolerable in sitting but as soon as she gets into standing gets radicular pain BLE's as well as weakness      Pertinent Vitals/Pain Pain Assessment Pain Assessment: Faces Faces Pain Scale: Hurts whole lot Pain Location: back into B hips in  standing Pain Descriptors / Indicators: Discomfort, Sore, Stabbing Pain Intervention(s): Limited activity within patient's tolerance, Monitored during session, Repositioned    Home Living                          Prior Function            PT Goals (current goals can now be found in the care plan section) Acute Rehab PT Goals Patient Stated Goal: decr pain and return to independent PT Goal Formulation: With patient Time For Goal Achievement: 04/16/23 Potential to Achieve Goals: Good Progress towards PT goals: Progressing toward goals    Frequency    Min 1X/week      PT Plan      Co-evaluation              AM-PAC PT 6 Clicks Mobility   Outcome Measure  Help needed turning from your back to your side while in a flat bed without using bedrails?: None Help needed moving from lying on your back to sitting on the side of a flat bed without using bedrails?: None Help needed moving to and from a bed to a chair (including a wheelchair)?: A Lot Help needed standing up from a chair using your arms (e.g., wheelchair or bedside chair)?: A Lot Help needed to walk in hospital room?: Total Help needed climbing 3-5 steps with a railing? : Total 6 Click Score: 14    End of Session   Activity Tolerance: Patient limited by pain Patient left: in bed;with call bell/phone within reach Nurse Communication: Mobility status PT Visit Diagnosis: Unsteadiness on feet (R26.81);Repeated falls (R29.6);Muscle weakness (generalized) (M62.81);Pain Pain - Right/Left:  (bil) Pain - part of body: Leg (lower torso)     Time: 8895-8870 PT Time Calculation (min) (ACUTE ONLY): 25 min  Charges:    $Gait Training: 8-22 mins $Therapeutic Activity: 8-22 mins PT General Charges $$ ACUTE PT VISIT: 1 Visit                     Richerd Lipoma, PT  Acute Rehab Services Secure chat preferred Office [phone]    Richerd CROME Tru Leopard 04/04/2023, 12:14 PM

## 2023-04-04 NOTE — Progress Notes (Signed)
   04/04/23 2300  BiPAP/CPAP/SIPAP  BiPAP/CPAP/SIPAP Pt Type Adult  BiPAP/CPAP/SIPAP Resmed  FiO2 (%) 21 %  Patient Home Equipment Yes  Safety Check Completed by RT for Home Unit Yes, no issues noted   Pt placed self on home cpap unit.

## 2023-04-04 NOTE — Progress Notes (Signed)
 Subjective: The patient is alert and pleasant.  She looks and feels better.  Objective: Vital signs in last 24 hours: Temp:  [98 F (36.7 C)-98.4 F (36.9 C)] 98 F (36.7 C) (01/07 0706) Pulse Rate:  [78-89] 78 (01/07 0706) Resp:  [18] 18 (01/07 0706) BP: (111-133)/(57-78) 131/68 (01/07 0706) SpO2:  [95 %-100 %] 95 % (01/07 0706) FiO2 (%):  [21 %] 21 % (01/06 2221) Estimated body mass index is 48.89 kg/m as calculated from the following:   Height as of this encounter: 5' 3 (1.6 m).   Weight as of this encounter: 125.2 kg.   Intake/Output from previous day: 01/06 0701 - 01/07 0700 In: 240 [P.O.:240] Out: 0  Intake/Output this shift: No intake/output data recorded.  Physical exam the patient is alert and oriented.  Her lower extremity strength and sensation is normal.  Lab Results: Recent Labs    04/03/23 0332 04/04/23 0335  WBC 13.8* 11.2*  HGB 9.8* 9.2*  HCT 30.0* 28.8*  PLT 258 273   BMET Recent Labs    04/03/23 0332 04/04/23 0335  NA 138 141  K 3.5 3.6  CL 98 102  CO2 28 27  GLUCOSE 122* 119*  BUN 6 6  CREATININE 0.65 0.71  CALCIUM 8.6* 8.7*    Studies/Results: No results found.  Assessment/Plan: Thoracic epidural abscess: She is responding to antibiotics.  The plan is for prolonged IV antibiotics, PICC line, etc.  I spoke with Dr.Samtani.  Please have her follow-up with me in the office in a couple weeks and follow-up with ID as well.  I will sign off.  Please call if I can be of further assistance.  LOS: 6 days     Debbie Goodwin 04/04/2023, 7:56 AM     Patient ID: Debbie Goodwin, female   DOB: 1971-03-24, 53 y.o.   MRN: [id number]

## 2023-04-04 NOTE — Progress Notes (Cosign Needed)
    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  A crutch will not resolve  issue with performing activities of daily living. A wheelchair will allow patient to safely perform daily activities.Length of need 12 months . (THEN ONE OF THESE TWO:) Patient requires a size which is not available in a standard or lightweight wheelchair and patient spends at least two hours per day in their chair. Accessories: elevating leg rests (ELRs), wheel locks, extensions and anti-tippers.   04/04/23 1452

## 2023-04-05 DIAGNOSIS — G061 Intraspinal abscess and granuloma: Secondary | ICD-10-CM | POA: Diagnosis not present

## 2023-04-05 LAB — RENAL FUNCTION PANEL
Albumin: 2.9 g/dL — ABNORMAL LOW (ref 3.5–5.0)
Anion gap: 12 (ref 5–15)
BUN: 7 mg/dL (ref 6–20)
CO2: 28 mmol/L (ref 22–32)
Calcium: 8.9 mg/dL (ref 8.9–10.3)
Chloride: 99 mmol/L (ref 98–111)
Creatinine, Ser: 0.81 mg/dL (ref 0.44–1.00)
GFR, Estimated: >60 mL/min (ref >60)
Glucose, Bld: 120 mg/dL — ABNORMAL HIGH (ref 70–99)
Phosphorus: 4.1 mg/dL (ref 2.5–4.6)
Potassium: 3.6 mmol/L (ref 3.5–5.1)
Sodium: 139 mmol/L (ref 135–145)

## 2023-04-05 LAB — CBC WITH DIFFERENTIAL/PLATELET
Abs Immature Granulocytes: 0.14 10*3/uL — ABNORMAL HIGH (ref 0.00–0.07)
Basophils Absolute: 0.1 10*3/uL (ref 0.0–0.1)
Basophils Relative: 1 %
Eosinophils Absolute: 0.2 10*3/uL (ref 0.0–0.5)
Eosinophils Relative: 1 %
HCT: 31.9 % — ABNORMAL LOW (ref 36.0–46.0)
Hemoglobin: 10 g/dL — ABNORMAL LOW (ref 12.0–15.0)
Immature Granulocytes: 1 %
Lymphocytes Relative: 31 %
Lymphs Abs: 3.5 10*3/uL (ref 0.7–4.0)
MCH: 29.1 pg (ref 26.0–34.0)
MCHC: 31.3 g/dL (ref 30.0–36.0)
MCV: 92.7 fL (ref 80.0–100.0)
Monocytes Absolute: 0.8 10*3/uL (ref 0.1–1.0)
Monocytes Relative: 7 %
Neutro Abs: 6.5 10*3/uL (ref 1.7–7.7)
Neutrophils Relative %: 59 %
Platelets: 353 10*3/uL (ref 150–400)
RBC: 3.44 MIL/uL — ABNORMAL LOW (ref 3.87–5.11)
RDW: 13.8 % (ref 11.5–15.5)
WBC: 11.1 10*3/uL — ABNORMAL HIGH (ref 4.0–10.5)
nRBC: 0 % (ref 0.0–0.2)

## 2023-04-05 LAB — GLUCOSE, CAPILLARY
Glucose-Capillary: 125 mg/dL — ABNORMAL HIGH (ref 70–99)
Glucose-Capillary: 106 mg/dL — ABNORMAL HIGH (ref 70–99)
Glucose-Capillary: 112 mg/dL — ABNORMAL HIGH (ref 70–99)
Glucose-Capillary: 129 mg/dL — ABNORMAL HIGH (ref 70–99)

## 2023-04-05 MED ORDER — MELATONIN 5 MG PO TABS
5.0000 mg | ORAL_TABLET | Freq: Every evening | ORAL | Status: DC | PRN
  Administered 2023-04-05: 5 mg via ORAL
  Filled 2023-04-05: qty 1

## 2023-04-05 MED ORDER — ROPINIROLE HCL 1 MG PO TABS
1.0000 mg | ORAL_TABLET | Freq: Every day | ORAL | Status: DC | PRN
  Administered 2023-04-05: 1 mg via ORAL
  Filled 2023-04-05: qty 1

## 2023-04-05 NOTE — Progress Notes (Signed)
 Occupational Therapy Treatment Patient Details Name: Debbie Goodwin MRN: [id number] DOB: [birth year] Today's Date: 04/05/2023   History of present illness 53 y.o. female  presents to ED 03/29/2023 for positive blood cultures from ED visit 12/31 secondary to complaints of a fever. MRI showed multiple spinal abscesses.  PMH obesity, hypertension, fibromyalgia, hyperlipidemia, diabetes mellitus, chronic cervical radiculopathy, status post decompression surgery by Dr. Mavis in 2022,  lumbar steroid injection every 61-month by Dr. Darlis, most recent in September 2024.   OT comments  Pt making continued progress towards OT goals though remains limited by pain and LE weakness. Focused session on self monitoring pain increases/LE shakiness and implementing seated rest breaks during ADLs as needed. Provided handouts for energy conservation and fall prevention w/ continued collaboration with pt re: DME needs, home setup and safety precautions. Continue to rec [facility]OT at [facility] and [facility].      If plan is discharge home, recommend the following:  A little help with walking and/or transfers;A little help with bathing/dressing/bathroom;Assistance with cooking/housework;Help with stairs or ramp for entrance   Equipment Recommendations  BSC/3in1    Recommendations for Other Services      Precautions / Restrictions Precautions Precautions: Fall Precaution Comments: reports 2 falls PTA, back precautions for comfort Restrictions Weight Bearing Restrictions Per Provider Order: No       Mobility Bed Mobility Overal bed mobility: Modified Independent                  Transfers Overall transfer level: Needs assistance Equipment used: Rolling walker (2 wheels) Transfers: Sit to/from Stand Sit to Stand: Supervision                 Balance Overall balance assessment: Needs assistance Sitting-balance support: No upper extremity supported, Feet supported Sitting balance-Leahy Scale: Good      Standing balance support: No upper extremity supported, During functional activity, Bilateral upper extremity supported Standing balance-Leahy Scale: Fair                             ADL either performed or assessed with clinical judgement   ADL Overall ADL's : Needs assistance/impaired     Grooming: Supervision/safety;Standing;Brushing hair;Wash/dry face Grooming Details (indicate cue type and reason): standing at sink reaching overhead to brush hair and put up in clip. no LOB or shakiness in knees. placed chair behind pt, cued to listen to body and sit when pain/shakiness increased                             Functional mobility during ADLs: Contact guard assist;Rolling walker (2 wheels) General ADL Comments: Further discussions on DME uses, fall prevention at home and utilizing chairs for seated rest breaks when needed with ADLs/mobility around the home. Rearranged room to allow more clear path for bathroom mobility which pt has been completing without assist since admission    Extremity/Trunk Assessment Upper Extremity Assessment Upper Extremity Assessment: Overall WFL for tasks assessed;Right hand dominant   Lower Extremity Assessment Lower Extremity Assessment: Defer to PT evaluation        Vision   Vision Assessment?: No apparent visual deficits   Perception     Praxis      Cognition Arousal: Alert Behavior During Therapy: WFL for tasks assessed/performed Overall Cognitive Status: Within Functional Limits for tasks assessed  Exercises      Shoulder Instructions       General Comments      Pertinent Vitals/ Pain       Pain Assessment Pain Assessment: Faces Faces Pain Scale: Hurts little more Pain Location: B hips Pain Descriptors / Indicators: Grimacing, Guarding, Sore Pain Intervention(s): Monitored during session, Limited activity within patient's tolerance,  Repositioned  Home Living                                          Prior Functioning/Environment              Frequency  Min 1X/week        Progress Toward Goals  OT Goals(current goals can now be found in the care plan section)  Progress towards OT goals: Progressing toward goals  Acute Rehab OT Goals Patient Stated Goal: pain control and LE strength recovery OT Goal Formulation: With patient Time For Goal Achievement: 04/17/23 Potential to Achieve Goals: Good ADL Goals Pt Will Perform Lower Body Dressing: with modified independence;sit to/from stand;sitting/lateral leans;with adaptive equipment Pt Will Transfer to Toilet: with modified independence;ambulating Additional ADL Goal #1: Pt to increase standing tolerance > 8 min during ADLs/mobility without seated rest break or safety concerns  Plan      Co-evaluation                 AM-PAC OT 6 Clicks Daily Activity     Outcome Measure   Help from another person eating meals?: None Help from another person taking care of personal grooming?: A Little Help from another person toileting, which includes using toliet, bedpan, or urinal?: A Little Help from another person bathing (including washing, rinsing, drying)?: A Little Help from another person to put on and taking off regular upper body clothing?: A Little Help from another person to put on and taking off regular lower body clothing?: A Little 6 Click Score: 19    End of Session Equipment Utilized During Treatment: Rolling walker (2 wheels);Gait belt  OT Visit Diagnosis: Unsteadiness on feet (R26.81);Muscle weakness (generalized) (M62.81)   Activity Tolerance Patient tolerated treatment well   Patient Left in chair;with call bell/phone within reach   Nurse Communication Mobility status        Time: 9094-9064 OT Time Calculation (min): 30 min  Charges: OT General Charges $OT Visit: 1 Visit OT Treatments $Self Care/Home  Management : 8-22 mins $Therapeutic Activity: 8-22 mins  Debbie Goodwin, OTR/L Acute Rehab Services Office: [phone]   Debbie Getting 04/05/2023, 9:48 AM

## 2023-04-05 NOTE — Progress Notes (Signed)
 PROGRESS NOTE  Debbie Goodwin  FMW:[id number] DOB: [birth year] DOA: 03/29/2023 PCP: Bertell Satterfield, MD   Brief Narrative: Patient is a 53 year old female with history of obesity, colonic diverticulosis, GERD/reflux disease: Depression, hypertension, chronic cervical radiculopathy status post decompression surgery in 2022, back pain undergoing regular lumbar steroid injections who presented with 2 weeks history of weakness, instability of hips/legs, fall, fever.  On presentation she had high-grade fever.  CT head negative for acute findings.  CT cervical spine showed.  Vertebral soft tissue swelling.  MRI cervical spine showed Prevertebral effusion extending from the skull base inferiorly to at least T4.  MRI thoracic spine showed  posterior epidural collection at T11-12 with mass effect on the posterior cord, consistent with abscess underlying right facet arthritis at T11-12, presumably septic, with myositis and smaller collection posterior to the facet as well.  ID, neurosurgery consulted.  Plan for nonoperative management with antibiotics.  Blood cultures showed MSSA.  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  MRI findings above.Neurosurgery consulted.  Plan for nonoperative management with antibiotics.  Blood cultures showed MSSA.  Awaiting IDs recommendation for PICC line placement/outpatient antibiotic therapy.  Repeat blood cultures have been negative so far for last 4  days.  Continue pain management, supportive care. She will follow-up with neurosurgery, ID as an outpatient.  Hypertension: Currently blood stable.  Continue ARB  Depression: On Xanax , amitriptyline , bupropion , Cymbalta   GERD: Continue PPI  Diabetes type 2: On metformin  at home.  Monitor blood sugars.  OSA: On CPAP at night  Obesity: BMI 48.8  Cirrhosis: CT abdomen/pelvis showed hepatomegaly with hepatic steatosis. Nodular contours suspicious for cirrhosis. Splenomegaly.  We recommend to follow-up with hematology/GI as an outpatient        DVT prophylaxis:SCDs Start: 03/29/23 2004     Code Status: Full Code  Family Communication: Family member at bedside  Patient status:Inpatient  Patient is from :home  Anticipated discharge un:ynfz  Estimated DC date:after ID clearance and recommendation for antibiotics.Needs to have picc line first   Consultants: ID, neurosurgery  Procedures: None  Antimicrobials:  Anti-infectives (From admission, onward)    Start     Dose/Rate Route Frequency Ordered Stop   04/01/23 0945  fluconazole  (DIFLUCAN ) tablet 150 mg        150 mg Oral  Once 04/01/23 0849 04/01/23 1011   03/30/23 1000  cefTRIAXone  (ROCEPHIN ) 2 g in sodium chloride  0.9 % 100 mL IVPB  Status:  Discontinued        2 g 200 mL/hr over 30 Minutes Intravenous Every 24 hours 03/29/23 1950 03/30/23 0746   03/30/23 1000  ceFAZolin  (ANCEF ) IVPB 2g/100 mL premix        2 g 200 mL/hr over 30 Minutes Intravenous Every 8 hours 03/30/23 0834     03/30/23 0600  vancomycin  (VANCOCIN ) IVPB 1000 mg/200 mL premix  Status:  Discontinued        1,000 mg 200 mL/hr over 60 Minutes Intravenous Every 12 hours 03/29/23 2021 03/30/23 0746   03/29/23 1715  vancomycin  (VANCOCIN ) IVPB 1000 mg/200 mL premix        1,000 mg 200 mL/hr over 60 Minutes Intravenous  Once 03/29/23 1708 03/29/23 1854   03/29/23 1715  ceFEPIme  (MAXIPIME ) 2 g in sodium chloride  0.9 % 100 mL IVPB        2 g 200 mL/hr  over 30 Minutes Intravenous  Once 03/29/23 1708 03/29/23 1829       Subjective:  Patient seen and examined at bedside today.  Hemodynamically stable.  Comfortably sitting in the bed.  Denies any complaints.  No back pain at the moment.  Eager to go home  Objective: Vitals:   04/04/23 1634 04/04/23 2043 04/05/23 0450 04/05/23 0902  BP: (!) 133/54 118/67 128/69 127/60  Pulse: 82 79 87 92  Resp: 18   18  Temp: 98.5 F (36.9 C) 98.5 F (36.9 C) 98.2 F (36.8 C)   TempSrc: Oral Oral Oral   SpO2: 100% 99% 100% 99%  Weight:      Height:        Intake/Output Summary (Last 24 hours) at 04/05/2023 1320 Last data filed at 04/05/2023 0504 Gross per 24 hour  Intake 240 ml  Output 0 ml  Net 240 ml   Filed Weights   03/29/23 1605  Weight: 125.2 kg    Examination:  General exam: Comfortable, not in distress,morbidly obese HEENT: PERRL Respiratory system:  no wheezes or crackles  Cardiovascular system: S1 & S2 heard, RRR.  Gastrointestinal system: Abdomen is nondistended, soft and nontender. Central nervous system: Alert and oriented Extremities: No edema, no clubbing ,no cyanosis Skin: No rashes, no ulcers,no icterus     Data Reviewed: I have personally reviewed following labs and imaging studies  CBC: Recent Labs  Lab 03/29/23 1729 03/30/23 0508 04/01/23 0811 04/03/23 0332 04/04/23 0335 04/05/23 0341  WBC 8.9 8.3 12.5* 13.8* 11.2* 11.1*  NEUTROABS 6.2  --  8.0* 9.0* 6.7 6.5  HGB 10.5* 10.3* 10.1* 9.8* 9.2* 10.0*  HCT 31.6* 32.3* 30.9* 30.0* 28.8* 31.9*  MCV 88.3 91.5 88.8 91.2 90.9 92.7  PLT 131* 118* 212 258 273 353   Basic Metabolic Panel: Recent Labs  Lab 03/29/23 1729 03/30/23 0508 03/31/23 0657 04/01/23 0811 04/03/23 0332 04/04/23 0335 04/05/23 0341  NA 131*   < > 131* 132* 138 141 139  K 2.7*   < > 3.5 3.5 3.5 3.6 3.6  CL 97*   < > 95* 95* 98 102 99  CO2 22   < > 22 23 28 27 28   GLUCOSE 193*   < > 283* 144* 122* 119* 120*  BUN 12   < > 9 8 6 6 7    CREATININE 0.57   < > 0.84 0.71 0.65 0.71 0.81  CALCIUM 8.0*   < > 8.0* 8.4* 8.6* 8.7* 8.9  MG 1.6*  --  1.6*  --   --   --   --   PHOS  --   --   --   --  3.5 4.1 4.1   < > = values in this interval not displayed.     Recent Results (from the past 240 hours)  Resp panel by RT-PCR (RSV, Flu A&B, Covid) Anterior Nasal Swab     Status: None   Collection Time: 03/28/23  6:31 PM   Specimen: Anterior Nasal Swab  Result Value Ref Range Status   SARS Coronavirus 2 by RT PCR NEGATIVE NEGATIVE Final    Comment: (NOTE) SARS-CoV-2 target nucleic acids are  NOT DETECTED.  The SARS-CoV-2 RNA is generally detectable in upper respiratory specimens during the acute phase of infection. The lowest concentration of SARS-CoV-2 viral copies this assay can detect is 138 copies/mL. A negative result does not preclude SARS-Cov-2 infection and should not be used as the sole basis for treatment or other patient management decisions. A negative result may occur with  improper specimen collection/handling, submission of specimen other than nasopharyngeal swab, presence of viral mutation(s) within the areas targeted by this assay, and inadequate number of viral copies(<138 copies/mL). A negative result must be combined with clinical observations, patient history, and epidemiological information. The expected result is Negative.  Fact Sheet for Patients:  bloggercourse.com  Fact Sheet for Healthcare Providers:  seriousbroker.it  This test is no t yet approved or cleared by the [place]  FDA and  has been authorized for detection and/or diagnosis of SARS-CoV-2 by FDA under an Emergency Use Authorization (EUA). This EUA will remain  in effect (meaning this test can be used) for the duration of the COVID-19 declaration under Section 564(b)(1) of the Act, 21 U.S.C.section 360bbb-3(b)(1), unless the authorization is terminated  or revoked sooner.        Influenza A by PCR NEGATIVE NEGATIVE Final   Influenza B by PCR NEGATIVE NEGATIVE Final    Comment: (NOTE) The Xpert Xpress SARS-CoV-2/FLU/RSV plus assay is intended as an aid in the diagnosis of influenza from Nasopharyngeal swab specimens and should not be used as a sole basis for treatment. Nasal washings and aspirates are unacceptable for Xpert Xpress SARS-CoV-2/FLU/RSV testing.  Fact Sheet for Patients: bloggercourse.com  Fact Sheet for Healthcare Providers: seriousbroker.it  This test is not yet approved or cleared by the [place]  FDA and has been authorized for detection and/or diagnosis of SARS-CoV-2 by FDA under an Emergency Use Authorization (EUA). This EUA will remain in effect (meaning this test can be used) for the duration of the COVID-19 declaration under Section 564(b)(1) of the Act, 21 U.S.C. section 360bbb-3(b)(1), unless the authorization is terminated or revoked.     Resp Syncytial Virus by PCR NEGATIVE NEGATIVE Final    Comment: (NOTE) Fact Sheet for Patients: bloggercourse.com  Fact Sheet for Healthcare Providers: seriousbroker.it  This test is not yet approved or cleared by the [place]  FDA and has been authorized for detection and/or diagnosis of SARS-CoV-2 by FDA under an Emergency Use Authorization (EUA). This EUA will remain in effect (meaning this test can be used) for the duration of the COVID-19 declaration under Section 564(b)(1) of the Act, 21 U.S.C. section 360bbb-3(b)(1), unless the authorization is terminated or revoked.  Performed at [facility], [address] [place], [place] [address]   Blood culture (routine x 2)     Status: Abnormal   Collection Time: 03/28/23  8:17 PM   Specimen: Right Antecubital; Blood  Result Value Ref Range Status   Specimen Description   Final    RIGHT ANTECUBITAL Performed at [facility],  [address] [place], [place] [address]    Special Requests   Final    BOTTLES DRAWN AEROBIC AND ANAEROBIC Blood Culture results may not be optimal due to an inadequate volume of blood received in culture bottles Performed at [facility], [address] [place], [place] [address]    Culture  Setup Time   Final    AEROBIC BOTTLE ONLY GRAM POSITIVE COCCI Gram Stain Report Called to,Read Back By and Verified With: EMILY GANT @ 1053 ON 03/29/23 C VARNER GRAM STAIN REVIEWED-AGREE  WITH RESULT DRT    Culture (A)  Final    STAPHYLOCOCCUS AUREUS SUSCEPTIBILITIES PERFORMED ON PREVIOUS CULTURE WITHIN THE LAST 5 DAYS. Performed at [facility], [address] [place], [place] [address]    Report Status 03/31/2023 FINAL  Final  Blood culture (routine x 2)     Status: Abnormal   Collection Time: 03/28/23  8:53 PM   Specimen: BLOOD LEFT HAND  Result Value Ref Range Status   Specimen Description   Final    BLOOD LEFT HAND Performed at [facility], [address], [place], [place] [address]    Special Requests   Final    BOTTLES DRAWN AEROBIC AND ANAEROBIC Blood Culture adequate volume Performed at [facility], [address], [place], [place] [address]    Culture  Setup Time   Final    GRAM POSITIVE COCCI IN BOTH AEROBIC AND ANAEROBIC BOTTLES Gram Stain Report Called to,Read Back By and Verified With: DOSS M @ 1300 ON [id number] BY HENDERSON L GRAM STAIN REVIEWED-AGREE WITH RESULT DRT CRITICAL RESULT CALLED TO, READ BACK BY AND VERIFIED WITH: RN CHRISTY EDWARDS ON 03/29/23 @ 1916 BY DRT Performed at [facility], [address] [place], [place] [address]    Culture STAPHYLOCOCCUS AUREUS (A)  Final   Report Status 03/31/2023 FINAL  Final   Organism ID, Bacteria STAPHYLOCOCCUS AUREUS  Final      Susceptibility   Staphylococcus aureus - MIC*    CIPROFLOXACIN <=0.5 SENSITIVE Sensitive     ERYTHROMYCIN <=0.25 SENSITIVE Sensitive     GENTAMICIN <=0.5 SENSITIVE Sensitive     OXACILLIN 0.5  SENSITIVE Sensitive     TETRACYCLINE <=1 SENSITIVE Sensitive     VANCOMYCIN  1 SENSITIVE Sensitive     TRIMETH/SULFA <=10 SENSITIVE Sensitive     CLINDAMYCIN <=0.25 SENSITIVE Sensitive     RIFAMPIN <=0.5 SENSITIVE Sensitive     Inducible Clindamycin NEGATIVE Sensitive     LINEZOLID 2 SENSITIVE Sensitive     * STAPHYLOCOCCUS AUREUS  Blood Culture ID Panel (Reflexed)     Status: Abnormal   Collection Time: 03/28/23  8:53 PM  Result Value Ref Range Status   Enterococcus faecalis NOT DETECTED NOT DETECTED Final   Enterococcus Faecium NOT DETECTED NOT DETECTED Final   Listeria monocytogenes NOT DETECTED NOT DETECTED Final   Staphylococcus species DETECTED (A) NOT DETECTED Final    Comment: CRITICAL RESULT CALLED TO, READ BACK BY AND VERIFIED WITH: RN CHRISTY EDWARDS ON 03/29/23 @ 1916 BY DRT    Staphylococcus aureus (BCID) DETECTED (A) NOT DETECTED Final    Comment: CRITICAL RESULT CALLED TO, READ BACK BY AND VERIFIED WITH: RN CHRISTY EDWARDS ON 03/29/23 @ 1916 BY DRT    Staphylococcus epidermidis NOT DETECTED NOT DETECTED Final   Staphylococcus lugdunensis NOT DETECTED NOT DETECTED Final   Streptococcus species NOT DETECTED NOT DETECTED Final   Streptococcus agalactiae NOT DETECTED NOT DETECTED Final   Streptococcus pneumoniae NOT DETECTED NOT DETECTED Final   Streptococcus pyogenes NOT DETECTED NOT DETECTED Final   A.calcoaceticus-baumannii NOT DETECTED NOT DETECTED Final   Bacteroides fragilis NOT DETECTED NOT DETECTED Final   Enterobacterales NOT DETECTED NOT DETECTED Final   Enterobacter cloacae complex NOT DETECTED NOT DETECTED Final   Escherichia coli NOT DETECTED NOT DETECTED Final   Klebsiella aerogenes NOT DETECTED NOT DETECTED Final   Klebsiella oxytoca NOT DETECTED NOT DETECTED Final   Klebsiella pneumoniae NOT DETECTED NOT DETECTED Final   Proteus species NOT DETECTED NOT DETECTED Final  Salmonella species NOT DETECTED NOT DETECTED Final   Serratia marcescens NOT DETECTED  NOT DETECTED Final   Haemophilus influenzae NOT DETECTED NOT DETECTED Final   Neisseria meningitidis NOT DETECTED NOT DETECTED Final   Pseudomonas aeruginosa NOT DETECTED NOT DETECTED Final   Stenotrophomonas maltophilia NOT DETECTED NOT DETECTED Final   Candida albicans NOT DETECTED NOT DETECTED Final   Candida auris NOT DETECTED NOT DETECTED Final   Candida glabrata NOT DETECTED NOT DETECTED Final   Candida krusei NOT DETECTED NOT DETECTED Final   Candida parapsilosis NOT DETECTED NOT DETECTED Final   Candida tropicalis NOT DETECTED NOT DETECTED Final   Cryptococcus neoformans/gattii NOT DETECTED NOT DETECTED Final   Meth resistant mecA/C and MREJ NOT DETECTED NOT DETECTED Final    Comment: Performed at [facility], [address] [place], [place] [address]  Culture, blood (routine x 2)     Status: Abnormal   Collection Time: 03/29/23  5:29 PM   Specimen: BLOOD  Result Value Ref Range Status   Specimen Description   Final    BLOOD RFOA Performed at [facility], [address], [place], [place] [address]    Special Requests   Final    BOTTLES DRAWN AEROBIC AND ANAEROBIC Blood Culture results may not be optimal due to an inadequate volume of blood received in culture bottles Performed at [facility], [address], [place], [place] [address]    Culture  Setup Time   Final    GRAM POSITIVE COCCI ANAEROBIC BOTTLE ONLY Gram Stain Report Called to,Read Back By and Verified With: DOROTHA NED @MC  [phone], VIRAY,J Performed at [facility], [address] [place], [place] [address]    Culture STAPHYLOCOCCUS AUREUS (A)  Final   Report Status 04/02/2023 FINAL  Final   Organism ID, Bacteria STAPHYLOCOCCUS AUREUS  Final      Susceptibility   Staphylococcus aureus - MIC*    CIPROFLOXACIN <=0.5 SENSITIVE Sensitive     ERYTHROMYCIN <=0.25 SENSITIVE Sensitive     GENTAMICIN <=0.5 SENSITIVE Sensitive     OXACILLIN 0.5 SENSITIVE Sensitive     TETRACYCLINE <=1 SENSITIVE Sensitive      VANCOMYCIN  1 SENSITIVE Sensitive     TRIMETH/SULFA <=10 SENSITIVE Sensitive     CLINDAMYCIN <=0.25 SENSITIVE Sensitive     RIFAMPIN <=0.5 SENSITIVE Sensitive     Inducible Clindamycin NEGATIVE Sensitive     LINEZOLID 2 SENSITIVE Sensitive     * STAPHYLOCOCCUS AUREUS  Culture, blood (routine x 2)     Status: None   Collection Time: 03/29/23  5:35 PM   Specimen: BLOOD LEFT FOREARM  Result Value Ref Range Status   Specimen Description BLOOD LEFT FOREARM  Final   Special Requests   Final    BOTTLES DRAWN AEROBIC ONLY Blood Culture results may not be optimal due to an inadequate volume of blood received in culture bottles   Culture   Final    NO GROWTH 5 DAYS Performed at [facility], [address] [place], [place] [address]    Report Status 04/03/2023 FINAL  Final  Culture, blood (Routine X 2) w Reflex to ID Panel     Status: None (Preliminary result)   Collection Time: 04/01/23  2:18 PM   Specimen: BLOOD LEFT HAND  Result Value Ref Range Status   Specimen Description BLOOD LEFT HAND  Final   Special Requests   Final    BOTTLES DRAWN AEROBIC AND ANAEROBIC Blood Culture results may not be optimal due to  an inadequate volume of blood received in culture bottles   Culture   Final    NO GROWTH 4 DAYS Performed at [facility], [address], [place], [place] [address]    Report Status PENDING  Incomplete  Culture, blood (Routine X 2) w Reflex to ID Panel     Status: None (Preliminary result)   Collection Time: 04/01/23  2:18 PM   Specimen: BLOOD RIGHT HAND  Result Value Ref Range Status   Specimen Description BLOOD RIGHT HAND  Final   Special Requests   Final    BOTTLES DRAWN AEROBIC AND ANAEROBIC Blood Culture results may not be optimal due to an inadequate volume of blood received in culture bottles   Culture   Final    NO GROWTH 4 DAYS Performed at [facility], [address], [place], [place] [address]    Report Status PENDING  Incomplete     Radiology  Studies: No results found.  Scheduled Meds:  amitriptyline   25-50 mg Oral QHS   buPROPion   300 mg Oral Daily   DULoxetine   60 mg Oral Daily   famotidine   20 mg Oral QHS   gabapentin   900 mg Oral Daily   insulin  aspart  0-15 Units Subcutaneous TID WC   insulin  aspart  0-5 Units Subcutaneous QHS   irbesartan   37.5 mg Oral Daily   levothyroxine   100 mcg Oral Q0600   lidocaine   1 patch Transdermal Q24H   metFORMIN   1,000 mg Oral BID WC   naproxen   500 mg Oral BID WC   pantoprazole   40 mg Oral Daily   rOPINIRole   3 mg Oral Daily   simvastatin   40 mg Oral q1800   Continuous Infusions:   ceFAZolin  (ANCEF ) IV 2 g (04/05/23 0544)     LOS: 7 days   Ivonne Mustache, MD [facility] P1/10/2023, 1:20 PM

## 2023-04-05 NOTE — Plan of Care (Signed)
   Problem: Clinical Measurements: Goal: Cardiovascular complication will be avoided Outcome: Progressing   Problem: Activity: Goal: Risk for activity intolerance will decrease Outcome: Progressing   Problem: Coping: Goal: Level of anxiety will decrease Outcome: Progressing

## 2023-04-05 NOTE — Progress Notes (Signed)
 Physical Therapy Treatment Patient Details Name: Debbie Goodwin MRN: [id number] DOB: Mar 08, 1971 Today's Date: 04/05/2023   History of Present Illness 53 y.o. female  presents to ED 03/29/2023 for positive blood cultures from ED visit 12/31 secondary to complaints of a fever. MRI showed multiple spinal abscesses.  PMH obesity, hypertension, fibromyalgia, hyperlipidemia, diabetes mellitus, chronic cervical radiculopathy, status post decompression surgery by Dr. Mavis in 2022,  lumbar steroid injection every 53-month by Dr. Darlis, most recent in September 2024.    PT Comments  Continuing work on functional mobility and activity tolerance; session focused on progressive ambulation, and stair training in preparation for DC home; her bedside commode and wheelchair has been delivered to her room as well, and educated patient on wheelchair brakes and leg rests; able to walk in the hallway with the rolling walker overall well and good household distances; stair training complete; questions solicited, and answered; OK for DC from a functional mobility standpoint   If plan is discharge home, recommend the following: Assistance with cooking/housework;Assist for transportation;Help with stairs or ramp for entrance;A little help with walking and/or transfers   Can travel by private vehicle        Equipment Recommendations  Wheelchair (measurements PT);Wheelchair cushion (measurements PT);BSC/3in1    Recommendations for Other Services       Precautions / Restrictions Precautions Precautions: Fall Precaution Comments: reports 2 falls PTA, back precautions for comfort Restrictions Weight Bearing Restrictions Per Provider Order: No     Mobility  Bed Mobility                    Transfers Overall transfer level: Needs assistance Equipment used: Rolling walker (2 wheels) Transfers: Sit to/from Stand Sit to Stand: Supervision           General transfer comment: More steady today     Ambulation/Gait Ambulation/Gait assistance: Contact guard assist, Supervision Gait Distance (Feet): 90 Feet Assistive device: Rolling walker (2 wheels) Gait Pattern/deviations: Step-through pattern, Decreased stride length Gait velocity: decreased     General Gait Details: Mild unsteadiness, but safe with RW   Stairs Stairs: Yes Stairs assistance: Contact guard assist Stair Management: One rail Right, Forwards Number of Stairs: 4 General stair comments: Cues fro hand placement and safety   Wheelchair Mobility     Tilt Bed    Modified Rankin (Stroke Patients Only)       Balance     Sitting balance-Leahy Scale: Good       Standing balance-Leahy Scale: Fair                              Cognition Arousal: Alert Behavior During Therapy: WFL for tasks assessed/performed Overall Cognitive Status: Within Functional Limits for tasks assessed                                          Exercises      General Comments General comments (skin integrity, edema, etc.): Discussed stairs considerations for dc      Pertinent Vitals/Pain Pain Assessment Pain Assessment: Faces Faces Pain Scale: Hurts a little bit Pain Location: B hips and back Pain Descriptors / Indicators: Grimacing, Guarding, Sore Pain Intervention(s): Monitored during session    Home Living  Prior Function            PT Goals (current goals can now be found in the care plan section) Acute Rehab PT Goals Patient Stated Goal: decr pain and return to independent PT Goal Formulation: With patient Time For Goal Achievement: 04/16/23 Potential to Achieve Goals: Good Progress towards PT goals: Progressing toward goals    Frequency    Min 1X/week      PT Plan      Co-evaluation              AM-PAC PT 6 Clicks Mobility   Outcome Measure  Help needed turning from your back to your side while in a flat bed without  using bedrails?: None Help needed moving from lying on your back to sitting on the side of a flat bed without using bedrails?: None Help needed moving to and from a bed to a chair (including a wheelchair)?: A Little Help needed standing up from a chair using your arms (e.g., wheelchair or bedside chair)?: A Little Help needed to walk in hospital room?: A Little Help needed climbing 3-5 steps with a railing? : A Little 6 Click Score: 20    End of Session Equipment Utilized During Treatment: Gait belt (at axillae) Activity Tolerance: Patient tolerated treatment well Patient left: in bed;with call bell/phone within reach Nurse Communication: Mobility status PT Visit Diagnosis: Unsteadiness on feet (R26.81);Repeated falls (R29.6);Muscle weakness (generalized) (M62.81);Pain Pain - Right/Left:  (bil) Pain - part of body: Leg (lower torso)     Time: 8891-8869 PT Time Calculation (min) (ACUTE ONLY): 22 min  Charges:    $Gait Training: 8-22 mins PT General Charges $$ ACUTE PT VISIT: 1 Visit                     Silvano Currier, PT  Acute Rehabilitation Services Office [phone] Secure Chat welcomed    Silvano VEAR Currier 04/05/2023, 2:05 PM

## 2023-04-06 ENCOUNTER — Other Ambulatory Visit: Payer: Self-pay

## 2023-04-06 DIAGNOSIS — G061 Intraspinal abscess and granuloma: Secondary | ICD-10-CM | POA: Diagnosis not present

## 2023-04-06 DIAGNOSIS — B9561 Methicillin susceptible Staphylococcus aureus infection as the cause of diseases classified elsewhere: Secondary | ICD-10-CM | POA: Diagnosis not present

## 2023-04-06 DIAGNOSIS — G039 Meningitis, unspecified: Secondary | ICD-10-CM | POA: Diagnosis not present

## 2023-04-06 DIAGNOSIS — M4644 Discitis, unspecified, thoracic region: Secondary | ICD-10-CM | POA: Diagnosis not present

## 2023-04-06 LAB — CULTURE, BLOOD (ROUTINE X 2)
Culture: NO GROWTH
Culture: NO GROWTH

## 2023-04-06 LAB — GLUCOSE, CAPILLARY
Glucose-Capillary: 133 mg/dL — ABNORMAL HIGH (ref 70–99)
Glucose-Capillary: 96 mg/dL (ref 70–99)

## 2023-04-06 MED ORDER — LIDOCAINE 5 % EX PTCH: 1.0000 | MEDICATED_PATCH | CUTANEOUS | 0 refills | Status: DC

## 2023-04-06 MED ORDER — CEFAZOLIN IV (FOR PTA / DISCHARGE USE ONLY): 2.0000 g | Freq: Three times a day (TID) | INTRAVENOUS | 0 refills | Status: AC

## 2023-04-06 MED ORDER — OXYCODONE HCL 10 MG PO TABS: 10.0000 mg | ORAL_TABLET | Freq: Four times a day (QID) | ORAL | Status: DC | PRN

## 2023-04-06 MED ORDER — CHLORHEXIDINE GLUCONATE CLOTH 2 % EX PADS
6.0000 | MEDICATED_PAD | Freq: Every day | CUTANEOUS | Status: DC
  Administered 2023-04-06: 6 via TOPICAL

## 2023-04-06 MED ORDER — SODIUM CHLORIDE 0.9% FLUSH: 10.0000 mL | INTRAVENOUS | Status: DC | PRN

## 2023-04-06 MED ORDER — OXYCODONE HCL 10 MG PO TABS: 10.0000 mg | ORAL_TABLET | Freq: Four times a day (QID) | ORAL | 0 refills | Status: DC | PRN

## 2023-04-06 NOTE — Progress Notes (Signed)
 [facility]  Date of Admission:  03/29/2023     Total days of antibiotics 10         ASSESSMENT:  Debbie Goodwin blood cultures are cleared in the setting of epidural abscess of the thoracic spine. Discussed plan of care for 6 weeks of antibiotics through 05/13/23 with Cefazolin  through her PICC line. Home Health/OPAT orders placed. Follow up scheduled in ID clinic. Ok for discharge from ID standpoint. Remaining medical and supportive care per Internal Medicine.   PLAN:  Continue current dose of Cefazolin  through 05/13/23. Home Health/OPAT orders.  PICC line care per protocol. Follow up in ID office scheduled.  Ok for discharge from ID standpoint.  Remaining medical and supportive care per Internal Medicine.   Diagnosis:  Epidural abscess of the thoracic spine  Culture Result: MSSA  Allergies  Allergen Reactions   Sulfa Antibiotics Hives    OPAT Orders Discharge antibiotics to be given via PICC line Discharge antibiotics: Cefazolin   Per pharmacy protocol   Duration: 6 weeks End Date: 05/13/23  [facility] Care Per Protocol:  Home health RN for IV administration and teaching; PICC line care and labs.    Labs weekly while on IV antibiotics: _X_ CBC with differential _X_ BMP __ CMP _X_ CRP _X_ ESR __ Vancomycin  trough __ CK  _X_ Please pull PIC at completion of IV antibiotics __ Please leave PIC in place until doctor has seen patient or been notified  Fax weekly labs to [phone]  Clinic Follow Up Appt:  04/12/23 at 10:15 am with Dr. 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 Active Problems:   HTN (hypertension)   HLD (hyperlipidemia)   MSSA bacteremia   OSA (obstructive sleep apnea)   Hypokalemia   Hardware complicating wound infection (HCC)   Discitis of cervicothoracic region   Mitral valve insufficiency   Infection of cervical spine (HCC)   Therapeutic drug monitoring   Epidural  abscess    amitriptyline   25-50 mg Oral QHS   buPROPion   300 mg Oral Daily   Chlorhexidine  Gluconate Cloth  6 each Topical Daily   DULoxetine   60 mg Oral Daily   famotidine   20 mg Oral QHS   gabapentin   900 mg Oral Daily   insulin  aspart  0-15 Units Subcutaneous TID WC   insulin  aspart  0-5 Units Subcutaneous QHS   irbesartan   37.5 mg Oral Daily   levothyroxine   100 mcg Oral Q0600   lidocaine   1 patch Transdermal Q24H   metFORMIN   1,000 mg Oral BID WC   naproxen   500 mg Oral BID WC   pantoprazole   40 mg Oral Daily   rOPINIRole   3 mg Oral Daily   simvastatin   40 mg Oral q1800    SUBJECTIVE:  Afebrile overnight with no acute events. Tolerating antibiotics with no adverse side effects.   Allergies  Allergen Reactions   Sulfa Antibiotics Hives     Review of Systems: Review of Systems  Constitutional:  Negative for chills, fever and weight loss.  Respiratory:  Negative for cough, shortness of breath and wheezing.   Cardiovascular:  Negative for chest pain and leg swelling.  Gastrointestinal:  Negative for abdominal pain, constipation, diarrhea, nausea and vomiting.  Musculoskeletal:  Positive for back pain.  Skin:  Negative for rash.      OBJECTIVE: Vitals:   04/05/23 1744 04/05/23 2229 04/06/23 0847 04/06/23 0855  BP: 127/74 111/68 (!) 158/66  Pulse: 86 87 82   Resp: 18   17  Temp: 98.2 F (36.8 C) 98.3 F (36.8 C) 98.7 F (37.1 C)   TempSrc:  Oral Oral   SpO2: 99% 98% 100%   Weight:      Height:       Body mass index is 48.89 kg/m.  Physical Exam Constitutional:      General: She is not in acute distress.    Appearance: She is well-developed.  Cardiovascular:     Rate and Rhythm: Normal rate and regular rhythm.     Heart sounds: Normal heart sounds.  Pulmonary:     Effort: Pulmonary effort is normal.     Breath sounds: Normal breath sounds.  Skin:    General: Skin is warm and dry.  Neurological:     Mental Status: She is alert and oriented to  person, place, and time.  Psychiatric:        Mood and Affect: Mood normal.     Lab Results Lab Results  Component Value Date   WBC 11.1 (H) 04/05/2023   HGB 10.0 (L) 04/05/2023   HCT 31.9 (L) 04/05/2023   MCV 92.7 04/05/2023   PLT 353 04/05/2023    Lab Results  Component Value Date   CREATININE 0.81 04/05/2023   BUN 7 04/05/2023   NA 139 04/05/2023   K 3.6 04/05/2023   CL 99 04/05/2023   CO2 28 04/05/2023    Lab Results  Component Value Date   ALT 37 04/01/2023   AST 36 04/01/2023   ALKPHOS 94 04/01/2023   BILITOT 1.1 04/01/2023     Microbiology: Recent Results (from the past 240 hours)  Resp panel by RT-PCR (RSV, Flu A&B, Covid) Anterior Nasal Swab     Status: None   Collection Time: 03/28/23  6:31 PM   Specimen: Anterior Nasal Swab  Result Value Ref Range Status   SARS Coronavirus 2 by RT PCR NEGATIVE NEGATIVE Final    Comment: (NOTE) SARS-CoV-2 target nucleic acids are NOT DETECTED.  The SARS-CoV-2 RNA is generally detectable in upper respiratory specimens during the acute phase of infection. The lowest concentration of SARS-CoV-2 viral copies this assay can detect is 138 copies/mL. A negative result does not preclude SARS-Cov-2 infection and should not be used as the sole basis for treatment or other patient management decisions. A negative result may occur with  improper specimen collection/handling, submission of specimen other than nasopharyngeal swab, presence of viral mutation(s) within the areas targeted by this assay, and inadequate number of viral copies(<138 copies/mL). A negative result must be combined with clinical observations, patient history, and epidemiological information. The expected result is Negative.  Fact Sheet for Patients:  bloggercourse.com  Fact Sheet for Healthcare Providers:  seriousbroker.it  This test is no t yet approved or cleared by the [place]  FDA and  has been  authorized for detection and/or diagnosis of SARS-CoV-2 by FDA under an Emergency Use Authorization (EUA). This EUA will remain  in effect (meaning this test can be used) for the duration of the COVID-19 declaration under Section 564(b)(1) of the Act, 21 U.S.C.section 360bbb-3(b)(1), unless the authorization is terminated  or revoked sooner.       Influenza A by PCR NEGATIVE NEGATIVE Final   Influenza B by PCR NEGATIVE NEGATIVE Final    Comment: (NOTE) The Xpert Xpress SARS-CoV-2/FLU/RSV plus assay is intended as an aid in the diagnosis of influenza from Nasopharyngeal swab specimens and should not be used as a  sole basis for treatment. Nasal washings and aspirates are unacceptable for Xpert Xpress SARS-CoV-2/FLU/RSV testing.  Fact Sheet for Patients: bloggercourse.com  Fact Sheet for Healthcare Providers: seriousbroker.it  This test is not yet approved or cleared by the [place]  FDA and has been authorized for detection and/or diagnosis of SARS-CoV-2 by FDA under an Emergency Use Authorization (EUA). This EUA will remain in effect (meaning this test can be used) for the duration of the COVID-19 declaration under Section 564(b)(1) of the Act, 21 U.S.C. section 360bbb-3(b)(1), unless the authorization is terminated or revoked.     Resp Syncytial Virus by PCR NEGATIVE NEGATIVE Final    Comment: (NOTE) Fact Sheet for Patients: bloggercourse.com  Fact Sheet for Healthcare Providers: seriousbroker.it  This test is not yet approved or cleared by the [place]  FDA and has been authorized for detection and/or diagnosis of SARS-CoV-2 by FDA under an Emergency Use Authorization (EUA). This EUA will remain in effect (meaning this test can be used) for the duration of the COVID-19 declaration under Section 564(b)(1) of the Act, 21 U.S.C. section 360bbb-3(b)(1), unless the  authorization is terminated or revoked.  Performed at [facility], [address] [place], [place] [address]   Blood culture (routine x 2)     Status: Abnormal   Collection Time: 03/28/23  8:17 PM   Specimen: Right Antecubital; Blood  Result Value Ref Range Status   Specimen Description   Final    RIGHT ANTECUBITAL Performed at [facility], [address] [place], [place] [address]    Special Requests   Final    BOTTLES DRAWN AEROBIC AND ANAEROBIC Blood Culture results may not be optimal due to an inadequate volume of blood received in culture bottles Performed at [facility], [address], [place], [place] [address]    Culture  Setup Time   Final    AEROBIC BOTTLE ONLY GRAM POSITIVE COCCI Gram Stain Report Called to,Read Back By and Verified With: EMILY GANT @ 1053 ON 03/29/23 C VARNER GRAM STAIN REVIEWED-AGREE WITH RESULT DRT    Culture (A)  Final    STAPHYLOCOCCUS AUREUS SUSCEPTIBILITIES PERFORMED ON PREVIOUS CULTURE WITHIN THE LAST 5 DAYS. Performed at [facility], [address], [place], [place] [address]    Report Status 03/31/2023 FINAL  Final  Blood culture (routine x 2)     Status: Abnormal   Collection Time: 03/28/23  8:53 PM   Specimen: BLOOD LEFT HAND  Result Value Ref Range Status   Specimen Description   Final    BLOOD LEFT HAND Performed at [facility], [address] [place], [place] [address]    Special Requests   Final    BOTTLES DRAWN AEROBIC AND ANAEROBIC Blood Culture adequate volume Performed at [facility], [address], [place], [place] [address]    Culture  Setup Time   Final    GRAM POSITIVE COCCI IN BOTH AEROBIC AND ANAEROBIC BOTTLES Gram Stain Report Called to,Read Back By and Verified With: DOSS M @ 1300 ON [id number] BY HENDERSON L GRAM STAIN REVIEWED-AGREE WITH RESULT DRT CRITICAL RESULT CALLED TO, READ BACK BY AND VERIFIED WITH: RN CHRISTY EDWARDS ON 03/29/23 @ 1916 BY DRT Performed at [facility], [address],  [place], [place] [address]    Culture STAPHYLOCOCCUS AUREUS (A)  Final   Report Status 03/31/2023 FINAL  Final   Organism ID, Bacteria STAPHYLOCOCCUS AUREUS  Final      Susceptibility   Staphylococcus aureus - MIC*  CIPROFLOXACIN <=0.5 SENSITIVE Sensitive     ERYTHROMYCIN <=0.25 SENSITIVE Sensitive     GENTAMICIN <=0.5 SENSITIVE Sensitive     OXACILLIN 0.5 SENSITIVE Sensitive     TETRACYCLINE <=1 SENSITIVE Sensitive     VANCOMYCIN  1 SENSITIVE Sensitive     TRIMETH/SULFA <=10 SENSITIVE Sensitive     CLINDAMYCIN <=0.25 SENSITIVE Sensitive     RIFAMPIN <=0.5 SENSITIVE Sensitive     Inducible Clindamycin NEGATIVE Sensitive     LINEZOLID 2 SENSITIVE Sensitive     * STAPHYLOCOCCUS AUREUS  Blood Culture ID Panel (Reflexed)     Status: Abnormal   Collection Time: 03/28/23  8:53 PM  Result Value Ref Range Status   Enterococcus faecalis NOT DETECTED NOT DETECTED Final   Enterococcus Faecium NOT DETECTED NOT DETECTED Final   Listeria monocytogenes NOT DETECTED NOT DETECTED Final   Staphylococcus species DETECTED (A) NOT DETECTED Final    Comment: CRITICAL RESULT CALLED TO, READ BACK BY AND VERIFIED WITH: RN CHRISTY EDWARDS ON 03/29/23 @ 1916 BY DRT    Staphylococcus aureus (BCID) DETECTED (A) NOT DETECTED Final    Comment: CRITICAL RESULT CALLED TO, READ BACK BY AND VERIFIED WITH: RN CHRISTY EDWARDS ON 03/29/23 @ 1916 BY DRT    Staphylococcus epidermidis NOT DETECTED NOT DETECTED Final   Staphylococcus lugdunensis NOT DETECTED NOT DETECTED Final   Streptococcus species NOT DETECTED NOT DETECTED Final   Streptococcus agalactiae NOT DETECTED NOT DETECTED Final   Streptococcus pneumoniae NOT DETECTED NOT DETECTED Final   Streptococcus pyogenes NOT DETECTED NOT DETECTED Final   A.calcoaceticus-baumannii NOT DETECTED NOT DETECTED Final   Bacteroides fragilis NOT DETECTED NOT DETECTED Final   Enterobacterales NOT DETECTED NOT DETECTED Final   Enterobacter cloacae complex NOT DETECTED NOT DETECTED  Final   Escherichia coli NOT DETECTED NOT DETECTED Final   Klebsiella aerogenes NOT DETECTED NOT DETECTED Final   Klebsiella oxytoca NOT DETECTED NOT DETECTED Final   Klebsiella pneumoniae NOT DETECTED NOT DETECTED Final   Proteus species NOT DETECTED NOT DETECTED Final   Salmonella species NOT DETECTED NOT DETECTED Final   Serratia marcescens NOT DETECTED NOT DETECTED Final   Haemophilus influenzae NOT DETECTED NOT DETECTED Final   Neisseria meningitidis NOT DETECTED NOT DETECTED Final   Pseudomonas aeruginosa NOT DETECTED NOT DETECTED Final   Stenotrophomonas maltophilia NOT DETECTED NOT DETECTED Final   Candida albicans NOT DETECTED NOT DETECTED Final   Candida auris NOT DETECTED NOT DETECTED Final   Candida glabrata NOT DETECTED NOT DETECTED Final   Candida krusei NOT DETECTED NOT DETECTED Final   Candida parapsilosis NOT DETECTED NOT DETECTED Final   Candida tropicalis NOT DETECTED NOT DETECTED Final   Cryptococcus neoformans/gattii NOT DETECTED NOT DETECTED Final   Meth resistant mecA/C and MREJ NOT DETECTED NOT DETECTED Final    Comment: Performed at [facility], [address], [place], [place] [address]  Culture, blood (routine x 2)     Status: Abnormal   Collection Time: 03/29/23  5:29 PM   Specimen: BLOOD  Result Value Ref Range Status   Specimen Description   Final    BLOOD RFOA Performed at [facility], [address], [place], [place] [address]    Special Requests   Final    BOTTLES DRAWN AEROBIC AND ANAEROBIC Blood Culture results may not be optimal due to an inadequate volume of blood received in culture bottles Performed at [facility], [address], [place], [place] [address]    Culture  Setup Time   Final  GRAM POSITIVE COCCI ANAEROBIC BOTTLE ONLY Gram Stain Report Called to,Read Back By and Verified With: DOROTHA NED @MC  [phone], VIRAY,J Performed at [facility], [address], [place], [place] [address]    Culture STAPHYLOCOCCUS AUREUS (A)   Final   Report Status 04/02/2023 FINAL  Final   Organism ID, Bacteria STAPHYLOCOCCUS AUREUS  Final      Susceptibility   Staphylococcus aureus - MIC*    CIPROFLOXACIN <=0.5 SENSITIVE Sensitive     ERYTHROMYCIN <=0.25 SENSITIVE Sensitive     GENTAMICIN <=0.5 SENSITIVE Sensitive     OXACILLIN 0.5 SENSITIVE Sensitive     TETRACYCLINE <=1 SENSITIVE Sensitive     VANCOMYCIN  1 SENSITIVE Sensitive     TRIMETH/SULFA <=10 SENSITIVE Sensitive     CLINDAMYCIN <=0.25 SENSITIVE Sensitive     RIFAMPIN <=0.5 SENSITIVE Sensitive     Inducible Clindamycin NEGATIVE Sensitive     LINEZOLID 2 SENSITIVE Sensitive     * STAPHYLOCOCCUS AUREUS  Culture, blood (routine x 2)     Status: None   Collection Time: 03/29/23  5:35 PM   Specimen: BLOOD LEFT FOREARM  Result Value Ref Range Status   Specimen Description BLOOD LEFT FOREARM  Final   Special Requests   Final    BOTTLES DRAWN AEROBIC ONLY Blood Culture results may not be optimal due to an inadequate volume of blood received in culture bottles   Culture   Final    NO GROWTH 5 DAYS Performed at [facility], [address] [place], [place] [address]    Report Status 04/03/2023 FINAL  Final  Culture, blood (Routine X 2) w Reflex to ID Panel     Status: None   Collection Time: 04/01/23  2:18 PM   Specimen: BLOOD LEFT HAND  Result Value Ref Range Status   Specimen Description BLOOD LEFT HAND  Final   Special Requests   Final    BOTTLES DRAWN AEROBIC AND ANAEROBIC Blood Culture results may not be optimal due to an inadequate volume of blood received in culture bottles   Culture   Final    NO GROWTH 5 DAYS Performed at [facility], [address], [place], [place] [address]    Report Status 04/06/2023 FINAL  Final  Culture, blood (Routine X 2) w Reflex to ID Panel     Status: None   Collection Time: 04/01/23  2:18 PM   Specimen: BLOOD RIGHT HAND  Result Value Ref Range Status   Specimen Description BLOOD RIGHT HAND  Final   Special Requests    Final    BOTTLES DRAWN AEROBIC AND ANAEROBIC Blood Culture results may not be optimal due to an inadequate volume of blood received in culture bottles   Culture   Final    NO GROWTH 5 DAYS Performed at [facility], [address] [place], [place] [address]    Report Status 04/06/2023 FINAL  Final     Cathlyn July, NP [facility]  04/06/2023  4:10 PM

## 2023-04-06 NOTE — Plan of Care (Signed)
  Problem: Pain Management: Goal: General experience of comfort will improve Outcome: Progressing   Problem: Safety: Goal: Ability to remain free from injury will improve Outcome: Progressing   Problem: Skin Integrity: Goal: Risk for impaired skin integrity will decrease Outcome: Progressing

## 2023-04-06 NOTE — Progress Notes (Signed)
 Peripherally Inserted Central Catheter Placement  The IV Nurse has discussed with the patient and/or persons authorized to consent for the patient, the purpose of this procedure and the potential benefits and risks involved with this procedure.  The benefits include less needle sticks, lab draws from the catheter, and the patient may be discharged home with the catheter. Risks include, but not limited to, infection, bleeding, blood clot (thrombus formation), and puncture of an artery; nerve damage and irregular heartbeat and possibility to perform a PICC exchange if needed/ordered by physician.  Alternatives to this procedure were also discussed.  Bard Power PICC patient education guide, fact sheet on infection prevention and patient information card has been provided to patient /or left at bedside.    PICC Placement Documentation  PICC Single Lumen 04/06/23 Right Basilic 38 cm 0 cm (Active)  Indication for Insertion or Continuance of Line Home intravenous therapies (PICC only) 04/06/23 1216  Exposed Catheter (cm) 0 cm 04/06/23 1216  Site Assessment Clean, Dry, Intact 04/06/23 1216  Line Status Flushed;Saline locked;Blood return noted 04/06/23 1216  Dressing Type Transparent;Securing device 04/06/23 1216  Dressing Status Antimicrobial disc/dressing in place;Clean, Dry, Intact 04/06/23 1216  Line Care Connections checked and tightened 04/06/23 1216  Line Adjustment (NICU/IV Team Only) No 04/06/23 1216  Dressing Intervention New dressing;Adhesive placed at insertion site (IV team only) 04/06/23 1216  Dressing Change Due 04/13/23 04/06/23 1216       Renaee Notice Albarece 04/06/2023, 12:18 PM

## 2023-04-06 NOTE — TOC Transition Note (Signed)
 Transition of Care [facility]) - Discharge Note   Patient Details  Name: Debbie Goodwin MRN: [id number] Date of Birth: 03-21-1971  Transition of Care [facility] CM/SW Contact:  Tom-Johnson, Harvest Muskrat, RN Phone Number: 04/06/2023, 12:43 PM   Clinical Narrative:     Patient is scheduled for discharge today.  Readmission Risk Assessment done. Home health info, hospital f/u and discharge instructions on AVS. Sister, Debbie to transport at discharge.  No further TOC needs noted.         Final next level of care: Home w Home Health Services Barriers to Discharge: Barriers Resolved   Patient Goals and CMS Choice Patient states their goals for this hospitalization and ongoing recovery are:: To return home CMS Medicare.gov Compare Post Acute Care list provided to:: Patient Choice offered to / list presented to : Patient      Discharge Placement                Patient to be transferred to facility by: Sister Name of family member notified: Debbie Goodwin    Discharge Plan and Services Additional resources added to the After Visit Summary for                  DME Arranged: Bedside commode, Wheelchair manual DME Agency: [employer] Date DME Agency Contacted: 04/04/23 Time DME Agency Contacted: 1514 Representative spoke with at DME Agency: [place] HH Arranged: RN, PT HH Agency: [facility] ([employer] for RN Home IV infusion and [facility] for Home health PT.)        Social Drivers of Health (SDOH) Interventions SDOH Screenings   Food Insecurity: No Food Insecurity (03/30/2023)  Housing: Unknown (03/30/2023)  Transportation Needs: No Transportation Needs (03/30/2023)  Utilities: Not At Risk (03/30/2023)  Tobacco Use: Low Risk  (03/29/2023)     Readmission Risk Interventions    04/04/2023    2:53 PM  Readmission Risk Prevention Plan  Transportation Screening Complete  PCP or Specialist Appt within 5-7 Days Complete  Home Care Screening Complete   Medication Review (RN CM) Referral to Pharmacy

## 2023-04-06 NOTE — Progress Notes (Signed)
 Physical Therapy Treatment Patient Details Name: Debbie Goodwin MRN: [id number] DOB: [birth year] Today's Date: 04/06/2023   History of Present Illness 53 y.o. female  presents to ED 03/29/2023 for positive blood cultures from ED visit 12/31 secondary to complaints of a fever. MRI showed multiple spinal abscesses. PMH obesity, hypertension, fibromyalgia, hyperlipidemia, diabetes mellitus, chronic cervical radiculopathy, status post decompression surgery by Dr. Mavis in 2022,  lumbar steroid injection every 10-month by Dr. Darlis, most recent in September 2024.    PT Comments  The pt was agreeable to session with focus on final education and gait training prior to anticipated d/c home. She reports good improvement in pain which has allowed her to progress mobility significantly. Upon my arrival, she was ambulating in the room without assistance, no use of DME, and reports no pain. She was able to complete hallway ambulation with good stability even without DME, and scored 20/24 on DGI. Pt reports no concerns about mobility or safety once returning home. Does demo mild instability with addition of head turns when walking, discussed implication of this on safety with mobility at home. Pt is safe from mobility standpoint to return home once medically cleared.   Dynamic Gait Index (DGI): 20/24 (<19 indicates increased risk for falls)   If plan is discharge home, recommend the following: Help with stairs or ramp for entrance;Assist for transportation   Can travel by private vehicle        Equipment Recommendations  Wheelchair (measurements PT);Wheelchair cushion (measurements PT);BSC/3in1    Recommendations for Other Services       Precautions / Restrictions Precautions Precautions: Fall Precaution Comments: reports 2 falls PTA, back precautions for comfort Restrictions Weight Bearing Restrictions Per Provider Order: No     Mobility  Bed Mobility Overal bed mobility: Independent                   Transfers Overall transfer level: Independent Equipment used: None Transfers: Sit to/from Stand Sit to Stand: Independent           General transfer comment: no use of DME, pt independent in room standing from EOB and toilet    Ambulation/Gait Ambulation/Gait assistance: Supervision Gait Distance (Feet): 600 Feet Assistive device: None Gait Pattern/deviations: Step-through pattern, Decreased stride length, Narrow base of support Gait velocity: 0.56 m/s Gait velocity interpretation: 1.31 - 2.62 ft/sec, indicative of limited community ambulator   General Gait Details: mild instability, but much better than prior session. no overt LOB no need for UE support, minimal issue with direct balance challenge   Stairs Stairs: Yes Stairs assistance: Contact guard assist Stair Management: One rail Right, Forwards Number of Stairs: 5 General stair comments: Cues fro hand placement and safety     Balance Overall balance assessment: Needs assistance Sitting-balance support: No upper extremity supported, Feet supported Sitting balance-Leahy Scale: Normal     Standing balance support: No upper extremity supported, During functional activity, Bilateral upper extremity supported Standing balance-Leahy Scale: Good                   Standardized Balance Assessment Standardized Balance Assessment : Dynamic Gait Index   Dynamic Gait Index Level Surface: Normal Change in Gait Speed: Normal Gait with Horizontal Head Turns: Mild Impairment Gait with Vertical Head Turns: Moderate Impairment Gait and Pivot Turn: Normal Step Over Obstacle: Normal Step Around Obstacles: Normal Steps: Mild Impairment Total Score: 20      Cognition Arousal: Alert Behavior During Therapy: WFL for tasks assessed/performed Overall Cognitive Status: Within  Functional Limits for tasks assessed                                                  Pertinent Vitals/Pain  Pain Assessment Pain Assessment: No/denies pain Pain Score: 0-No pain Faces Pain Scale: No hurt Pain Intervention(s): Monitored during session      PT Goals (current goals can now be found in the care plan section) Acute Rehab PT Goals Patient Stated Goal: decr pain and return to independent PT Goal Formulation: With patient Time For Goal Achievement: 04/16/23 Potential to Achieve Goals: Good Progress towards PT goals: Progressing toward goals    Frequency    Min 1X/week        AM-PAC PT 6 Clicks Mobility   Outcome Measure  Help needed turning from your back to your side while in a flat bed without using bedrails?: None Help needed moving from lying on your back to sitting on the side of a flat bed without using bedrails?: None Help needed moving to and from a bed to a chair (including a wheelchair)?: None Help needed standing up from a chair using your arms (e.g., wheelchair or bedside chair)?: None Help needed to walk in hospital room?: A Little Help needed climbing 3-5 steps with a railing? : A Little 6 Click Score: 22    End of Session Equipment Utilized During Treatment: Gait belt (at axillae) Activity Tolerance: Patient tolerated treatment well Patient left: in bed;with call bell/phone within reach Nurse Communication: Mobility status PT Visit Diagnosis: Unsteadiness on feet (R26.81);Repeated falls (R29.6);Muscle weakness (generalized) (M62.81);Pain Pain - Right/Left:  (bil) Pain - part of body: Leg (lower torso)     Time: 1000-1013 PT Time Calculation (min) (ACUTE ONLY): 13 min  Charges:    $Therapeutic Exercise: 8-22 mins PT General Charges $$ ACUTE PT VISIT: 1 Visit                     Izetta Call, PT, DPT   Acute Rehabilitation Department Office [phone] Secure Chat Communication Preferred    Izetta JULIANNA Call 04/06/2023, 12:19 PM

## 2023-04-06 NOTE — Discharge Summary (Addendum)
 Physician Discharge Summary  Debbie Goodwin FMW:[id number] DOB: 08-Sep-1970 DOA: 03/29/2023  PCP: Bertell Satterfield, MD  Admit date: 03/29/2023 Discharge date: 04/06/2023  Admitted From: Home Disposition:  Home  Discharge Condition:Stable CODE STATUS:FULL Diet recommendation: Heart Healthy  Brief/Interim Summary: Patient is a 53 year old female with history of obesity, colonic diverticulosis, GERD/reflux disease: Depression, hypertension, chronic cervical radiculopathy status post decompression surgery in 2022, back pain undergoing regular lumbar steroid injections who presented with 2 weeks history of weakness, instability of hips/legs, fall, fever.  On presentation she had high-grade fever.  CT head negative for acute findings.  CT cervical spine showed.  Vertebral soft tissue swelling.  MRI cervical spine showed Prevertebral effusion extending from the skull base inferiorly to at least T4.  MRI thoracic spine showed  posterior epidural collection at T11-12 with mass effect on the posterior cord, consistent with abscess underlying right facet arthritis at T11-12, presumably septic, with myositis and smaller collection posterior to the facet as well.  ID, neurosurgery consulted.  Plan for nonoperative management with antibiotics.  Blood cultures showed MSSA.  PICC line placed.  She will be discharged on cefazolin , plan for 6 weeks  Following problems were addressed during the hospitalization:  MSSA bacteremia/epidural abscess:Presented with back pain, fever weakness.  MRI findings above.Neurosurgery consulted.  Plan for nonoperative management with antibiotics.  Blood cultures showed MSSA.   Repeat blood cultures have been negative so far for last 4 days.  Continue pain management, supportive care.  She will continue on cefazolin  at home. She will follow-up with neurosurgery, ID as an outpatient.   Hypertension: Currently blood stable.  Continue ARB   Depression: On Xanax , amitriptyline , bupropion ,  Cymbalta    GERD: Continue PPI   Diabetes type 2: On metformin  at home.  Monitor blood sugars.   OSA: On CPAP at night   Obesity: BMI 48.8   Cirrhosis: CT abdomen/pelvis showed hepatomegaly with hepatic steatosis. Nodular contours suspicious for cirrhosis. Splenomegaly.  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   Complete by: As directed    [facility] to provide Cath Flo 2mg    Complete by: As directed    Administer for PICC line occlusion and as ordered by physician for other access device issues.   Anaphylaxis Kit: Provided to treat any anaphylactic reaction to the medication being provided to the patient if First Dose or when requested by physician   Complete by: As directed    Epinephrine 1mg /ml vial / amp: Administer 0.3mg  (0.49ml) subcutaneously once for moderate to severe anaphylaxis, nurse to call physician and pharmacy when reaction occurs and call 911 if needed for immediate care   Diphenhydramine 50mg /ml IV vial: Administer 25-50mg  IV/IM PRN for first dose reaction, rash, itching, mild reaction, nurse to call physician and pharmacy when reaction occurs   Sodium Chloride  0.9% NS 500ml IV: Administer if needed for hypovolemic blood pressure drop or as ordered by  physician after call to physician with anaphylactic reaction   Change  dressing on IV access line weekly and PRN   Complete by: As directed    Diet general   Complete by: As directed    Discharge instructions   Complete by: As directed    1)Please take your medications as instructed 2)Follow up with infectious disease and orthopedics as an outpatient.   Flush IV access with Sodium Chloride  0.9% and Heparin 10 units/ml or 100 units/ml   Complete by: As directed    Home infusion instructions - [facility]   Complete by: As directed    Instructions: Flush IV access with Sodium Chloride  0.9% and Heparin 10units/ml or 100units/ml   Change dressing on IV access line: Weekly and PRN   Instructions Cath Flo 2mg : Administer for PICC Line occlusion and as ordered by physician for other access device   [facility] pharmacist to adjust dose for: Vancomycin , Aminoglycosides and other anti-infective therapies as requested by physician   Increase activity slowly   Complete by: As directed    Method of administration may be changed at the discretion of home infusion pharmacist based upon assessment of the patient and/or caregiver's ability to self-administer the medication ordered   Complete by: As directed       Allergies as of 04/06/2023       Reactions   Sulfa Antibiotics Hives        Medication List     STOP taking these medications    amoxicillin  500 MG capsule Commonly known as: AMOXIL    azithromycin  250 MG tablet Commonly known as: Zithromax  Z-Pak   omeprazole 40 MG capsule Commonly known as: PRILOSEC       TAKE these medications    ALPRAZolam  0.25 MG tablet Commonly known as: XANAX  Take 0.25 mg by mouth 3 (three) times daily as needed for anxiety.   amitriptyline  25 MG tablet Commonly known as: ELAVIL  Take 25-50 mg by mouth at bedtime.   betamethasone  dipropionate 0.05 % cream Apply 1 Application topically 2 (two) times daily as needed (rash).   Blood Glucose Monitoring Suppl Devi 1 each by Does not apply route  in the morning, at noon, and at bedtime. May substitute to any manufacturer covered by patient's insurance.   buPROPion  300 MG 24 hr tablet Commonly known as: WELLBUTRIN  XL Take 300 mg by mouth daily.   ceFAZolin  IVPB Commonly known as: ANCEF  Inject 2 g into the vein every 8 (eight) hours. Indication:  MSSA epidural abscess First Dose: Yes Last Day of Therapy:  05/13/23 Labs - Once weekly:  CBC/D and BMP, Labs - Once weekly: ESR and CRP Method of administration: IV Push Method of administration may be changed at the discretion of home infusion pharmacist based upon assessment of the patient and/or caregiver's ability to self-administer the medication ordered.   cyclobenzaprine  10 MG tablet Commonly known as: FLEXERIL  Take 10 mg by mouth 3 (three) times daily as needed for muscle spasms.   DULoxetine  60 MG capsule Commonly known as: CYMBALTA  Take 60 mg by mouth daily.   famotidine  20 MG tablet Commonly known as: Pepcid  Take 1 tablet (20 mg total) by mouth at bedtime.   Fish Oil 300 MG Caps Take 1 capsule by mouth daily.   FLAXSEED OIL PO Take 1 capsule by mouth daily.   fluconazole  150 MG tablet Commonly known as: Diflucan  Take 1 tablet (150 mg total) by mouth every three (3) days as needed (yeast infection treatment/prevention).  gabapentin  300 MG capsule Commonly known as: NEURONTIN  Take 900 mg by mouth daily.   ibuprofen 200 MG tablet Commonly known as: ADVIL Take 800 mg by mouth every 6 (six) hours as needed for moderate pain.   ketoconazole  2 % cream Commonly known as: NIZORAL  Apply topically 2 (two) times daily.   levothyroxine  100 MCG tablet Commonly known as: SYNTHROID  Take 100 mcg by mouth daily.   lidocaine  5 % Commonly known as: LIDODERM  Place 1 patch onto the skin daily. Remove & Discard patch within 12 hours or as directed by MD   metFORMIN  1000 MG tablet Commonly known as: GLUCOPHAGE  Take 1,000 mg by mouth 2 (two) times daily.    methylPREDNISolone 4 MG Tbpk tablet Commonly known as: MEDROL DOSEPAK Take 4 mg by mouth as directed.   mometasone 0.1 % ointment Commonly known as: ELOCON Apply 1 Application topically daily.   olmesartan  40 MG tablet Commonly known as: BENICAR  Take 40 mg by mouth daily.   Oxycodone  HCl 10 MG Tabs Take 1 tablet (10 mg total) by mouth every 6 (six) hours as needed (pain).   pantoprazole  40 MG tablet Commonly known as: PROTONIX  Take 40 mg by mouth daily.   polyvinyl alcohol  1.4 % ophthalmic solution Commonly known as: LIQUIFILM TEARS Place 1 drop into both eyes as needed for dry eyes.   rOPINIRole  1 MG tablet Commonly known as: REQUIP  Take 1 mg by mouth as needed (restless legs).   rOPINIRole  3 MG tablet Commonly known as: REQUIP  Take 3 mg by mouth daily.   simvastatin  40 MG tablet Commonly known as: ZOCOR  Take 40 mg by mouth daily.   VITAMIN D-3 PO Take 1 capsule by mouth daily.   ZINC ACETATE PO Take 1 tablet by mouth daily.               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  A crutch will not resolve  issue with performing activities of daily living. A wheelchair will allow patient to safely  perform daily activities.Length of need 12 months . (THEN ONE OF THESE TWO:) Patient requires a size which is not available in a standard or lightweight wheelchair and patient spends at least two hours per day in their chair. Accessories: elevating leg rests (ELRs), wheel locks, extensions and anti-tippers.   04/04/23 1452              Discharge Care Instructions  (From admission, onward)           Start     Ordered   04/06/23 0000  Change dressing on IV access line weekly and PRN  (Home infusion instructions -  [facility] )        04/06/23 1020            Follow-up Information     [facility] Follow up.   Why: Someone will call you to schaedule first home visit for IV abx treatment. Contact information: [address] [place] [address]  [phone]        [facility] Follow up.   Specialty: Home Health Services Why: Someone will call you to schedule first PT home visit. Contact information: [address] [place] [address] [phone]         Mavis Purchase, MD. Schedule an appointment as soon as possible for a visit in 2 week(s).   Specialty: Neurosurgery Contact information: [address] [place] [address] [phone]                Allergies  Allergen Reactions   Sulfa Antibiotics Hives    Consultations: ID, neurosurgery   Procedures/Studies: US  EKG SITE RITE Result Date: 04/06/2023 If Site Rite image not attached, placement could not be confirmed due to current cardiac rhythm.  ECHOCARDIOGRAM COMPLETE Result Date: 03/30/2023    ECHOCARDIOGRAM REPORT   Patient Name:   ANN BOHNE Date of Exam: 03/30/2023 Medical Rec #:  [id number]      Height:       63.0 in Accession #:    [id number]     Weight:       276.0 lb Date of Birth:  1970-12-12      BSA:          2.217 m Patient Age:    52 years       BP:           119/64 mmHg Patient Gender: F               HR:           81 bpm. Exam Location:  [facility] Procedure: 2D Echo, Cardiac Doppler and Color Doppler Indications:    Bacteremia R78.81  History:        Patient has no prior history of Echocardiogram examinations.                 Risk Factors:Hypertension, Dyslipidemia and Sleep Apnea.  Sonographer:    Aida Pizza RCS Referring Phys: [id number] [facility] IMPRESSIONS  1. Left ventricular ejection fraction, by estimation, is 60 to 65%. The left ventricle has normal function. The left ventricle has no regional wall motion abnormalities. Left ventricular diastolic parameters are indeterminate.  2. Right ventricular systolic function is normal. The right ventricular size is normal.  3. The mitral valve is abnormal. Mild mitral valve regurgitation. No evidence of mitral stenosis.  4. The aortic valve was not well visualized. Aortic valve regurgitation is not visualized. No aortic stenosis is present.  5. The inferior vena cava is normal in size with greater than 50% respiratory variability, suggesting right atrial pressure of 3 mmHg. FINDINGS  Left Ventricle: Left ventricular ejection fraction, by estimation, is 60 to 65%. The left ventricle has normal function. The left ventricle has no regional wall motion abnormalities. The left ventricular internal cavity size was normal in size. There is  no left ventricular hypertrophy. Left ventricular diastolic parameters are indeterminate. Right Ventricle: The right ventricular size is normal. Right vetricular wall thickness was not well visualized. Right ventricular systolic function is normal. Left Atrium: Left atrial size was normal in size.  Right Atrium: Right atrial size was normal in size. Pericardium: There is no evidence of pericardial effusion. Mitral Valve: The mitral valve is abnormal. Mild mitral valve regurgitation. No evidence of mitral valve stenosis. Tricuspid Valve: The tricuspid valve is normal in structure. Tricuspid valve regurgitation is not  demonstrated. No evidence of tricuspid stenosis. Aortic Valve: The aortic valve was not well visualized. Aortic valve regurgitation is not visualized. No aortic stenosis is present. Aortic valve mean gradient measures 5.1 mmHg. Aortic valve peak gradient measures 9.4 mmHg. Aortic valve area, by VTI measures 2.35 cm. Pulmonic Valve: The pulmonic valve was not well visualized. Pulmonic valve regurgitation is not visualized. No evidence of pulmonic stenosis. Aorta: The aortic root is normal in size and structure. Venous: The inferior vena cava is normal in size with greater than 50% respiratory variability, suggesting right atrial pressure of 3 mmHg. IAS/Shunts: No atrial level shunt detected by color flow Doppler.  LEFT VENTRICLE PLAX 2D LVIDd:         5.20 cm   Diastology LVIDs:         3.20 cm   LV e' medial:    8.59 cm/s LV PW:         0.90 cm   LV E/e' medial:  16.4 LV IVS:        0.90 cm   LV e' lateral:   7.51 cm/s LVOT diam:     2.00 cm   LV E/e' lateral: 18.8 LV SV:         77 LV SV Index:   35 LVOT Area:     3.14 cm  RIGHT VENTRICLE RV S prime:     16.85 cm/s TAPSE (M-mode): 2.5 cm LEFT ATRIUM             Index        RIGHT ATRIUM           Index LA diam:        4.20 cm 1.89 cm/m   RA Area:     14.90 cm LA Vol (A2C):   65.1 ml 29.36 ml/m  RA Volume:   36.80 ml  16.60 ml/m LA Vol (A4C):   64.3 ml 29.00 ml/m LA Biplane Vol: 65.0 ml 29.31 ml/m  AORTIC VALVE AV Area (Vmax):    2.48 cm AV Area (Vmean):   2.39 cm AV Area (VTI):     2.35 cm AV Vmax:           153.11 cm/s AV Vmean:          107.476 cm/s AV VTI:            0.329 m AV Peak Grad:      9.4 mmHg AV Mean Grad:      5.1 mmHg LVOT Vmax:         121.00 cm/s LVOT Vmean:        81.800 cm/s LVOT VTI:          0.246 m LVOT/AV VTI ratio: 0.75  AORTA Ao Root diam: 3.20 cm MITRAL VALVE MV Area (PHT): 5.13 cm     SHUNTS MV Decel Time: 148 msec     Systemic VTI:  0.25 m MV E velocity: 141.00 cm/s  Systemic Diam: 2.00 cm MV A velocity: 120.00 cm/s MV E/A  ratio:  1.18 Dorn Ross MD Electronically signed by Dorn Ross MD Signature Date/Time: 03/30/2023/3:10:55 PM    Final    MR THORACIC SPINE W WO CONTRAST Result Date: 03/30/2023 CLINICAL DATA:  Mid back pain with infection suspected EXAM: MRI THORACIC WITHOUT AND WITH CONTRAST TECHNIQUE: Multiplanar and multiecho pulse sequences of the thoracic spine were obtained without and with intravenous contrast. CONTRAST:  10mL GADAVIST  GADOBUTROL  1 MMOL/ML IV SOLN COMPARISON:  Lumbar MRI from earlier today. FINDINGS: Alignment:  Normal Vertebrae: Marrow edema at the right T11-12 facet is better seen on prior lumbar MRI. There is regional edema in the back musculature with posterior epidural collection at T11-12 measuring 18 x 7 mm with posterior cord contact and crowding. No discitis or focal bone lesion. A small collection is also seen posterior to the right facet of T11-12, measuring 8 mm on axial postcontrast images. Cord: Crowding from behind at the level of epidural abscess. No cord edema. Paraspinal and other soft tissues: As above Disc levels: Mild spondylitic and facet spurring. No bony impingement in the thoracic spine. IMPRESSION: Confirmed posterior epidural collection at T11-12 with mass effect on the posterior cord, consistent with abscess in this setting and measuring 18 x 7 mm. 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 Electronically Signed   By: Dorn Roulette M.D.   On: 03/30/2023 08:59   MR LUMBAR SPINE WO CONTRAST Result Date: 03/30/2023 CLINICAL DATA:  Lumbar radiculopathy with infection suspected. Bacteremia, intrathecal lumbar steroid injection with lower back pain. EXAM: MRI LUMBAR SPINE WITHOUT CONTRAST TECHNIQUE: Multiplanar, multisequence MR imaging of the lumbar spine was performed. No intravenous contrast was administered. COMPARISON:  02/23/2021 FINDINGS: Segmentation:  Standard. Alignment:   Physiologic. Vertebrae: Marrow edema at the right T11-12 facet. There is a posterior epidural collection at T11-12 only seen on sagittal imaging, 18 x 8 mm on sagittal STIR images, suspicious for abscess in this clinical setting and with the adjacent facet arthritis that may be septic. Conus medullaris and cauda equina: Conus extends to the L1-2 level. Conus and cauda equina appear normal. Paraspinal and other soft tissues: Soft tissue edema around the inflamed right T11-12 facet. Disc levels: T12- L1: Unremarkable. L1-L2: Minor disc bulging L2-L3: Disc height loss and bulging with small central protrusion. Negative facets L3-L4: Disc narrowing and bulging with right paracentral upward pointing herniation. Degenerative facet spurring ligamentum flavum thickening. Moderate spinal stenosis. L4-L5: Disc narrowing and bulging with left foraminal protrusion. Asymmetric left facet spurring. Moderate left foraminal stenosis. L5-S1:Disc narrowing and bulging with left paracentral protrusion impinging on the left S1 nerve root. Mild left foraminal stenosis. Prelim sent in epic chat. IMPRESSION: 1. Posterior epidural collection at T11-12, suspicious for epidural abscess in this clinical setting and with adjacent right T11-12 facet arthritis. Recommend thoracic MRI with and without contrast. 2. Lumbar spine degeneration as described. Moderate spinal stenosis at L3-4. Electronically Signed   By: Dorn Roulette M.D.   On: 03/30/2023 07:16   DG Chest Portable 1 View Result Date: 03/29/2023 CLINICAL DATA:  Known positive blood cultures EXAM: PORTABLE CHEST 1 VIEW COMPARISON:  CT from earlier in the same day. FINDINGS: Cardiac shadow is within normal limits. Mild vascular congestion is again seen. No focal confluent infiltrate is noted. No bony abnormality is seen. Surgical changes are noted in the cervical spine. IMPRESSION: Stable vascular congestion.  No focal infiltrate is seen. Electronically Signed   By: Oneil Devonshire M.D.    On: 03/29/2023 18:40   CT CHEST ABDOMEN PELVIS W CONTRAST Result Date: 03/29/2023 CLINICAL DATA:  Sepsis. Shortness of breath. Patient reports recent fall. EXAM: CT CHEST, ABDOMEN, AND PELVIS WITH CONTRAST  TECHNIQUE: Multidetector CT imaging of the chest, abdomen and pelvis was performed following the standard protocol during bolus administration of intravenous contrast. RADIATION DOSE REDUCTION: This exam was performed according to the departmental dose-optimization program which includes automated exposure control, adjustment of the mA and/or kV according to patient size and/or use of iterative reconstruction technique. CONTRAST:  OMNIPAQUE  IOHEXOL  300 MG/ML  SOLN COMPARISON:  Chest radiograph yesterday FINDINGS: CT CHEST FINDINGS Cardiovascular: Normal caliber thoracic aorta heart is normal in size. Trace pericardial effusion. No central pulmonary embolus on this exam not tailored to pulmonary arteries ses mint. Mediastinum/Nodes: Cervical prevertebral soft tissue fluid and edema extends into the upper thorax posterior to the esophagus. There is no esophageal wall thickening or inflammation. No mediastinal or hilar adenopathy. No thyroid  nodule. Lungs/Pleura: Trace pleural effusions. Dependent opacities in the right greater than left lower lobe. Minimal ill-defined patchy opacity in the lingula. Benign calcified granuloma in the right upper lobe. Musculoskeletal: There are no acute or suspicious osseous abnormalities. CT ABDOMEN PELVIS FINDINGS Hepatobiliary: The liver is enlarged spanning 21.7 cm cranial caudal. Diffuse hepatic steatosis. Marked nodular contours suspicious for cirrhosis. No focal liver abnormality. Gallbladder physiologically distended, no calcified stone. No biliary dilatation. Pancreas: No ductal dilatation or inflammation. Spleen: Enlarged, 15.1 cm AP.  No focal abnormality. Adrenals/Urinary Tract: Normal adrenal glands. No hydronephrosis. No evidence of focal renal abnormality or  stone. Excreted contrast in the urinary bladder, equivocal bladder wall thickening. Stomach/Bowel: Lack of enteric contrast and motion limits bowel assessment patulous distal esophagus. Scattered fluid within nondilated small bowel. There may be mild small bowel wall thickening and edema in the central small bowel, for example coronal series 4, image 71. Mixed liquid and solid stool in the colon. No pericolonic edema. Vascular/Lymphatic: Aortic atherosclerosis without aneurysm. The portal vein is patent. Periportal adenopathy including a 12 mm porta hepatis node series 4, image 59. Reproductive: Retroverted uterus.  No adnexal mass. Other: No ascites or free air. Small fat containing umbilical hernia. Musculoskeletal: Lower lumbar facet hypertrophy. There are no acute or suspicious osseous abnormalities. IMPRESSION: 1. Trace pleural effusions with dependent opacities in the right greater than left lower lobes, likely atelectasis. Minimal ill-defined patchy opacity in the lingula, may be atelectasis or pneumonia. 2. Hepatomegaly with hepatic steatosis. Nodular contours suspicious for cirrhosis. Splenomegaly. 3. Periportal adenopathy, likely reactive. 4. Possible mild small bowel wall thickening and edema in the central small bowel, can be seen with enteritis. Mixed liquid and solid stool in the. 5. Cervical prevertebral soft tissue fluid and edema extends into the upper thorax posterior to the esophagus at the thoracic inlet. Etiology is indeterminate. Aortic Atherosclerosis (ICD10-I70.0). Electronically Signed   By: Andrea Gasman M.D.   On: 03/29/2023 03:21   MR Cervical Spine W and Wo Contrast Result Date: 03/29/2023 CLINICAL DATA:  Prevertebral edema EXAM: MRI CERVICAL SPINE WITHOUT AND WITH CONTRAST TECHNIQUE: Multiplanar and multiecho pulse sequences of the cervical spine, to include the craniocervical junction and cervicothoracic junction, were obtained without and with intravenous contrast. CONTRAST:   10mL GADAVIST  GADOBUTROL  1 MMOL/ML IV SOLN COMPARISON:  11/24/2020 FINDINGS: Alignment: Physiologic. Vertebrae: No fracture, evidence of discitis, or bone lesion. C4-7 ACDF. No evidence of ligamentous injury. Cord: Normal signal and morphology. Posterior Fossa, vertebral arteries, paraspinal tissues: Prevertebral effusion extending from the skull base inferiorly to at least T4. Maximum thickness is approximately 13 mm at C3. Disc levels: C1-2: Unremarkable. C2-3: Normal disc space and facet joints. There is no spinal canal stenosis. No neural  foraminal stenosis. C3-4: Left-greater-than-right facet hypertrophy and mild uncovertebral spurring. There is no spinal canal stenosis. Mild right moderate left neural foraminal stenosis. C4-5: ACDF. There is no spinal canal stenosis. No neural foraminal stenosis. C5-6: ACDF. There is no spinal canal stenosis. No neural foraminal stenosis. C6-7: ACDF. There is no spinal canal stenosis. No neural foraminal stenosis. C7-T1: Normal disc space and facet joints. There is no spinal canal stenosis. No neural foraminal stenosis. IMPRESSION: 1. Prevertebral effusion extending from the skull base inferiorly to at least T4. Maximum thickness is approximately 13 mm at C3. The etiology remains unclear. 2. C4-7 ACDF without residual spinal canal or neural foraminal stenosis. 3. Mild right and moderate left C3-4 neural foraminal stenosis. Electronically Signed   By: Franky Stanford M.D.   On: 03/29/2023 00:40   CT Head Wo Contrast Result Date: 03/28/2023 CLINICAL DATA:  Head trauma EXAM: CT HEAD WITHOUT CONTRAST CT CERVICAL SPINE WITHOUT CONTRAST TECHNIQUE: Multidetector CT imaging of the head and cervical spine was performed following the standard protocol without intravenous contrast. Multiplanar CT image reconstructions of the cervical spine were also generated. RADIATION DOSE REDUCTION: This exam was performed according to the departmental dose-optimization program which includes  automated exposure control, adjustment of the mA and/or kV according to patient size and/or use of iterative reconstruction technique. COMPARISON:  01/06/2022 FINDINGS: CT HEAD FINDINGS Brain: No mass,hemorrhage or extra-axial collection. Normal appearance of the parenchyma and CSF spaces. Vascular: No hyperdense vessel or unexpected vascular calcification. Skull: The visualized skull base, calvarium and extracranial soft tissues are normal. Sinuses/Orbits: No fluid levels or advanced mucosal thickening of the visualized paranasal sinuses. No mastoid or middle ear effusion. Normal orbits. Other: None. CT CERVICAL SPINE FINDINGS Alignment: No static subluxation. Facets are aligned. Occipital condyles are normally positioned. Skull base and vertebrae: No acute fracture. Soft tissues and spinal canal: There is marked thickening of the prevertebral soft tissues, measuring 15 mm at the C3 level, previously 5 mm. Disc levels: No advanced spinal canal or neural foraminal stenosis. Upper chest: No pneumothorax, pulmonary nodule or pleural effusion. Other: Normal visualized paraspinal cervical soft tissues. IMPRESSION: 1. No acute intracranial abnormality. 2. No acute fracture or static subluxation of the cervical spine. 3. Marked thickening of the prevertebral soft tissues, measuring 15 mm at the C3 level, previously 5 mm. The source or the swelling is unclear. MRI may be helpful to assess for a cervical ligamentous injury. Electronically Signed   By: Franky Stanford M.D.   On: 03/28/2023 21:34   CT Cervical Spine Wo Contrast Result Date: 03/28/2023 CLINICAL DATA:  Head trauma EXAM: CT HEAD WITHOUT CONTRAST CT CERVICAL SPINE WITHOUT CONTRAST TECHNIQUE: Multidetector CT imaging of the head and cervical spine was performed following the standard protocol without intravenous contrast. Multiplanar CT image reconstructions of the cervical spine were also generated. RADIATION DOSE REDUCTION: This exam was performed according to  the departmental dose-optimization program which includes automated exposure control, adjustment of the mA and/or kV according to patient size and/or use of iterative reconstruction technique. COMPARISON:  01/06/2022 FINDINGS: CT HEAD FINDINGS Brain: No mass,hemorrhage or extra-axial collection. Normal appearance of the parenchyma and CSF spaces. Vascular: No hyperdense vessel or unexpected vascular calcification. Skull: The visualized skull base, calvarium and extracranial soft tissues are normal. Sinuses/Orbits: No fluid levels or advanced mucosal thickening of the visualized paranasal sinuses. No mastoid or middle ear effusion. Normal orbits. Other: None. CT CERVICAL SPINE FINDINGS Alignment: No static subluxation. Facets are aligned. Occipital condyles are normally positioned.  Skull base and vertebrae: No acute fracture. Soft tissues and spinal canal: There is marked thickening of the prevertebral soft tissues, measuring 15 mm at the C3 level, previously 5 mm. Disc levels: No advanced spinal canal or neural foraminal stenosis. Upper chest: No pneumothorax, pulmonary nodule or pleural effusion. Other: Normal visualized paraspinal cervical soft tissues. IMPRESSION: 1. No acute intracranial abnormality. 2. No acute fracture or static subluxation of the cervical spine. 3. Marked thickening of the prevertebral soft tissues, measuring 15 mm at the C3 level, previously 5 mm. The source or the swelling is unclear. MRI may be helpful to assess for a cervical ligamentous injury. Electronically Signed   By: Franky Stanford M.D.   On: 03/28/2023 21:34   DG Chest 2 View Result Date: 03/28/2023 CLINICAL DATA:  Chest pain and shortness of breath, initial encounter EXAM: CHEST - 2 VIEW COMPARISON:  06/24/2013 FINDINGS: Cardiac shadow is within normal limits. Mild central vascular congestion is noted without edema. No focal infiltrate or effusion is seen. Postsurgical changes in the cervical spine are noted. IMPRESSION: Mild  vascular congestion. Electronically Signed   By: Oneil Devonshire M.D.   On: 03/28/2023 21:03      Subjective:  Patient seen and examined at bedside today.  Hemodynamically stable.  Comfortable. Denies any back pain.  Medically stable for discharge.  Discharge Exam: Vitals:   04/05/23 2229 04/06/23 0847  BP: 111/68 (!) 158/66  Pulse: 87 82  Resp:    Temp: 98.3 F (36.8 C) 98.7 F (37.1 C)  SpO2: 98% 100%   Vitals:   04/05/23 0902 04/05/23 1744 04/05/23 2229 04/06/23 0847  BP: 127/60 127/74 111/68 (!) 158/66  Pulse: 92 86 87 82  Resp: 18 18    Temp: 98 F (36.7 C) 98.2 F (36.8 C) 98.3 F (36.8 C) 98.7 F (37.1 C)  TempSrc:   Oral Oral  SpO2: 99% 99% 98% 100%  Weight:      Height:        General: Pt is alert, awake, not in acute distress, morbidly obese Cardiovascular: RRR, S1/S2 +, no rubs, no gallops Respiratory: CTA bilaterally, no wheezing, no rhonchi Abdominal: Soft, NT, ND, bowel sounds + Extremities: no edema, no cyanosis    The results of significant diagnostics from this hospitalization (including imaging, microbiology, ancillary and laboratory) are listed below for reference.     Microbiology: Recent Results (from the past 240 hours)  Resp panel by RT-PCR (RSV, Flu A&B, Covid) Anterior Nasal Swab     Status: None   Collection Time: 03/28/23  6:31 PM   Specimen: Anterior Nasal Swab  Result Value Ref Range Status   SARS Coronavirus 2 by RT PCR NEGATIVE NEGATIVE Final    Comment: (NOTE) SARS-CoV-2 target nucleic acids are NOT DETECTED.  The SARS-CoV-2 RNA is generally detectable in upper respiratory specimens during the acute phase of infection. The lowest concentration of SARS-CoV-2 viral copies this assay can detect is 138 copies/mL. A negative result does not preclude SARS-Cov-2 infection and should not be used as the sole basis for treatment or other patient management decisions. A negative result may occur with  improper specimen  collection/handling, submission of specimen other than nasopharyngeal swab, presence of viral mutation(s) within the areas targeted by this assay, and inadequate number of viral copies(<138 copies/mL). A negative result must be combined with clinical observations, patient history, and epidemiological information. The expected result is Negative.  Fact Sheet for Patients:  bloggercourse.com  Fact Sheet for Healthcare Providers:  seriousbroker.it  This test is no t yet approved or cleared by the [place]  FDA and  has been authorized for detection and/or diagnosis of SARS-CoV-2 by FDA under an Emergency Use Authorization (EUA). This EUA will remain  in effect (meaning this test can be used) for the duration of the COVID-19 declaration under Section 564(b)(1) of the Act, 21 U.S.C.section 360bbb-3(b)(1), unless the authorization is terminated  or revoked sooner.       Influenza A by PCR NEGATIVE NEGATIVE Final   Influenza B by PCR NEGATIVE NEGATIVE Final    Comment: (NOTE) The Xpert Xpress SARS-CoV-2/FLU/RSV plus assay is intended as an aid in the diagnosis of influenza from Nasopharyngeal swab specimens and should not be used as a sole basis for treatment. Nasal washings and aspirates are unacceptable for Xpert Xpress SARS-CoV-2/FLU/RSV testing.  Fact Sheet for Patients: bloggercourse.com  Fact Sheet for Healthcare Providers: seriousbroker.it  This test is not yet approved or cleared by the [place]  FDA and has been authorized for detection and/or diagnosis of SARS-CoV-2 by FDA under an Emergency Use Authorization (EUA). This EUA will remain in effect (meaning this test can be used) for the duration of the COVID-19 declaration under Section 564(b)(1) of the Act, 21 U.S.C. section 360bbb-3(b)(1), unless the authorization is terminated or revoked.     Resp Syncytial  Virus by PCR NEGATIVE NEGATIVE Final    Comment: (NOTE) Fact Sheet for Patients: bloggercourse.com  Fact Sheet for Healthcare Providers: seriousbroker.it  This test is not yet approved or cleared by the [place]  FDA and has been authorized for detection and/or diagnosis of SARS-CoV-2 by FDA under an Emergency Use Authorization (EUA). This EUA will remain in effect (meaning this test can be used) for the duration of the COVID-19 declaration under Section 564(b)(1) of the Act, 21 U.S.C. section 360bbb-3(b)(1), unless the authorization is terminated or revoked.  Performed at [facility], [address] [place], [place] [address]   Blood culture (routine x 2)     Status: Abnormal   Collection Time: 03/28/23  8:17 PM   Specimen: Right Antecubital; Blood  Result Value Ref Range Status   Specimen Description   Final    RIGHT ANTECUBITAL Performed at [facility], [address] [place], [place] [address]    Special Requests   Final    BOTTLES DRAWN AEROBIC AND ANAEROBIC Blood Culture results may not be optimal due to an inadequate volume of blood received in culture bottles Performed at [facility], [address] [place], [place] [address]    Culture  Setup Time   Final    AEROBIC BOTTLE ONLY GRAM POSITIVE COCCI Gram Stain Report Called to,Read Back By and Verified With: EMILY GANT @ 1053 ON 03/29/23 C VARNER GRAM STAIN REVIEWED-AGREE WITH RESULT DRT    Culture (A)  Final    STAPHYLOCOCCUS AUREUS SUSCEPTIBILITIES PERFORMED ON PREVIOUS CULTURE WITHIN THE LAST 5 DAYS. Performed at [facility], [address] [place], [place] [address]    Report Status 03/31/2023 FINAL  Final  Blood culture (routine x 2)     Status: Abnormal   Collection Time: 03/28/23  8:53 PM   Specimen: BLOOD LEFT HAND  Result Value Ref Range Status   Specimen Description   Final    BLOOD LEFT HAND Performed at [facility], [address]  [place], [place] [address]    Special Requests   Final    BOTTLES DRAWN AEROBIC AND ANAEROBIC Blood Culture adequate volume Performed at  [facility], [address], [place], [place] [address]    Culture  Setup Time   Final    GRAM POSITIVE COCCI IN BOTH AEROBIC AND ANAEROBIC BOTTLES Gram Stain Report Called to,Read Back By and Verified With: DOSS M @ 1300 ON [id number] BY HENDERSON L GRAM STAIN REVIEWED-AGREE WITH RESULT DRT CRITICAL RESULT CALLED TO, READ BACK BY AND VERIFIED WITH: RN CHRISTY EDWARDS ON 03/29/23 @ 1916 BY DRT Performed at [facility], [address] [place], [place] [address]    Culture STAPHYLOCOCCUS AUREUS (A)  Final   Report Status 03/31/2023 FINAL  Final   Organism ID, Bacteria STAPHYLOCOCCUS AUREUS  Final      Susceptibility   Staphylococcus aureus - MIC*    CIPROFLOXACIN <=0.5 SENSITIVE Sensitive     ERYTHROMYCIN <=0.25 SENSITIVE Sensitive     GENTAMICIN <=0.5 SENSITIVE Sensitive     OXACILLIN 0.5 SENSITIVE Sensitive     TETRACYCLINE <=1 SENSITIVE Sensitive     VANCOMYCIN  1 SENSITIVE Sensitive     TRIMETH/SULFA <=10 SENSITIVE Sensitive     CLINDAMYCIN <=0.25 SENSITIVE Sensitive     RIFAMPIN <=0.5 SENSITIVE Sensitive     Inducible Clindamycin NEGATIVE Sensitive     LINEZOLID 2 SENSITIVE Sensitive     * STAPHYLOCOCCUS AUREUS  Blood Culture ID Panel (Reflexed)     Status: Abnormal   Collection Time: 03/28/23  8:53 PM  Result Value Ref Range Status   Enterococcus faecalis NOT DETECTED NOT DETECTED Final   Enterococcus Faecium NOT DETECTED NOT DETECTED Final   Listeria monocytogenes NOT DETECTED NOT DETECTED Final   Staphylococcus species DETECTED (A) NOT DETECTED Final    Comment: CRITICAL RESULT CALLED TO, READ BACK BY AND VERIFIED WITH: RN CHRISTY EDWARDS ON 03/29/23 @ 1916 BY DRT    Staphylococcus aureus (BCID) DETECTED (A) NOT DETECTED Final    Comment: CRITICAL RESULT CALLED TO, READ BACK BY AND VERIFIED WITH: RN CHRISTY EDWARDS ON 03/29/23 @ 1916 BY DRT     Staphylococcus epidermidis NOT DETECTED NOT DETECTED Final   Staphylococcus lugdunensis NOT DETECTED NOT DETECTED Final   Streptococcus species NOT DETECTED NOT DETECTED Final   Streptococcus agalactiae NOT DETECTED NOT DETECTED Final   Streptococcus pneumoniae NOT DETECTED NOT DETECTED Final   Streptococcus pyogenes NOT DETECTED NOT DETECTED Final   A.calcoaceticus-baumannii NOT DETECTED NOT DETECTED Final   Bacteroides fragilis NOT DETECTED NOT DETECTED Final   Enterobacterales NOT DETECTED NOT DETECTED Final   Enterobacter cloacae complex NOT DETECTED NOT DETECTED Final   Escherichia coli NOT DETECTED NOT DETECTED Final   Klebsiella aerogenes NOT DETECTED NOT DETECTED Final   Klebsiella oxytoca NOT DETECTED NOT DETECTED Final   Klebsiella pneumoniae NOT DETECTED NOT DETECTED Final   Proteus species NOT DETECTED NOT DETECTED Final   Salmonella species NOT DETECTED NOT DETECTED Final   Serratia marcescens NOT DETECTED NOT DETECTED Final   Haemophilus influenzae NOT DETECTED NOT DETECTED Final   Neisseria meningitidis NOT DETECTED NOT DETECTED Final   Pseudomonas aeruginosa NOT DETECTED NOT DETECTED Final   Stenotrophomonas maltophilia NOT DETECTED NOT DETECTED Final   Candida albicans NOT DETECTED NOT DETECTED Final   Candida auris NOT DETECTED NOT DETECTED Final   Candida glabrata NOT DETECTED NOT DETECTED Final   Candida krusei NOT DETECTED NOT DETECTED Final   Candida parapsilosis NOT DETECTED NOT DETECTED Final   Candida tropicalis NOT DETECTED NOT DETECTED Final   Cryptococcus neoformans/gattii NOT DETECTED NOT DETECTED Final   Meth resistant mecA/C and MREJ NOT  DETECTED NOT DETECTED Final    Comment: Performed at [facility], [address], [place], [place] [address]  Culture, blood (routine x 2)     Status: Abnormal   Collection Time: 03/29/23  5:29 PM   Specimen: BLOOD  Result Value Ref Range Status   Specimen Description   Final    BLOOD RFOA Performed at  [facility], [address], [place], [place] [address]    Special Requests   Final    BOTTLES DRAWN AEROBIC AND ANAEROBIC Blood Culture results may not be optimal due to an inadequate volume of blood received in culture bottles Performed at [facility], [address] [place], [place] [address]    Culture  Setup Time   Final    GRAM POSITIVE COCCI ANAEROBIC BOTTLE ONLY Gram Stain Report Called to,Read Back By and Verified With: DOROTHA DEBBY Fletcher OLIVE RAFORD, VIRAY,J Performed at [facility], [address] [place], [place] [address]    Culture STAPHYLOCOCCUS AUREUS (A)  Final   Report Status 04/02/2023 FINAL  Final   Organism ID, Bacteria STAPHYLOCOCCUS AUREUS  Final      Susceptibility   Staphylococcus aureus - MIC*    CIPROFLOXACIN <=0.5 SENSITIVE Sensitive     ERYTHROMYCIN <=0.25 SENSITIVE Sensitive     GENTAMICIN <=0.5 SENSITIVE Sensitive     OXACILLIN 0.5 SENSITIVE Sensitive     TETRACYCLINE <=1 SENSITIVE Sensitive     VANCOMYCIN  1 SENSITIVE Sensitive     TRIMETH/SULFA <=10 SENSITIVE Sensitive     CLINDAMYCIN <=0.25 SENSITIVE Sensitive     RIFAMPIN <=0.5 SENSITIVE Sensitive     Inducible Clindamycin NEGATIVE Sensitive     LINEZOLID 2 SENSITIVE Sensitive     * STAPHYLOCOCCUS AUREUS  Culture, blood (routine x 2)     Status: None   Collection Time: 03/29/23  5:35 PM   Specimen: BLOOD LEFT FOREARM  Result Value Ref Range Status   Specimen Description BLOOD LEFT FOREARM  Final   Special Requests   Final    BOTTLES DRAWN AEROBIC ONLY Blood Culture results may not be optimal due to an inadequate volume of blood received in culture bottles   Culture   Final    NO GROWTH 5 DAYS Performed at [facility], [address] [place], [place] [address]    Report Status 04/03/2023 FINAL  Final  Culture, blood (Routine X 2) w Reflex to ID Panel     Status: None   Collection Time: 04/01/23  2:18 PM   Specimen: BLOOD LEFT HAND  Result Value Ref Range Status   Specimen Description BLOOD  LEFT HAND  Final   Special Requests   Final    BOTTLES DRAWN AEROBIC AND ANAEROBIC Blood Culture results may not be optimal due to an inadequate volume of blood received in culture bottles   Culture   Final    NO GROWTH 5 DAYS Performed at [facility], [address], [place], [place] [address]    Report Status 04/06/2023 FINAL  Final  Culture, blood (Routine X 2) w Reflex to ID Panel     Status: None   Collection Time: 04/01/23  2:18 PM   Specimen: BLOOD RIGHT HAND  Result Value Ref Range Status   Specimen Description BLOOD RIGHT HAND  Final   Special Requests   Final    BOTTLES DRAWN AEROBIC AND ANAEROBIC Blood Culture results may not be optimal due to an inadequate volume of blood received in culture bottles  Culture   Final    NO GROWTH 5 DAYS Performed at [facility], [address] [place], [place] [address]    Report Status 04/06/2023 FINAL  Final     Labs: BNP (last 3 results) No results for input(s): BNP in the last 8760 hours. Basic Metabolic Panel: Recent Labs  Lab 03/31/23 0657 04/01/23 0811 04/03/23 0332 04/04/23 0335 04/05/23 0341  NA 131* 132* 138 141 139  K 3.5 3.5 3.5 3.6 3.6  CL 95* 95* 98 102 99  CO2 22 23 28 27 28   GLUCOSE 283* 144* 122* 119* 120*  BUN 9 8 6 6 7   CREATININE 0.84 0.71 0.65 0.71 0.81  CALCIUM 8.0* 8.4* 8.6* 8.7* 8.9  MG 1.6*  --   --   --   --   PHOS  --   --  3.5 4.1 4.1   Liver Function Tests: Recent Labs  Lab 04/01/23 0811 04/03/23 0332 04/04/23 0335 04/05/23 0341  AST 36  --   --   --   ALT 37  --   --   --   ALKPHOS 94  --   --   --   BILITOT 1.1  --   --   --   PROT 6.9  --   --   --   ALBUMIN 2.9* 2.6* 2.7* 2.9*   No results for input(s): LIPASE, AMYLASE in the last 168 hours. No results for input(s): AMMONIA in the last 168 hours. CBC: Recent Labs  Lab 04/01/23 0811 04/03/23 0332 04/04/23 0335 04/05/23 0341  WBC 12.5* 13.8* 11.2* 11.1*  NEUTROABS 8.0* 9.0* 6.7 6.5  HGB 10.1* 9.8*  9.2* 10.0*  HCT 30.9* 30.0* 28.8* 31.9*  MCV 88.8 91.2 90.9 92.7  PLT 212 258 273 353   Cardiac Enzymes: No results for input(s): CKTOTAL, CKMB, CKMBINDEX, TROPONINI in the last 168 hours. BNP: Invalid input(s): POCBNP CBG: Recent Labs  Lab 04/05/23 0733 04/05/23 1133 04/05/23 1650 04/05/23 2229 04/06/23 0728  GLUCAP 125* 106* 112* 129* 133*   D-Dimer No results for input(s): DDIMER in the last 72 hours. Hgb A1c No results for input(s): HGBA1C in the last 72 hours. Lipid Profile No results for input(s): CHOL, HDL, LDLCALC, TRIG, CHOLHDL, LDLDIRECT in the last 72 hours. Thyroid  function studies No results for input(s): TSH, T4TOTAL, T3FREE, THYROIDAB in the last 72 hours.  Invalid input(s): FREET3 Anemia work up No results for input(s): VITAMINB12, FOLATE, FERRITIN, TIBC, IRON, RETICCTPCT in the last 72 hours. Urinalysis    Component Value Date/Time   COLORURINE YELLOW 03/28/2023 2141   APPEARANCEUR CLEAR 03/28/2023 2141   LABSPEC 1.018 03/28/2023 2141   PHURINE 8.0 03/28/2023 2141   GLUCOSEU NEGATIVE 03/28/2023 2141   HGBUR NEGATIVE 03/28/2023 2141   BILIRUBINUR NEGATIVE 03/28/2023 2141   KETONESUR 20 (A) 03/28/2023 2141   PROTEINUR 100 (A) 03/28/2023 2141   NITRITE NEGATIVE 03/28/2023 2141   LEUKOCYTESUR NEGATIVE 03/28/2023 2141   Sepsis Labs Recent Labs  Lab 04/01/23 0811 04/03/23 0332 04/04/23 0335 04/05/23 0341  WBC 12.5* 13.8* 11.2* 11.1*   Microbiology Recent Results (from the past 240 hours)  Resp panel by RT-PCR (RSV, Flu A&B, Covid) Anterior Nasal Swab     Status: None   Collection Time: 03/28/23  6:31 PM   Specimen: Anterior Nasal Swab  Result Value Ref Range Status   SARS Coronavirus 2 by RT PCR NEGATIVE NEGATIVE Final    Comment: (NOTE) SARS-CoV-2 target nucleic acids are NOT DETECTED.  The  SARS-CoV-2 RNA is generally detectable in upper respiratory specimens during the acute phase of  infection. The lowest concentration of SARS-CoV-2 viral copies this assay can detect is 138 copies/mL. A negative result does not preclude SARS-Cov-2 infection and should not be used as the sole basis for treatment or other patient management decisions. A negative result may occur with  improper specimen collection/handling, submission of specimen other than nasopharyngeal swab, presence of viral mutation(s) within the areas targeted by this assay, and inadequate number of viral copies(<138 copies/mL). A negative result must be combined with clinical observations, patient history, and epidemiological information. The expected result is Negative.  Fact Sheet for Patients:  bloggercourse.com  Fact Sheet for Healthcare Providers:  seriousbroker.it  This test is no t yet approved or cleared by the [place]  FDA and  has been authorized for detection and/or diagnosis of SARS-CoV-2 by FDA under an Emergency Use Authorization (EUA). This EUA will remain  in effect (meaning this test can be used) for the duration of the COVID-19 declaration under Section 564(b)(1) of the Act, 21 U.S.C.section 360bbb-3(b)(1), unless the authorization is terminated  or revoked sooner.       Influenza A by PCR NEGATIVE NEGATIVE Final   Influenza B by PCR NEGATIVE NEGATIVE Final    Comment: (NOTE) The Xpert Xpress SARS-CoV-2/FLU/RSV plus assay is intended as an aid in the diagnosis of influenza from Nasopharyngeal swab specimens and should not be used as a sole basis for treatment. Nasal washings and aspirates are unacceptable for Xpert Xpress SARS-CoV-2/FLU/RSV testing.  Fact Sheet for Patients: bloggercourse.com  Fact Sheet for Healthcare Providers: seriousbroker.it  This test is not yet approved or cleared by the [place]  FDA and has been authorized for detection and/or diagnosis of SARS-CoV-2  by FDA under an Emergency Use Authorization (EUA). This EUA will remain in effect (meaning this test can be used) for the duration of the COVID-19 declaration under Section 564(b)(1) of the Act, 21 U.S.C. section 360bbb-3(b)(1), unless the authorization is terminated or revoked.     Resp Syncytial Virus by PCR NEGATIVE NEGATIVE Final    Comment: (NOTE) Fact Sheet for Patients: bloggercourse.com  Fact Sheet for Healthcare Providers: seriousbroker.it  This test is not yet approved or cleared by the [place]  FDA and has been authorized for detection and/or diagnosis of SARS-CoV-2 by FDA under an Emergency Use Authorization (EUA). This EUA will remain in effect (meaning this test can be used) for the duration of the COVID-19 declaration under Section 564(b)(1) of the Act, 21 U.S.C. section 360bbb-3(b)(1), unless the authorization is terminated or revoked.  Performed at [facility], [address] [place], [place] [address]   Blood culture (routine x 2)     Status: Abnormal   Collection Time: 03/28/23  8:17 PM   Specimen: Right Antecubital; Blood  Result Value Ref Range Status   Specimen Description   Final    RIGHT ANTECUBITAL Performed at [facility], [address] [place], [place] [address]    Special Requests   Final    BOTTLES DRAWN AEROBIC AND ANAEROBIC Blood Culture results may not be optimal due to an inadequate volume of blood received in culture bottles Performed at [facility], [address], [place], [place] [address]    Culture  Setup Time   Final    AEROBIC BOTTLE ONLY GRAM POSITIVE COCCI Gram Stain Report Called to,Read Back By and Verified With: EMILY GANT @ 1053 ON 03/29/23 C VARNER GRAM STAIN REVIEWED-AGREE WITH RESULT DRT  Culture (A)  Final    STAPHYLOCOCCUS AUREUS SUSCEPTIBILITIES PERFORMED ON PREVIOUS CULTURE WITHIN THE LAST 5 DAYS. Performed at [facility], [address]  [place], [place] [address]    Report Status 03/31/2023 FINAL  Final  Blood culture (routine x 2)     Status: Abnormal   Collection Time: 03/28/23  8:53 PM   Specimen: BLOOD LEFT HAND  Result Value Ref Range Status   Specimen Description   Final    BLOOD LEFT HAND Performed at [facility], [address] [place], [place] [address]    Special Requests   Final    BOTTLES DRAWN AEROBIC AND ANAEROBIC Blood Culture adequate volume Performed at [facility], [address] [place], [place] [address]    Culture  Setup Time   Final    GRAM POSITIVE COCCI IN BOTH AEROBIC AND ANAEROBIC BOTTLES Gram Stain Report Called to,Read Back By and Verified With: DOSS M @ 1300 ON [id number] BY HENDERSON L GRAM STAIN REVIEWED-AGREE WITH RESULT DRT CRITICAL RESULT CALLED TO, READ BACK BY AND VERIFIED WITH: RN CHRISTY EDWARDS ON 03/29/23 @ 1916 BY DRT Performed at [facility], [address] [place], [place] [address]    Culture STAPHYLOCOCCUS AUREUS (A)  Final   Report Status 03/31/2023 FINAL  Final   Organism ID, Bacteria STAPHYLOCOCCUS AUREUS  Final      Susceptibility   Staphylococcus aureus - MIC*    CIPROFLOXACIN <=0.5 SENSITIVE Sensitive     ERYTHROMYCIN <=0.25 SENSITIVE Sensitive     GENTAMICIN <=0.5 SENSITIVE Sensitive     OXACILLIN 0.5 SENSITIVE Sensitive     TETRACYCLINE <=1 SENSITIVE Sensitive     VANCOMYCIN  1 SENSITIVE Sensitive     TRIMETH/SULFA <=10 SENSITIVE Sensitive     CLINDAMYCIN <=0.25 SENSITIVE Sensitive     RIFAMPIN <=0.5 SENSITIVE Sensitive     Inducible Clindamycin NEGATIVE Sensitive     LINEZOLID 2 SENSITIVE Sensitive     * STAPHYLOCOCCUS AUREUS  Blood Culture ID Panel (Reflexed)     Status: Abnormal   Collection Time: 03/28/23  8:53 PM  Result Value Ref Range Status   Enterococcus faecalis NOT DETECTED NOT DETECTED Final   Enterococcus Faecium NOT DETECTED NOT DETECTED Final   Listeria monocytogenes NOT DETECTED NOT DETECTED Final   Staphylococcus species DETECTED (A) NOT  DETECTED Final    Comment: CRITICAL RESULT CALLED TO, READ BACK BY AND VERIFIED WITH: RN CHRISTY EDWARDS ON 03/29/23 @ 1916 BY DRT    Staphylococcus aureus (BCID) DETECTED (A) NOT DETECTED Final    Comment: CRITICAL RESULT CALLED TO, READ BACK BY AND VERIFIED WITH: RN CHRISTY EDWARDS ON 03/29/23 @ 1916 BY DRT    Staphylococcus epidermidis NOT DETECTED NOT DETECTED Final   Staphylococcus lugdunensis NOT DETECTED NOT DETECTED Final   Streptococcus species NOT DETECTED NOT DETECTED Final   Streptococcus agalactiae NOT DETECTED NOT DETECTED Final   Streptococcus pneumoniae NOT DETECTED NOT DETECTED Final   Streptococcus pyogenes NOT DETECTED NOT DETECTED Final   A.calcoaceticus-baumannii NOT DETECTED NOT DETECTED Final   Bacteroides fragilis NOT DETECTED NOT DETECTED Final   Enterobacterales NOT DETECTED NOT DETECTED Final   Enterobacter cloacae complex NOT DETECTED NOT DETECTED Final   Escherichia coli NOT DETECTED NOT DETECTED Final   Klebsiella aerogenes NOT DETECTED NOT DETECTED Final   Klebsiella oxytoca NOT DETECTED NOT DETECTED Final   Klebsiella pneumoniae NOT DETECTED NOT DETECTED Final   Proteus species NOT DETECTED NOT DETECTED Final   Salmonella species NOT  DETECTED NOT DETECTED Final   Serratia marcescens NOT DETECTED NOT DETECTED Final   Haemophilus influenzae NOT DETECTED NOT DETECTED Final   Neisseria meningitidis NOT DETECTED NOT DETECTED Final   Pseudomonas aeruginosa NOT DETECTED NOT DETECTED Final   Stenotrophomonas maltophilia NOT DETECTED NOT DETECTED Final   Candida albicans NOT DETECTED NOT DETECTED Final   Candida auris NOT DETECTED NOT DETECTED Final   Candida glabrata NOT DETECTED NOT DETECTED Final   Candida krusei NOT DETECTED NOT DETECTED Final   Candida parapsilosis NOT DETECTED NOT DETECTED Final   Candida tropicalis NOT DETECTED NOT DETECTED Final   Cryptococcus neoformans/gattii NOT DETECTED NOT DETECTED Final   Meth resistant mecA/C and MREJ NOT DETECTED  NOT DETECTED Final    Comment: Performed at [facility], [address] [place], [place] [address]  Culture, blood (routine x 2)     Status: Abnormal   Collection Time: 03/29/23  5:29 PM   Specimen: BLOOD  Result Value Ref Range Status   Specimen Description   Final    BLOOD RFOA Performed at [facility], [address], [place], [place] [address]    Special Requests   Final    BOTTLES DRAWN AEROBIC AND ANAEROBIC Blood Culture results may not be optimal due to an inadequate volume of blood received in culture bottles Performed at [facility], [address], [place], [place] [address]    Culture  Setup Time   Final    GRAM POSITIVE COCCI ANAEROBIC BOTTLE ONLY Gram Stain Report Called to,Read Back By and Verified With: DOROTHA NED @[facility]  [phone], VIRAY,J Performed at [facility], [address] [place], [place] [address]    Culture STAPHYLOCOCCUS AUREUS (A)  Final   Report Status 04/02/2023 FINAL  Final   Organism ID, Bacteria STAPHYLOCOCCUS AUREUS  Final      Susceptibility   Staphylococcus aureus - MIC*    CIPROFLOXACIN <=0.5 SENSITIVE Sensitive     ERYTHROMYCIN <=0.25 SENSITIVE Sensitive     GENTAMICIN <=0.5 SENSITIVE Sensitive     OXACILLIN 0.5 SENSITIVE Sensitive     TETRACYCLINE <=1 SENSITIVE Sensitive     VANCOMYCIN  1 SENSITIVE Sensitive     TRIMETH/SULFA <=10 SENSITIVE Sensitive     CLINDAMYCIN <=0.25 SENSITIVE Sensitive     RIFAMPIN <=0.5 SENSITIVE Sensitive     Inducible Clindamycin NEGATIVE Sensitive     LINEZOLID 2 SENSITIVE Sensitive     * STAPHYLOCOCCUS AUREUS  Culture, blood (routine x 2)     Status: None   Collection Time: 03/29/23  5:35 PM   Specimen: BLOOD LEFT FOREARM  Result Value Ref Range Status   Specimen Description BLOOD LEFT FOREARM  Final   Special Requests   Final    BOTTLES DRAWN AEROBIC ONLY Blood Culture results may not be optimal due to an inadequate volume of blood received in culture bottles   Culture   Final    NO GROWTH 5  DAYS Performed at [facility], [address], [place], [place] [address]    Report Status 04/03/2023 FINAL  Final  Culture, blood (Routine X 2) w Reflex to ID Panel     Status: None   Collection Time: 04/01/23  2:18 PM   Specimen: BLOOD LEFT HAND  Result Value Ref Range Status   Specimen Description BLOOD LEFT HAND  Final   Special Requests   Final    BOTTLES DRAWN AEROBIC AND ANAEROBIC Blood Culture results may not be optimal due to an inadequate volume of blood  received in culture bottles   Culture   Final    NO GROWTH 5 DAYS Performed at [facility], [address] [place], [place] [address]    Report Status 04/06/2023 FINAL  Final  Culture, blood (Routine X 2) w Reflex to ID Panel     Status: None   Collection Time: 04/01/23  2:18 PM   Specimen: BLOOD RIGHT HAND  Result Value Ref Range Status   Specimen Description BLOOD RIGHT HAND  Final   Special Requests   Final    BOTTLES DRAWN AEROBIC AND ANAEROBIC Blood Culture results may not be optimal due to an inadequate volume of blood received in culture bottles   Culture   Final    NO GROWTH 5 DAYS Performed at [facility], [address] [place], [place] [address]    Report Status 04/06/2023 FINAL  Final    Please note: You were cared for by a hospitalist during your hospital stay. Once you are discharged, your primary care physician will handle any further medical issues. Please note that NO REFILLS for any discharge medications will be authorized once you are discharged, as it is imperative that you return to your primary care physician (or establish a relationship with a primary care physician if you do not have one) for your post hospital discharge needs so that they can reassess your need for medications and monitor your lab values.    Time coordinating discharge: 40 minutes  SIGNED:   Ivonne Mustache, MD  [facility] 04/06/2023, 10:41 AM Pager [phone]  If 7PM-7AM, please contact  night-coverage [email or web address] Password [email or web address]

## 2023-04-12 ENCOUNTER — Telehealth: Payer: Self-pay

## 2023-04-12 ENCOUNTER — Ambulatory Visit: Payer: Commercial Managed Care - PPO | Admitting: Internal Medicine

## 2023-04-12 ENCOUNTER — Other Ambulatory Visit: Payer: Self-pay

## 2023-04-12 ENCOUNTER — Encounter: Payer: Self-pay | Admitting: Internal Medicine

## 2023-04-12 VITALS — BP 126/56 | HR 102 | Resp 16 | Ht 63.0 in | Wt 252.0 lb

## 2023-04-12 DIAGNOSIS — B9561 Methicillin susceptible Staphylococcus aureus infection as the cause of diseases classified elsewhere: Secondary | ICD-10-CM

## 2023-04-12 DIAGNOSIS — F32A Depression, unspecified: Secondary | ICD-10-CM | POA: Insufficient documentation

## 2023-04-12 DIAGNOSIS — Z79899 Other long term (current) drug therapy: Secondary | ICD-10-CM

## 2023-04-12 DIAGNOSIS — M4643 Discitis, unspecified, cervicothoracic region: Secondary | ICD-10-CM | POA: Diagnosis not present

## 2023-04-12 DIAGNOSIS — M810 Age-related osteoporosis without current pathological fracture: Secondary | ICD-10-CM | POA: Insufficient documentation

## 2023-04-12 DIAGNOSIS — M1990 Unspecified osteoarthritis, unspecified site: Secondary | ICD-10-CM | POA: Insufficient documentation

## 2023-04-12 DIAGNOSIS — M797 Fibromyalgia: Secondary | ICD-10-CM | POA: Insufficient documentation

## 2023-04-12 DIAGNOSIS — F419 Anxiety disorder, unspecified: Secondary | ICD-10-CM | POA: Insufficient documentation

## 2023-04-12 DIAGNOSIS — B3731 Acute candidiasis of vulva and vagina: Secondary | ICD-10-CM | POA: Diagnosis not present

## 2023-04-12 DIAGNOSIS — M5416 Radiculopathy, lumbar region: Secondary | ICD-10-CM | POA: Insufficient documentation

## 2023-04-12 DIAGNOSIS — E039 Hypothyroidism, unspecified: Secondary | ICD-10-CM | POA: Insufficient documentation

## 2023-04-12 MED ORDER — FLUCONAZOLE 150 MG PO TABS: 150.0000 mg | ORAL_TABLET | ORAL | 0 refills | Status: DC | PRN

## 2023-04-12 NOTE — Progress Notes (Signed)
Labs drawn via PICC line per Dr. Drue Second.  PICC dressing changed. Extension tubing connected to aid in self-administration of IV antibiotics. Neutral pressure cap changed. Connections tightened. Line flushed with 10 mL normal saline and heparin locked. Line clamped. Patient tolerated well.   Sandie Ano, RN

## 2023-04-12 NOTE — Telephone Encounter (Signed)
 I have sent message to Kay Parson asking that she look into [facility] issue with this patient. She has not heard from [facility] nurse and is on IV abx. Was discharged last week. Pam will look into this.

## 2023-04-12 NOTE — Progress Notes (Signed)
Patient ID: Debbie Goodwin, female   DOB: [birth year], 53 y.o.   MRN: [id number]  HPI Used AI abridged dictation, with patient approval  The patient, with a history of chronic back pain managed with regular epidural injections, was recently hospitalized for a Staphylococcus aureus bloodstream infection and an epidural abscess. The last epidural injection was received in early October. The patient reported a decline in general health starting in December, with multiple falls and persistent complaints of feeling unwell. The most recent fall resulted in significant back trauma. Shortly after, the patient developed a high-grade fever, leading to hospital admission, where she was found to have MSSA epidural abscess. She had picc line placed on 04/06/23.  Since discharge, the patient has been managing well with a PICC line, receiving thrice-daily infusions administered by family members. However, she reported the onset of a yeast infection a few days after starting the antibiotic regimen, which was managed with Diflucan.  The patient also reported persistent weakness in the legs, with difficulty walking and climbing stairs. She described a disconnect between her brain and her walking, suggesting possible neurological involvement. The back pain also continues to be a problem. Despite these issues, there has been no worsening of symptoms since discharge.  The patient is scheduled for a follow-up with the surgeon who previously performed her neck surgery towards the end of February. The patient's pain management plan for her chronic back pain will need to be reassessed in light of the recent infection.  Outpatient Encounter Medications as of 04/12/2023  Medication Sig   ALPRAZolam (XANAX) 0.25 MG tablet Take 0.25 mg by mouth 3 (three) times daily as needed for anxiety.   amitriptyline (ELAVIL) 25 MG tablet Take 25-50 mg by mouth at bedtime.   betamethasone dipropionate 0.05 % cream Apply 1 Application  topically 2 (two) times daily as needed (rash).   Blood Glucose Monitoring Suppl DEVI 1 each by Does not apply route in the morning, at noon, and at bedtime. May substitute to any manufacturer covered by patient's insurance.   buPROPion (WELLBUTRIN XL) 300 MG 24 hr tablet Take 300 mg by mouth daily.   ceFAZolin (ANCEF) IVPB Inject 2 g into the vein every 8 (eight) hours. Indication:  MSSA epidural abscess First Dose: Yes Last Day of Therapy:  05/13/23 Labs - Once weekly:  CBC/D and BMP, Labs - Once weekly: ESR and CRP Method of administration: IV Push Method of administration may be changed at the discretion of home infusion pharmacist based upon assessment of the patient and/or caregiver's ability to self-administer the medication ordered.   Cholecalciferol (VITAMIN D-3 PO) Take 1 capsule by mouth daily.   cyclobenzaprine (FLEXERIL) 10 MG tablet Take 10 mg by mouth 3 (three) times daily as needed for muscle spasms.   DULoxetine (CYMBALTA) 60 MG capsule Take 60 mg by mouth daily.   famotidine (PEPCID) 20 MG tablet Take 1 tablet (20 mg total) by mouth at bedtime.   Flaxseed, Linseed, (FLAXSEED OIL PO) Take 1 capsule by mouth daily.   fluconazole (DIFLUCAN) 150 MG tablet Take 1 tablet (150 mg total) by mouth every three (3) days as needed (yeast infection treatment/prevention).   gabapentin (NEURONTIN) 300 MG capsule Take 900 mg by mouth daily.   ibuprofen (ADVIL,MOTRIN) 200 MG tablet Take 800 mg by mouth every 6 (six) hours as needed for moderate pain.   ketoconazole (NIZORAL) 2 % cream Apply topically 2 (two) times daily.   levothyroxine (SYNTHROID) 100 MCG tablet Take 100 mcg by  mouth daily.   lidocaine (LIDODERM) 5 % Place 1 patch onto the skin daily. Remove & Discard patch within 12 hours or as directed by MD   metFORMIN (GLUCOPHAGE) 1000 MG tablet Take 1,000 mg by mouth 2 (two) times daily.   mometasone (ELOCON) 0.1 % ointment Apply 1 Application topically daily.   olmesartan (BENICAR) 40  MG tablet Take 40 mg by mouth daily.   Omega-3 Fatty Acids (FISH OIL) 300 MG CAPS Take 1 capsule by mouth daily.   Oxycodone HCl 10 MG TABS Take 1 tablet (10 mg total) by mouth every 6 (six) hours as needed (pain).   pantoprazole (PROTONIX) 40 MG tablet Take 40 mg by mouth daily.   polyvinyl alcohol (LIQUIFILM TEARS) 1.4 % ophthalmic solution Place 1 drop into both eyes as needed for dry eyes.   rOPINIRole (REQUIP) 1 MG tablet Take 1 mg by mouth as needed (restless legs).   rOPINIRole (REQUIP) 3 MG tablet Take 3 mg by mouth daily.   simvastatin (ZOCOR) 40 MG tablet Take 40 mg by mouth daily.   Zinc Acetate, Oral, (ZINC ACETATE PO) Take 1 tablet by mouth daily.   methylPREDNISolone (MEDROL DOSEPAK) 4 MG TBPK tablet Take 4 mg by mouth as directed. (Patient not taking: Reported on 04/12/2023)   No facility-administered encounter medications on file as of 04/12/2023.     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   Mitral valve insufficiency 03/31/2023   MSSA bacteremia 03/29/2023   OSA (obstructive sleep apnea) 03/29/2023   Hypokalemia 03/29/2023   Palpitations 12/28/2022   HTN (hypertension) 12/28/2022   Non-cardiac chest pain 12/28/2022   HLD (hyperlipidemia) 12/28/2022     Health Maintenance Due  Topic Date Due   Pneumococcal Vaccine 16-19 Years old (1 of 2 - PCV) Never done   DTaP/Tdap/Td (1 - Tdap) Never done   Cervical Cancer Screening (HPV/Pap Cotest)  Never done   MAMMOGRAM  Never done   Zoster  Vaccines- Shingrix (1 of 2) Never done   INFLUENZA VACCINE  10/27/2022   COVID-19 Vaccine (1 - 2024-25 season) Never done     Review of Systems  Physical Exam   BP (!) 126/56   Pulse (!) 102   Resp 16   Ht 5\' 3"  (1.6 m)   Wt 252 lb (114.3 kg)   LMP 07/26/2020 Comment: denies possibility of pregnancy  BMI 44.64 kg/m    No results found for: "CD4TCELL" No results found for: "CD4TABS" No results found for: "HIV1RNAQUANT" No results found for: "HEPBSAB" No results found for: "RPR", "LABRPR"  CBC Lab Results  Component Value Date   WBC 11.1 (H) 04/05/2023   RBC 3.44 (L) 04/05/2023   HGB 10.0 (L) 04/05/2023   HCT 31.9 (L) 04/05/2023   PLT 353 04/05/2023   MCV 92.7 04/05/2023   MCH 29.1 04/05/2023   MCHC 31.3 04/05/2023   RDW 13.8 04/05/2023   LYMPHSABS 3.5 04/05/2023   MONOABS 0.8 04/05/2023   EOSABS 0.2 04/05/2023    BMET Lab Results  Component Value Date   NA  139 04/05/2023   K 3.6 04/05/2023   CL 99 04/05/2023   CO2 28 04/05/2023   GLUCOSE 120 (H) 04/05/2023   BUN 7 04/05/2023   CREATININE 0.81 04/05/2023   CALCIUM 8.9 04/05/2023   GFRNONAA >60 04/05/2023   LABS Blood culture: Staphylococcus aureus  RADIOLOGY Spine MRI: Epidural abscess  Lab Results  Component Value Date   ESRSEDRATE 87 (H) 04/12/2023   Lab Results  Component Value Date   CRP 14.6 (H) 04/12/2023     Assessment and Plan  Staphylococcus aureus bloodstream infection Recent hospitalization for Staphylococcus aureus bloodstream infection with high fever (up to 102.36F) and malaise starting in late December. Likely source is an epidural injection received in October. Discussed that 20% of the population carries Staphylococcus aureus, which can cause infection under stress or weakened immunity. Currently on intravenous antibiotics via PICC line, with a potential switch to oral antibiotics in mid-February. - Continue intravenous antibiotics until mid-February - Perform lab work today -  Monitor for signs of infection or complications with PICC line  Long term use of medication = will check cr function  Epidural abscess Diagnosed with an epidural abscess during recent hospitalization. Symptoms included back pain, weakness, and difficulty walking. Persistent weakness and difficulty with ambulation, particularly on stairs. Discussed need for repeat MRI now and follow-up with Dr. Lovell Sheehan. - Order repeat MRI of the spine to review now rather than end of treatment - Follow up with Dr. Lovell Sheehan on February 27 or 28 - Monitor for worsening weakness or pain  Vaginal candidiasis Developed vaginal candidiasis approximately three days after starting antibiotics. Symptoms included itchiness. Took Diflucan with symptom improvement. Discussed need for a refill of fluconazole to manage symptoms as needed. - Refill fluconazole prescription  General Health Maintenance Discussed overall health maintenance in the context of current treatment and recovery. Emphasized coordinating with home health for dressing changes and ensuring an adequate supply of medications and support for home care. - Coordinate with home health for dressing changes - Ensure adequate supply of medications and support for home care.

## 2023-04-13 LAB — CBC WITH DIFFERENTIAL/PLATELET
Absolute Lymphocytes: 2775 {cells}/uL (ref 850–3900)
Absolute Monocytes: 653 {cells}/uL (ref 200–950)
Basophils Absolute: 61 {cells}/uL (ref 0–200)
Basophils Relative: 0.7 %
Eosinophils Absolute: 78 {cells}/uL (ref 15–500)
Eosinophils Relative: 0.9 %
HCT: 34.8 % — ABNORMAL LOW (ref 35.0–45.0)
Hemoglobin: 11.3 g/dL — ABNORMAL LOW (ref 11.7–15.5)
MCH: 29.7 pg (ref 27.0–33.0)
MCHC: 32.5 g/dL (ref 32.0–36.0)
MCV: 91.3 fL (ref 80.0–100.0)
MPV: 10.2 fL (ref 7.5–12.5)
Monocytes Relative: 7.5 %
Neutro Abs: 5133 {cells}/uL (ref 1500–7800)
Neutrophils Relative %: 59 %
Platelets: 413 10*3/uL — ABNORMAL HIGH (ref 140–400)
RBC: 3.81 10*6/uL (ref 3.80–5.10)
RDW: 12.7 % (ref 11.0–15.0)
Total Lymphocyte: 31.9 %
WBC: 8.7 10*3/uL (ref 3.8–10.8)

## 2023-04-13 LAB — BASIC METABOLIC PANEL
BUN/Creatinine Ratio: 14 (calc) (ref 6–22)
BUN: 15 mg/dL (ref 7–25)
CO2: 26 mmol/L (ref 20–32)
Calcium: 9.7 mg/dL (ref 8.6–10.4)
Chloride: 100 mmol/L (ref 98–110)
Creat: 1.07 mg/dL — ABNORMAL HIGH (ref 0.50–1.03)
Glucose, Bld: 146 mg/dL — ABNORMAL HIGH (ref 65–99)
Potassium: 5 mmol/L (ref 3.5–5.3)
Sodium: 137 mmol/L (ref 135–146)

## 2023-04-13 LAB — SEDIMENTATION RATE: Sed Rate: 87 mm/h — ABNORMAL HIGH (ref 0–30)

## 2023-04-13 LAB — C-REACTIVE PROTEIN: CRP: 14.6 mg/L — ABNORMAL HIGH (ref <8.0)

## 2023-04-14 ENCOUNTER — Ambulatory Visit (HOSPITAL_COMMUNITY)
Admission: RE | Admit: 2023-04-14 | Discharge: 2023-04-14 | Disposition: A | Discharge status: Home / Self Care | Payer: Commercial Managed Care - PPO | Source: Ambulatory Visit | Attending: Internal Medicine | Admitting: Internal Medicine

## 2023-04-14 DIAGNOSIS — M4643 Discitis, unspecified, cervicothoracic region: Secondary | ICD-10-CM | POA: Diagnosis present

## 2023-04-14 MED ORDER — GADOBUTROL 1 MMOL/ML IV SOLN
10.0000 mL | Freq: Once | INTRAVENOUS | Status: AC | PRN
  Administered 2023-04-14: 10 mL via INTRAVENOUS

## 2023-04-19 ENCOUNTER — Telehealth: Payer: Self-pay

## 2023-04-19 NOTE — Telephone Encounter (Signed)
Patient called and LVM stating she still has not had a HH nurse come to her home for picc line dressing changes.  Patient stated she is unsure of who her [facility] team is.  I have reached out to [facility] with [employer] to assist with coordinating this for the patient.  I also attempted to reach back out to the patient. No answer Brice Kossman Jonathon Resides, CMA

## 2023-04-20 NOTE — Telephone Encounter (Signed)
[employer] confirmed Tiffany, RN was able to make home visit today 1/23.  Sandie Ano, RN

## 2023-04-20 NOTE — Telephone Encounter (Signed)
Message sent to Jeri Modena, RN with [employer] requesting update of home health status.   Sandie Ano, RN

## 2023-05-04 ENCOUNTER — Encounter: Payer: Self-pay | Admitting: Internal Medicine

## 2023-05-04 ENCOUNTER — Ambulatory Visit: Payer: Commercial Managed Care - PPO | Admitting: Internal Medicine

## 2023-05-04 ENCOUNTER — Other Ambulatory Visit: Payer: Self-pay

## 2023-05-04 ENCOUNTER — Other Ambulatory Visit (HOSPITAL_COMMUNITY): Payer: Self-pay | Admitting: Internal Medicine

## 2023-05-04 ENCOUNTER — Ambulatory Visit: Payer: Self-pay | Admitting: Internal Medicine

## 2023-05-04 VITALS — BP 134/77 | HR 96 | Temp 99.1°F | Wt 255.4 lb

## 2023-05-04 DIAGNOSIS — Z79899 Other long term (current) drug therapy: Secondary | ICD-10-CM

## 2023-05-04 DIAGNOSIS — B3731 Acute candidiasis of vulva and vagina: Secondary | ICD-10-CM

## 2023-05-04 DIAGNOSIS — M4643 Discitis, unspecified, cervicothoracic region: Secondary | ICD-10-CM

## 2023-05-04 DIAGNOSIS — G061 Intraspinal abscess and granuloma: Secondary | ICD-10-CM | POA: Diagnosis not present

## 2023-05-04 DIAGNOSIS — B9561 Methicillin susceptible Staphylococcus aureus infection as the cause of diseases classified elsewhere: Secondary | ICD-10-CM | POA: Diagnosis not present

## 2023-05-04 DIAGNOSIS — M545 Low back pain, unspecified: Secondary | ICD-10-CM

## 2023-05-04 MED ORDER — CEFADROXIL 500 MG PO CAPS: 1000.0000 mg | ORAL_CAPSULE | Freq: Two times a day (BID) | ORAL | 3 refills | Status: DC

## 2023-05-04 MED ORDER — FLUCONAZOLE 150 MG PO TABS: 150.0000 mg | ORAL_TABLET | ORAL | 0 refills | Status: DC | PRN

## 2023-05-04 NOTE — Progress Notes (Signed)
 Patient ID: Debbie Goodwin, female   DOB: Feb 16, 1971, 53 y.o.   MRN: [id number]  HPI Debbie Goodwin is a 53yo F with MSSA epidural abscess, she is tolerating her iv infusion with cefazolin   Reviewed recent labwork  Outpatient Encounter Medications as of 05/04/2023  Medication Sig   ALPRAZolam  (XANAX ) 0.25 MG tablet Take 0.25 mg by mouth 3 (three) times daily as needed for anxiety.   amitriptyline  (ELAVIL ) 25 MG tablet Take 25-50 mg by mouth at bedtime.   betamethasone  dipropionate 0.05 % cream Apply 1 Application topically 2 (two) times daily as needed (rash).   Blood Glucose Monitoring Suppl DEVI 1 each by Does not apply route in the morning, at noon, and at bedtime. May substitute to any manufacturer covered by patient's insurance.   buPROPion  (WELLBUTRIN  XL) 300 MG 24 hr tablet Take 300 mg by mouth daily.   cefadroxil  (DURICEF) 500 MG capsule Take 2 capsules (1,000 mg total) by mouth 2 (two) times daily. To start day after IV abtx has stopped   ceFAZolin  (ANCEF ) IVPB Inject 2 g into the vein every 8 (eight) hours. Indication:  MSSA epidural abscess First Dose: Yes Last Day of Therapy:  05/13/23 Labs - Once weekly:  CBC/D and BMP, Labs - Once weekly: ESR and CRP Method of administration: IV Push Method of administration may be changed at the discretion of home infusion pharmacist based upon assessment of the patient and/or caregiver's ability to self-administer the medication ordered.   Cholecalciferol (VITAMIN D-3 PO) Take 1 capsule by mouth daily.   cyclobenzaprine  (FLEXERIL ) 10 MG tablet Take 10 mg by mouth 3 (three) times daily as needed for muscle spasms.   DULoxetine  (CYMBALTA ) 60 MG capsule Take 60 mg by mouth daily.   famotidine  (PEPCID ) 20 MG tablet Take 1 tablet (20 mg total) by mouth at bedtime.   Flaxseed, Linseed, (FLAXSEED OIL PO) Take 1 capsule by mouth daily.   gabapentin  (NEURONTIN ) 300 MG capsule Take 900 mg by mouth daily.   ibuprofen (ADVIL,MOTRIN) 200 MG tablet Take 800  mg by mouth every 6 (six) hours as needed for moderate pain.   ketoconazole  (NIZORAL ) 2 % cream Apply topically 2 (two) times daily.   levothyroxine  (SYNTHROID ) 100 MCG tablet Take 100 mcg by mouth daily.   lidocaine  (LIDODERM ) 5 % Place 1 patch onto the skin daily. Remove & Discard patch within 12 hours or as directed by MD   metFORMIN  (GLUCOPHAGE ) 1000 MG tablet Take 1,000 mg by mouth 2 (two) times daily.   mometasone (ELOCON) 0.1 % ointment Apply 1 Application topically daily.   olmesartan  (BENICAR ) 40 MG tablet Take 40 mg by mouth daily.   Omega-3 Fatty Acids (FISH OIL) 300 MG CAPS Take 1 capsule by mouth daily.   Oxycodone  HCl 10 MG TABS Take 1 tablet (10 mg total) by mouth every 6 (six) hours as needed (pain).   pantoprazole  (PROTONIX ) 40 MG tablet Take 40 mg by mouth daily.   polyvinyl alcohol  (LIQUIFILM TEARS) 1.4 % ophthalmic solution Place 1 drop into both eyes as needed for dry eyes.   rOPINIRole  (REQUIP ) 1 MG tablet Take 1 mg by mouth as needed (restless legs).   rOPINIRole  (REQUIP ) 3 MG tablet Take 3 mg by mouth daily.   simvastatin  (ZOCOR ) 40 MG tablet Take 40 mg by mouth daily.   Zinc Acetate, Oral, (ZINC ACETATE PO) Take 1 tablet by mouth daily.   [DISCONTINUED] fluconazole  (DIFLUCAN ) 150 MG tablet Take 1 tablet (150 mg total) by mouth every  three (3) days as needed (yeast infection treatment/prevention).   fluconazole  (DIFLUCAN ) 150 MG tablet Take 1 tablet (150 mg total) by mouth every three (3) days as needed (yeast infection treatment/prevention).   methylPREDNISolone (MEDROL DOSEPAK) 4 MG TBPK tablet Take 4 mg by mouth as directed. (Patient not taking: Reported on 04/12/2023)   No facility-administered encounter medications on file as of 05/04/2023.     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   Mitral valve insufficiency 03/31/2023   MSSA bacteremia 03/29/2023   OSA (obstructive sleep apnea) 03/29/2023   Hypokalemia 03/29/2023   Palpitations 12/28/2022   HTN (hypertension) 12/28/2022   Non-cardiac chest pain 12/28/2022   HLD (hyperlipidemia) 12/28/2022     Health Maintenance Due  Topic Date Due   COVID-19 Vaccine (1) Never done   Pneumococcal Vaccine 50-72 Years old (1 of 2 - PCV) Never done   DTaP/Tdap/Td (1 - Tdap) Never done   Zoster Vaccines- Shingrix  (1 of 2) Never done   Cervical Cancer Screening (HPV/Pap Cotest)  Never done   MAMMOGRAM  Never done   INFLUENZA VACCINE  10/27/2022     Review of Systems  Physical Exam   BP 134/77   Pulse 96   Temp 99.1 F (37.3 C) (Oral)   Wt 255 lb 6.4 oz (115.8 kg)   LMP 07/26/2020 Comment: denies possibility of pregnancy  SpO2 96%   BMI 45.24 kg/m    No results found for: CD4TCELL No results found for: CD4TABS No results found for: HIV1RNAQUANT No results found for: HEPBSAB No results found for: RPR, LABRPR  CBC Lab Results  Component Value Date   WBC 8.7 04/12/2023   RBC 3.81 04/12/2023   HGB 11.3 (L) 04/12/2023   HCT 34.8 (L) 04/12/2023   PLT 413 (H) 04/12/2023   MCV 91.3 04/12/2023   MCH 29.7 04/12/2023   MCHC 32.5 04/12/2023   RDW 12.7 04/12/2023   LYMPHSABS 3.5 04/05/2023   MONOABS 0.8 04/05/2023   EOSABS 78 04/12/2023    BMET Lab Results  Component Value Date   NA 137 04/12/2023   K 5.0 04/12/2023   CL 100 04/12/2023   CO2 26 04/12/2023   GLUCOSE 146 (H) 04/12/2023   BUN 15 04/12/2023   CREATININE 1.07 (H) 04/12/2023   CALCIUM 9.7 04/12/2023   GFRNONAA >60 04/05/2023       Assessment and Plan  MSSA epidural abscess= plan to take Iv abtx through 2/15, then plan to start cefadroxil  thereafter. Length of oral abtx course dependent on trending inflammatory markers  Long term medication management = will have cr checked today  [place] candidiasis = can use fluconazole  as needed.

## 2023-05-05 ENCOUNTER — Ambulatory Visit (HOSPITAL_COMMUNITY)
Admission: RE | Admit: 2023-05-05 | Discharge: 2023-05-05 | Disposition: A | Discharge status: Home / Self Care | Payer: Commercial Managed Care - PPO | Source: Ambulatory Visit | Attending: Internal Medicine | Admitting: Internal Medicine

## 2023-05-05 DIAGNOSIS — M545 Low back pain, unspecified: Secondary | ICD-10-CM | POA: Diagnosis present

## 2023-05-10 ENCOUNTER — Ambulatory Visit (HOSPITAL_COMMUNITY): Payer: Commercial Managed Care - PPO

## 2023-06-11 ENCOUNTER — Encounter: Payer: Self-pay | Admitting: Internal Medicine

## 2023-06-14 ENCOUNTER — Ambulatory Visit: Payer: Commercial Managed Care - PPO | Admitting: Internal Medicine

## 2023-06-26 ENCOUNTER — Encounter: Payer: Self-pay | Admitting: Internal Medicine

## 2023-06-26 ENCOUNTER — Other Ambulatory Visit: Payer: Self-pay

## 2023-06-26 ENCOUNTER — Ambulatory Visit: Admitting: Internal Medicine

## 2023-06-26 VITALS — BP 108/68 | HR 85 | Temp 97.3°F | Ht 63.0 in | Wt 261.0 lb

## 2023-06-26 DIAGNOSIS — G062 Extradural and subdural abscess, unspecified: Secondary | ICD-10-CM

## 2023-06-26 DIAGNOSIS — M4643 Discitis, unspecified, cervicothoracic region: Secondary | ICD-10-CM

## 2023-06-26 DIAGNOSIS — B9561 Methicillin susceptible Staphylococcus aureus infection as the cause of diseases classified elsewhere: Secondary | ICD-10-CM | POA: Diagnosis not present

## 2023-06-26 NOTE — Progress Notes (Signed)
 Patient ID: Debbie Goodwin, female   DOB: [birth year], 53 y.o.   MRN: [id number]  HPI Debbie Goodwin is a 53yo F whto is being treated for MSSA epidural abscess where she was on, cefazoin Iv abtx through 2/15, then has been on cefadroxil thereafter. She is tolerating abtx without difficulty. Still some low back pain  Outpatient Encounter Medications as of 06/26/2023  Medication Sig   ALPRAZolam (XANAX) 0.25 MG tablet Take 0.25 mg by mouth 3 (three) times daily as needed for anxiety.   amitriptyline (ELAVIL) 25 MG tablet Take 25-50 mg by mouth at bedtime.   betamethasone dipropionate 0.05 % cream Apply 1 Application topically 2 (two) times daily as needed (rash).   Blood Glucose Monitoring Suppl DEVI 1 each by Does not apply route in the morning, at noon, and at bedtime. May substitute to any manufacturer covered by patient's insurance.   buPROPion (WELLBUTRIN XL) 300 MG 24 hr tablet Take 300 mg by mouth daily.   cefadroxil (DURICEF) 500 MG capsule Take 2 capsules (1,000 mg total) by mouth 2 (two) times daily. To start day after IV abtx has stopped   Cholecalciferol (VITAMIN D-3 PO) Take 1 capsule by mouth daily.   cyclobenzaprine (FLEXERIL) 10 MG tablet Take 10 mg by mouth 3 (three) times daily as needed for muscle spasms.   DULoxetine (CYMBALTA) 60 MG capsule Take 60 mg by mouth daily.   famotidine (PEPCID) 20 MG tablet Take 1 tablet (20 mg total) by mouth at bedtime.   Flaxseed, Linseed, (FLAXSEED OIL PO) Take 1 capsule by mouth daily.   fluconazole (DIFLUCAN) 150 MG tablet Take 1 tablet (150 mg total) by mouth every three (3) days as needed (yeast infection treatment/prevention).   furosemide (LASIX) 40 MG tablet Take 40 mg by mouth 2 (two) times daily.   gabapentin (NEURONTIN) 300 MG capsule Take 900 mg by mouth daily.   ibuprofen (ADVIL,MOTRIN) 200 MG tablet Take 800 mg by mouth every 6 (six) hours as needed for moderate pain.   ketoconazole (NIZORAL) 2 % cream Apply topically 2 (two) times  daily.   levothyroxine (SYNTHROID) 100 MCG tablet Take 100 mcg by mouth daily.   lidocaine (LIDODERM) 5 % Place 1 patch onto the skin daily. Remove & Discard patch within 12 hours or as directed by MD   metFORMIN (GLUCOPHAGE) 1000 MG tablet Take 1,000 mg by mouth 2 (two) times daily.   methylPREDNISolone (MEDROL DOSEPAK) 4 MG TBPK tablet Take 4 mg by mouth as directed.   mometasone (ELOCON) 0.1 % ointment Apply 1 Application topically daily.   olmesartan (BENICAR) 40 MG tablet Take 40 mg by mouth daily.   Omega-3 Fatty Acids (FISH OIL) 300 MG CAPS Take 1 capsule by mouth daily.   Oxycodone HCl 10 MG TABS Take 1 tablet (10 mg total) by mouth every 6 (six) hours as needed (pain).   pantoprazole (PROTONIX) 40 MG tablet Take 40 mg by mouth daily.   polyvinyl alcohol (LIQUIFILM TEARS) 1.4 % ophthalmic solution Place 1 drop into both eyes as needed for dry eyes.   rOPINIRole (REQUIP) 1 MG tablet Take 1 mg by mouth as needed (restless legs).   rOPINIRole (REQUIP) 3 MG tablet Take 3 mg by mouth daily.   simvastatin (ZOCOR) 40 MG tablet Take 40 mg by mouth daily.   Zinc Acetate, Oral, (ZINC ACETATE PO) Take 1 tablet by mouth daily.   No facility-administered encounter medications on file as of 06/26/2023.     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   Mitral valve insufficiency 03/31/2023   MSSA bacteremia 03/29/2023   OSA (obstructive sleep apnea) 03/29/2023   Hypokalemia 03/29/2023   Palpitations 12/28/2022   HTN  (hypertension) 12/28/2022   Non-cardiac chest pain 12/28/2022   HLD (hyperlipidemia) 12/28/2022     Health Maintenance Due  Topic Date Due   COVID-19 Vaccine (1) Never done   Pneumococcal Vaccine 44-31 Years old (1 of 2 - PCV) Never done   DTaP/Tdap/Td (1 - Tdap) Never done   Zoster Vaccines- Shingrix (1 of 2) Never done   Cervical Cancer Screening (HPV/Pap Cotest)  Never done   MAMMOGRAM  Never done   INFLUENZA VACCINE  10/27/2022     Review of Systems  Physical Exam   BP 108/68   Pulse 85   Temp (!) 97.3 F (36.3 C) (Temporal)   Ht 5\' 3"  (1.6 m)   Wt 261 lb (118.4 kg)   LMP 07/26/2020 Comment: denies possibility of pregnancy  SpO2 96%   BMI 46.23 kg/m    Physical Exam  Constitutional:  oriented to person, place, and time. appears well-developed and well-nourished. No distress.  HENT: [place]/AT, PERRLA, no scleral icterus Mouth/Throat: Oropharynx is clear and moist. No oropharyngeal exudate.  Cardiovascular: Normal rate, regular rhythm and normal heart sounds. Exam reveals no gallop and no friction rub.  No murmur heard.  Pulmonary/Chest: Effort normal and breath sounds normal. No respiratory distress.  has no wheezes.  Lymphadenopathy: no cervical adenopathy. No axillary adenopathy Neurological: alert and oriented to person, place, and time.  Skin: Skin is warm and dry. No rash noted. No erythema.  Psychiatric: a normal mood and affect.  behavior is normal.     CBC Lab Results  Component Value Date   WBC 8.7 04/12/2023   RBC 3.81 04/12/2023   HGB 11.3 (L) 04/12/2023   HCT 34.8 (L) 04/12/2023   PLT 413 (H) 04/12/2023   MCV 91.3 04/12/2023   MCH 29.7 04/12/2023   MCHC 32.5 04/12/2023   RDW 12.7 04/12/2023   LYMPHSABS 3.5 04/05/2023   MONOABS 0.8 04/05/2023   EOSABS 78 04/12/2023    BMET Lab Results  Component Value Date   NA 137 04/12/2023   K 5.0 04/12/2023   CL 100 04/12/2023   CO2 26 04/12/2023   GLUCOSE 146 (H) 04/12/2023   BUN 15 04/12/2023    CREATININE 1.07 (H) 04/12/2023   CALCIUM 9.7 04/12/2023   GFRNONAA >60 04/05/2023    Lab Results  Component Value Date   ESRSEDRATE 87 (H) 04/12/2023   Lab Results  Component Value Date   ESRSEDRATE 29 06/26/2023   Lab Results  Component Value Date   CRP 3.1 06/26/2023     Assessment and Plan MSSA epidural abscess/discitis  = continue on cefadroxil. And we will check.  Sed rate and crp. Continue for additional month then monitor off of abtx  Long term monitoring of abtx = will check sed rate and crp

## 2023-06-27 LAB — CBC WITH DIFFERENTIAL/PLATELET
Absolute Lymphocytes: 3074 {cells}/uL (ref 850–3900)
Absolute Monocytes: 458 {cells}/uL (ref 200–950)
Basophils Absolute: 12 {cells}/uL (ref 0–200)
Basophils Relative: 0.2 %
Eosinophils Absolute: 189 {cells}/uL (ref 15–500)
Eosinophils Relative: 3.1 %
HCT: 32 % — ABNORMAL LOW (ref 35.0–45.0)
Hemoglobin: 10.4 g/dL — ABNORMAL LOW (ref 11.7–15.5)
MCH: 28.5 pg (ref 27.0–33.0)
MCHC: 32.5 g/dL (ref 32.0–36.0)
MCV: 87.7 fL (ref 80.0–100.0)
MPV: 11.2 fL (ref 7.5–12.5)
Monocytes Relative: 7.5 %
Neutro Abs: 2367 {cells}/uL (ref 1500–7800)
Neutrophils Relative %: 38.8 %
Platelets: 205 10*3/uL (ref 140–400)
RBC: 3.65 10*6/uL — ABNORMAL LOW (ref 3.80–5.10)
RDW: 13.3 % (ref 11.0–15.0)
Total Lymphocyte: 50.4 %
WBC: 6.1 10*3/uL (ref 3.8–10.8)

## 2023-06-27 LAB — SEDIMENTATION RATE: Sed Rate: 29 mm/h (ref 0–30)

## 2023-06-27 LAB — BASIC METABOLIC PANEL WITH GFR
BUN/Creatinine Ratio: 27 (calc) — ABNORMAL HIGH (ref 6–22)
BUN: 27 mg/dL — ABNORMAL HIGH (ref 7–25)
CO2: 28 mmol/L (ref 20–32)
Calcium: 9.1 mg/dL (ref 8.6–10.4)
Chloride: 103 mmol/L (ref 98–110)
Creat: 0.99 mg/dL (ref 0.50–1.03)
Glucose, Bld: 104 mg/dL — ABNORMAL HIGH (ref 65–99)
Potassium: 4.9 mmol/L (ref 3.5–5.3)
Sodium: 141 mmol/L (ref 135–146)
eGFR: 68 mL/min/{1.73_m2} (ref >=60)

## 2023-06-27 LAB — C-REACTIVE PROTEIN: CRP: 3.1 mg/L (ref <8.0)

## 2023-07-05 ENCOUNTER — Ambulatory Visit (HOSPITAL_COMMUNITY)
Admission: RE | Admit: 2023-07-05 | Discharge: 2023-07-05 | Disposition: A | Discharge status: Home / Self Care | Source: Ambulatory Visit | Attending: Internal Medicine | Admitting: Internal Medicine

## 2023-07-05 DIAGNOSIS — M4643 Discitis, unspecified, cervicothoracic region: Secondary | ICD-10-CM | POA: Insufficient documentation

## 2023-07-05 MED ORDER — GADOBUTROL 1 MMOL/ML IV SOLN
10.0000 mL | Freq: Once | INTRAVENOUS | Status: AC | PRN
  Administered 2023-07-05: 10 mL via INTRAVENOUS

## 2023-07-07 ENCOUNTER — Other Ambulatory Visit (HOSPITAL_COMMUNITY): Payer: Self-pay | Admitting: Internal Medicine

## 2023-07-07 ENCOUNTER — Ambulatory Visit (HOSPITAL_COMMUNITY)
Admission: RE | Admit: 2023-07-07 | Discharge: 2023-07-07 | Disposition: A | Discharge status: Home / Self Care | Source: Ambulatory Visit | Attending: Internal Medicine | Admitting: Internal Medicine

## 2023-07-07 ENCOUNTER — Encounter (HOSPITAL_COMMUNITY): Payer: Self-pay | Admitting: Internal Medicine

## 2023-07-07 DIAGNOSIS — M25511 Pain in right shoulder: Secondary | ICD-10-CM

## 2023-07-07 DIAGNOSIS — R52 Pain, unspecified: Secondary | ICD-10-CM

## 2023-08-02 ENCOUNTER — Encounter: Payer: Self-pay | Admitting: Internal Medicine

## 2023-08-02 ENCOUNTER — Other Ambulatory Visit: Payer: Self-pay

## 2023-08-02 ENCOUNTER — Ambulatory Visit: Admitting: Internal Medicine

## 2023-08-02 VITALS — BP 110/71 | HR 99 | Temp 98.2°F | Ht 63.0 in | Wt 250.0 lb

## 2023-08-02 DIAGNOSIS — G062 Extradural and subdural abscess, unspecified: Secondary | ICD-10-CM

## 2023-08-02 DIAGNOSIS — B9561 Methicillin susceptible Staphylococcus aureus infection as the cause of diseases classified elsewhere: Secondary | ICD-10-CM

## 2023-08-02 DIAGNOSIS — Z79899 Other long term (current) drug therapy: Secondary | ICD-10-CM

## 2023-08-02 DIAGNOSIS — B3731 Acute candidiasis of vulva and vagina: Secondary | ICD-10-CM | POA: Diagnosis not present

## 2023-08-02 DIAGNOSIS — M4643 Discitis, unspecified, cervicothoracic region: Secondary | ICD-10-CM

## 2023-08-02 NOTE — Progress Notes (Signed)
 RFV: follow up of epidural abscess  Patient ID: Debbie Goodwin, female   DOB: May 12, 1970, 53 y.o.   MRN: [id number]  HPI [name] is a 53 yo F with complicated infection of MSSA epidural abscess= Has roughly 2 weeks left of cefardoxil for MSSA epidural abscess - roughly 4 months of treatment   Getting stronger strength in her legs about 80-85% from prior to being sick Also has SacroIlicac joint - pain bilaterally Right shoulder -- may have rotator cuff tear  She had repeat mri in beginning of April : IMPRESSION: Continued abnormal appearance of the right T11-12 facet joint, with edema and enhancement. There has been resolution of the previously seen epidural inflammatory changes. Surrounding soft tissue edematous changes are diminished. Findings are consistent with favorable evolutionary change. These findings could be related to chronic healing of the previous infection and do not establish that there is active residual infection, though that is not excluded. No worsening or new finding.    Outpatient Encounter Medications as of 08/02/2023  Medication Sig   ALPRAZolam  (XANAX ) 0.25 MG tablet Take 0.25 mg by mouth 3 (three) times daily as needed for anxiety.   amitriptyline  (ELAVIL ) 25 MG tablet Take 25-50 mg by mouth at bedtime.   betamethasone dipropionate 0.05 % cream Apply 1 Application topically 2 (two) times daily as needed (rash).   Blood Glucose Monitoring Suppl DEVI 1 each by Does not apply route in the morning, at noon, and at bedtime. May substitute to any manufacturer covered by patient's insurance.   buPROPion  (WELLBUTRIN  XL) 300 MG 24 hr tablet Take 300 mg by mouth daily.   cefadroxil  (DURICEF) 500 MG capsule Take 2 capsules (1,000 mg total) by mouth 2 (two) times daily. To start day after IV abtx has stopped   Cholecalciferol (VITAMIN D-3 PO) Take 1 capsule by mouth daily.   cyclobenzaprine  (FLEXERIL ) 10 MG tablet Take 10 mg by mouth 3 (three) times daily as needed for  muscle spasms.   DULoxetine  (CYMBALTA ) 60 MG capsule Take 60 mg by mouth daily.   famotidine  (PEPCID ) 20 MG tablet Take 1 tablet (20 mg total) by mouth at bedtime.   Flaxseed, Linseed, (FLAXSEED OIL PO) Take 1 capsule by mouth daily.   fluconazole  (DIFLUCAN ) 150 MG tablet Take 1 tablet (150 mg total) by mouth every three (3) days as needed (yeast infection treatment/prevention).   furosemide (LASIX) 40 MG tablet Take 40 mg by mouth 2 (two) times daily.   gabapentin  (NEURONTIN ) 300 MG capsule Take 900 mg by mouth daily.   ibuprofen (ADVIL,MOTRIN) 200 MG tablet Take 800 mg by mouth every 6 (six) hours as needed for moderate pain.   ketoconazole (NIZORAL) 2 % cream Apply topically 2 (two) times daily.   levothyroxine  (SYNTHROID ) 100 MCG tablet Take 100 mcg by mouth daily.   lidocaine  (LIDODERM ) 5 % Place 1 patch onto the skin daily. Remove & Discard patch within 12 hours or as directed by MD   metFORMIN  (GLUCOPHAGE ) 1000 MG tablet Take 1,000 mg by mouth 2 (two) times daily.   methylPREDNISolone (MEDROL DOSEPAK) 4 MG TBPK tablet Take 4 mg by mouth as directed.   mometasone (ELOCON) 0.1 % ointment Apply 1 Application topically daily.   olmesartan (BENICAR) 40 MG tablet Take 40 mg by mouth daily.   Omega-3 Fatty Acids (FISH OIL) 300 MG CAPS Take 1 capsule by mouth daily.   Oxycodone  HCl 10 MG TABS Take 1 tablet (10 mg total) by mouth every 6 (six) hours as needed (pain).  pantoprazole  (PROTONIX ) 40 MG tablet Take 40 mg by mouth daily.   polyvinyl alcohol  (LIQUIFILM TEARS) 1.4 % ophthalmic solution Place 1 drop into both eyes as needed for dry eyes.   rOPINIRole  (REQUIP ) 1 MG tablet Take 1 mg by mouth as needed (restless legs).   rOPINIRole  (REQUIP ) 3 MG tablet Take 3 mg by mouth daily.   simvastatin  (ZOCOR ) 40 MG tablet Take 40 mg by mouth daily.   Zinc Acetate, Oral, (ZINC ACETATE PO) Take 1 tablet by mouth daily.   No facility-administered encounter medications on file as of 08/02/2023.      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   Mitral valve insufficiency 03/31/2023   MSSA bacteremia 03/29/2023   OSA (obstructive sleep apnea) 03/29/2023   Hypokalemia 03/29/2023   Palpitations 12/28/2022   HTN (hypertension) 12/28/2022   Non-cardiac chest pain 12/28/2022   HLD (hyperlipidemia) 12/28/2022     Health Maintenance Due  Topic Date Due   COVID-19 Vaccine (1) Never done   DTaP/Tdap/Td (1 - Tdap) Never done   Pneumococcal Vaccine 68-37 Years old (1 of 2 - PCV) Never done   Zoster Vaccines- Shingrix (1 of 2) Never done   Cervical Cancer Screening (HPV/Pap Cotest)  Never done   MAMMOGRAM  Never done     Review of Systems 12 point ros is otherwise negative, except having vaginal yeast infection Physical Exam   BP 110/71   Pulse 99   Temp 98.2 F (36.8 C) (Oral)   Ht 5\' 3"  (1.6 m)   Wt 250 lb (113.4 kg)   LMP 07/26/2020 Comment: denies possibility of pregnancy  SpO2 99%   BMI 44.29 kg/m    Physical Exam  Constitutional:  oriented to person, place, and time. appears well-developed and well-nourished. No distress.  HENT: [place]/AT, PERRLA, no scleral icterus Mouth/Throat: Oropharynx is clear and moist. No oropharyngeal exudate.  Cardiovascular: Normal rate, regular rhythm and normal heart sounds. Exam reveals no gallop and no friction rub.  No murmur heard.  Pulmonary/Chest: Effort normal and breath sounds normal. No respiratory distress.  has no wheezes.  Neck = supple, no nuchal  rigidity Lymphadenopathy: no cervical adenopathy. No axillary adenopathy Neurological: alert and oriented to person, place, and time.  Skin: Skin is warm and dry. No rash noted. No erythema.  Psychiatric: a normal mood and affect.  behavior is normal.   CBC Lab Results  Component Value Date   WBC 6.1 06/26/2023   RBC 3.65 (L) 06/26/2023   HGB 10.4 (L) 06/26/2023   HCT 32.0 (L) 06/26/2023   PLT 205 06/26/2023   MCV 87.7 06/26/2023   MCH 28.5 06/26/2023   MCHC 32.5 06/26/2023   RDW 13.3 06/26/2023   LYMPHSABS 3.5 04/05/2023   MONOABS 0.8 04/05/2023   EOSABS 189 06/26/2023    BMET Lab Results  Component Value Date   NA 141 06/26/2023   K 4.9 06/26/2023   CL 103 06/26/2023   CO2 28 06/26/2023   GLUCOSE 104 (H) 06/26/2023   BUN 27 (H) 06/26/2023   CREATININE 0.99  06/26/2023   CALCIUM 9.1 06/26/2023   GFRNONAA >60 04/05/2023    Lab Results  Component Value Date   ESRSEDRATE 28 08/02/2023   Lab Results  Component Value Date   CRP <3.0 08/02/2023     Assessment and Plan MSSA epidural abscess = cefadroxil  for 2 more weeks, and anticipate to monitor off of abtx. For now  Long term medication management =Will check sed rate and crp   Vaginal candidiasis= will give rx for fluconazole   Let have her back in 6 wk

## 2023-08-03 LAB — C-REACTIVE PROTEIN: CRP: <3 mg/L (ref <8.0)

## 2023-08-03 LAB — SEDIMENTATION RATE: Sed Rate: 28 mm/h (ref 0–30)

## 2023-09-15 ENCOUNTER — Other Ambulatory Visit: Payer: Self-pay

## 2023-09-15 ENCOUNTER — Encounter: Payer: Self-pay | Admitting: Internal Medicine

## 2023-09-15 ENCOUNTER — Ambulatory Visit: Admitting: Internal Medicine

## 2023-09-15 VITALS — BP 93/60 | HR 80 | Temp 97.7°F | Ht 63.0 in | Wt 257.0 lb

## 2023-09-15 DIAGNOSIS — M545 Low back pain, unspecified: Secondary | ICD-10-CM

## 2023-09-15 DIAGNOSIS — M4643 Discitis, unspecified, cervicothoracic region: Secondary | ICD-10-CM

## 2023-09-15 DIAGNOSIS — Z79899 Other long term (current) drug therapy: Secondary | ICD-10-CM

## 2023-09-15 DIAGNOSIS — Z8619 Personal history of other infectious and parasitic diseases: Secondary | ICD-10-CM | POA: Diagnosis not present

## 2023-09-15 DIAGNOSIS — B9561 Methicillin susceptible Staphylococcus aureus infection as the cause of diseases classified elsewhere: Secondary | ICD-10-CM

## 2023-09-15 LAB — GLUCOSE, POCT (MANUAL RESULT ENTRY): POC Glucose: 113 mg/dL — AB (ref 70–99)

## 2023-09-15 NOTE — Progress Notes (Signed)
 Patient hypotensive and complaining of dizziness and drowsiness. CBG obtained and normal at 113.   She reports she took two tablets of oxycodone  this morning for a total of 10 mg, she normally only takes one tablet. Her friend is present and reports that Kimbra often becomes drowsy if she takes two tablets. Dr. Levern Reader notified.   Attempted PIV insertion in left hand, unsuccessful. Patient provided with Pedialyte x 2 to assist with rehydration per Dr. Levern Reader.   Patient remained alert & oriented throughout appointment.   Terie Lear, BSN, RN

## 2023-09-15 NOTE — Progress Notes (Signed)
 Patient ID: Debbie Goodwin, female   DOB: 06/08/1970, 53 y.o.   MRN: [id number]  HPI 53yo F with MSSA epidural abscess who has been on abtx through mid may. She is now Off of abtx. Still has mild Back pain presents. No fevers or chills.  Outpatient Encounter Medications as of 09/15/2023  Medication Sig   ALPRAZolam  (XANAX ) 0.25 MG tablet Take 0.25 mg by mouth 3 (three) times daily as needed for anxiety.   amitriptyline  (ELAVIL ) 25 MG tablet Take 25-50 mg by mouth at bedtime.   betamethasone dipropionate 0.05 % cream Apply 1 Application topically 2 (two) times daily as needed (rash).   Blood Glucose Monitoring Suppl DEVI 1 each by Does not apply route in the morning, at noon, and at bedtime. May substitute to any manufacturer covered by patient's insurance.   buPROPion  (WELLBUTRIN  XL) 300 MG 24 hr tablet Take 300 mg by mouth daily.   cefadroxil  (DURICEF) 500 MG capsule Take 2 capsules (1,000 mg total) by mouth 2 (two) times daily. To start day after IV abtx has stopped   Cholecalciferol (VITAMIN D-3 PO) Take 1 capsule by mouth daily.   cyclobenzaprine  (FLEXERIL ) 10 MG tablet Take 10 mg by mouth 3 (three) times daily as needed for muscle spasms.   DULoxetine  (CYMBALTA ) 60 MG capsule Take 60 mg by mouth daily.   famotidine  (PEPCID ) 20 MG tablet Take 1 tablet (20 mg total) by mouth at bedtime.   Flaxseed, Linseed, (FLAXSEED OIL PO) Take 1 capsule by mouth daily.   fluconazole  (DIFLUCAN ) 150 MG tablet Take 1 tablet (150 mg total) by mouth every three (3) days as needed (yeast infection treatment/prevention).   furosemide (LASIX) 40 MG tablet Take 40 mg by mouth 2 (two) times daily.   gabapentin  (NEURONTIN ) 300 MG capsule Take 900 mg by mouth daily.   ibuprofen (ADVIL,MOTRIN) 200 MG tablet Take 800 mg by mouth every 6 (six) hours as needed for moderate pain.   ketoconazole (NIZORAL) 2 % cream Apply topically 2 (two) times daily.   levothyroxine  (SYNTHROID ) 100 MCG tablet Take 100 mcg by mouth  daily.   lidocaine  (LIDODERM ) 5 % Place 1 patch onto the skin daily. Remove & Discard patch within 12 hours or as directed by MD   metFORMIN  (GLUCOPHAGE ) 1000 MG tablet Take 1,000 mg by mouth 2 (two) times daily.   methylPREDNISolone (MEDROL DOSEPAK) 4 MG TBPK tablet Take 4 mg by mouth as directed.   mometasone (ELOCON) 0.1 % ointment Apply 1 Application topically daily.   olmesartan (BENICAR) 40 MG tablet Take 40 mg by mouth daily.   Omega-3 Fatty Acids (FISH OIL) 300 MG CAPS Take 1 capsule by mouth daily.   Oxycodone  HCl 10 MG TABS Take 1 tablet (10 mg total) by mouth every 6 (six) hours as needed (pain).   pantoprazole  (PROTONIX ) 40 MG tablet Take 40 mg by mouth daily.   polyvinyl alcohol  (LIQUIFILM TEARS) 1.4 % ophthalmic solution Place 1 drop into both eyes as needed for dry eyes.   rOPINIRole  (REQUIP ) 1 MG tablet Take 1 mg by mouth as needed (restless legs).   rOPINIRole  (REQUIP ) 3 MG tablet Take 3 mg by mouth daily.   simvastatin  (ZOCOR ) 40 MG tablet Take 40 mg by mouth daily.   Zinc Acetate, Oral, (ZINC ACETATE PO) Take 1 tablet by mouth daily.   No facility-administered encounter medications on file as of 09/15/2023.     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   Mitral valve insufficiency 03/31/2023   MSSA bacteremia 03/29/2023   OSA (obstructive sleep apnea) 03/29/2023   Hypokalemia 03/29/2023   Palpitations 12/28/2022   HTN (hypertension) 12/28/2022   Non-cardiac chest pain 12/28/2022   HLD  (hyperlipidemia) 12/28/2022     Health Maintenance Due  Topic Date Due   COVID-19 Vaccine (1) Never done   DTaP/Tdap/Td (1 - Tdap) Never done   Pneumococcal Vaccine 45-41 Years old (1 of 2 - PCV) Never done   Zoster Vaccines- Shingrix (1 of 2) Never done   Cervical Cancer Screening (HPV/Pap Cotest)  Never done   MAMMOGRAM  Never done     Review of Systems 12 point ros is negative Physical Exam   BP (!) 88/60   Pulse 80   Temp 97.7 F (36.5 C) (Temporal)   Ht 5' 3 (1.6 m)   Wt 257 lb (116.6 kg)   LMP 07/26/2020 Comment: denies possibility of pregnancy  SpO2 95%   BMI 45.53 kg/m   Physical Exam  Constitutional:  oriented to person, place, and time. appears well-developed and well-nourished. No distress.  HENT: [place]/AT, PERRLA, no scleral icterus Mouth/Throat: Oropharynx is clear and moist. No oropharyngeal exudate.  Cardiovascular: Normal rate, regular rhythm and normal heart sounds. Exam reveals no gallop and no friction rub.  No murmur heard.  Pulmonary/Chest: Effort normal and breath sounds normal. No respiratory distress.  has no wheezes.  Neck = supple, no nuchal rigidity Abdominal: Soft. Bowel sounds are normal.  exhibits no distension. There is no tenderness.  Lymphadenopathy: no cervical adenopathy. No axillary adenopathy Neurological: alert and oriented to person, place, and time.  Skin: Skin is warm and dry. No rash noted. No erythema.  Psychiatric: a normal mood and affect.  behavior is normal.   CBC Lab Results  Component Value Date   WBC 6.1 06/26/2023   RBC 3.65 (L) 06/26/2023   HGB 10.4 (L) 06/26/2023   HCT 32.0 (L) 06/26/2023   PLT 205 06/26/2023   MCV 87.7 06/26/2023   MCH 28.5 06/26/2023   MCHC 32.5 06/26/2023   RDW 13.3 06/26/2023   LYMPHSABS 3.5 04/05/2023   MONOABS 0.8 04/05/2023   EOSABS 189 06/26/2023    BMET Lab Results  Component Value Date   NA 141 06/26/2023   K 4.9 06/26/2023   CL 103 06/26/2023   CO2 28 06/26/2023   GLUCOSE  104 (H) 06/26/2023   BUN 27 (H) 06/26/2023   CREATININE 0.99 06/26/2023   CALCIUM 9.1 06/26/2023   GFRNONAA >60 04/05/2023   Lab Results  Component Value Date   ESRSEDRATE 14 09/15/2023    Assessment and Plan MSSA epidural abscess = has completed course of abtx, had been on tx for 5 months. She reports improved but still some limitations. Physical medicine and rehab referral - for back pain  Long term medication management = will check inflammatory markers  Addendum -Labs today showed WNL.

## 2023-09-16 LAB — CBC WITH DIFFERENTIAL/PLATELET
Absolute Lymphocytes: 4813 {cells}/uL — ABNORMAL HIGH (ref 850–3900)
Absolute Monocytes: 620 {cells}/uL (ref 200–950)
Basophils Absolute: 56 {cells}/uL (ref 0–200)
Basophils Relative: 0.6 %
Eosinophils Absolute: 188 {cells}/uL (ref 15–500)
Eosinophils Relative: 2 %
HCT: 36.1 % (ref 35.0–45.0)
Hemoglobin: 11.4 g/dL — ABNORMAL LOW (ref 11.7–15.5)
MCH: 29.3 pg (ref 27.0–33.0)
MCHC: 31.6 g/dL — ABNORMAL LOW (ref 32.0–36.0)
MCV: 92.8 fL (ref 80.0–100.0)
MPV: 11.2 fL (ref 7.5–12.5)
Monocytes Relative: 6.6 %
Neutro Abs: 3722 {cells}/uL (ref 1500–7800)
Neutrophils Relative %: 39.6 %
Platelets: 254 10*3/uL (ref 140–400)
RBC: 3.89 10*6/uL (ref 3.80–5.10)
RDW: 13.1 % (ref 11.0–15.0)
Total Lymphocyte: 51.2 %
WBC: 9.4 10*3/uL (ref 3.8–10.8)

## 2023-09-16 LAB — BASIC METABOLIC PANEL WITH GFR
BUN/Creatinine Ratio: 19 (calc) (ref 6–22)
BUN: 23 mg/dL (ref 7–25)
CO2: 26 mmol/L (ref 20–32)
Calcium: 9.5 mg/dL (ref 8.6–10.4)
Chloride: 101 mmol/L (ref 98–110)
Creat: 1.24 mg/dL — ABNORMAL HIGH (ref 0.50–1.03)
Glucose, Bld: 106 mg/dL — ABNORMAL HIGH (ref 65–99)
Potassium: 4.6 mmol/L (ref 3.5–5.3)
Sodium: 137 mmol/L (ref 135–146)
eGFR: 52 mL/min/{1.73_m2} — ABNORMAL LOW (ref >=60)

## 2023-09-16 LAB — C-REACTIVE PROTEIN: CRP: <3 mg/L (ref <8.0)

## 2023-09-16 LAB — SEDIMENTATION RATE: Sed Rate: 14 mm/h (ref 0–30)

## 2023-09-19 ENCOUNTER — Telehealth: Payer: Self-pay

## 2023-09-19 NOTE — Telephone Encounter (Signed)
 Received voicemail from patient with concerns about elevation of lymphocytes. Reports she just picked up her last refill of cefadroxil . She isn't sure if she should get more antibiotics or have an MRI done based on lab results.   Secure chat sent to Dr. Luiz.   Debbie Goodwin, BSN, RN

## 2023-09-19 NOTE — Telephone Encounter (Signed)
 Per Dr. Luiz: Her labs reflect that with a mild bump in her creatinine. Her inflammatory markers look good. She doesn't need abtx or mri at this time. We will check in on her again at next appt.   Tamrah Victorino, BSN, RN

## 2023-11-20 ENCOUNTER — Ambulatory Visit: Admitting: Internal Medicine

## 2023-12-11 ENCOUNTER — Ambulatory Visit: Admitting: Internal Medicine

## 2023-12-11 ENCOUNTER — Other Ambulatory Visit: Payer: Self-pay

## 2023-12-11 ENCOUNTER — Encounter: Payer: Self-pay | Admitting: Internal Medicine

## 2023-12-11 VITALS — BP 106/70 | HR 92 | Temp 98.0°F | Wt 262.8 lb

## 2023-12-11 DIAGNOSIS — M4643 Discitis, unspecified, cervicothoracic region: Secondary | ICD-10-CM | POA: Diagnosis not present

## 2023-12-11 DIAGNOSIS — Z79899 Other long term (current) drug therapy: Secondary | ICD-10-CM

## 2023-12-11 DIAGNOSIS — Z23 Encounter for immunization: Secondary | ICD-10-CM

## 2023-12-11 NOTE — Progress Notes (Unsigned)
 Patient ID: Debbie Goodwin, female   DOB: May 12, 1970, 53 y.o.   MRN: [id number]  HPI Hx of MSSA epidural infection/discitis where she was on treatment for 5 months, now off of abtx since summer. She reports doing  About the same for si joint pain. She Saw dr mavis last week who also recommended to wait for any steroid injection. She reports that she had a ground level fall after reaching for something on the porch, fell onto bushes. Otherwise, no new injuries. She has noticed new pains to her mcp 3rd nd 4th joints of left hand, wonders if she has RA.  Outpatient Encounter Medications as of 12/11/2023  Medication Sig   ALPRAZolam  (XANAX ) 0.25 MG tablet Take 0.25 mg by mouth 3 (three) times daily as needed for anxiety.   amitriptyline  (ELAVIL ) 25 MG tablet Take 25-50 mg by mouth at bedtime.   betamethasone dipropionate 0.05 % cream Apply 1 Application topically 2 (two) times daily as needed (rash).   Blood Glucose Monitoring Suppl DEVI 1 each by Does not apply route in the morning, at noon, and at bedtime. May substitute to any manufacturer covered by patient's insurance.   buPROPion  (WELLBUTRIN  XL) 300 MG 24 hr tablet Take 300 mg by mouth daily.   cefadroxil  (DURICEF) 500 MG capsule Take 2 capsules (1,000 mg total) by mouth 2 (two) times daily. To start day after IV abtx has stopped   Cholecalciferol (VITAMIN D-3 PO) Take 1 capsule by mouth daily.   cyclobenzaprine  (FLEXERIL ) 10 MG tablet Take 10 mg by mouth 3 (three) times daily as needed for muscle spasms.   DULoxetine  (CYMBALTA ) 60 MG capsule Take 60 mg by mouth daily.   empagliflozin (JARDIANCE) 10 MG TABS tablet Take by mouth daily.   famotidine  (PEPCID ) 20 MG tablet Take 1 tablet (20 mg total) by mouth at bedtime.   Flaxseed, Linseed, (FLAXSEED OIL PO) Take 1 capsule by mouth daily.   fluconazole  (DIFLUCAN ) 150 MG tablet Take 1 tablet (150 mg total) by mouth every three (3) days as needed (yeast infection treatment/prevention).    furosemide (LASIX) 40 MG tablet Take 40 mg by mouth 2 (two) times daily.   gabapentin  (NEURONTIN ) 300 MG capsule Take 900 mg by mouth daily.   ibuprofen (ADVIL,MOTRIN) 200 MG tablet Take 800 mg by mouth every 6 (six) hours as needed for moderate pain.   ketoconazole (NIZORAL) 2 % cream Apply topically 2 (two) times daily.   levothyroxine  (SYNTHROID ) 100 MCG tablet Take 100 mcg by mouth daily.   lidocaine  (LIDODERM ) 5 % Place 1 patch onto the skin daily. Remove & Discard patch within 12 hours or as directed by MD   metFORMIN  (GLUCOPHAGE ) 1000 MG tablet Take 1,000 mg by mouth 2 (two) times daily.   methylPREDNISolone (MEDROL DOSEPAK) 4 MG TBPK tablet Take 4 mg by mouth as directed.   mometasone (ELOCON) 0.1 % ointment Apply 1 Application topically daily.   olmesartan (BENICAR) 40 MG tablet Take 40 mg by mouth daily.   Omega-3 Fatty Acids (FISH OIL) 300 MG CAPS Take 1 capsule by mouth daily.   Oxycodone  HCl 10 MG TABS Take 1 tablet (10 mg total) by mouth every 6 (six) hours as needed (pain).   pantoprazole  (PROTONIX ) 40 MG tablet Take 40 mg by mouth daily.   polyvinyl alcohol  (LIQUIFILM TEARS) 1.4 % ophthalmic solution Place 1 drop into both eyes as needed for dry eyes.   rOPINIRole  (REQUIP ) 1 MG tablet Take 1 mg by mouth as needed (  restless legs).   rOPINIRole  (REQUIP ) 3 MG tablet Take 3 mg by mouth daily.   simvastatin  (ZOCOR ) 40 MG tablet Take 40 mg by mouth daily.   Zinc Acetate, Oral, (ZINC ACETATE PO) Take 1 tablet by mouth daily.   No facility-administered encounter medications on file as of 12/11/2023.     Patient Active Problem List   Diagnosis Date Noted   Arthritis 04/12/2023   Anxiety 04/12/2023   Depression 04/12/2023   Fibromyalgia, primary 04/12/2023   Hypothyroidism 04/12/2023   Lumbar radiculopathy 04/12/2023   Osteoporosis 04/12/2023   Epidural abscess 04/04/2023   Infection of cervical spine (HCC) 04/03/2023   Therapeutic drug monitoring 04/03/2023   Abscess in  epidural space of thoracic spine 03/31/2023   Hardware complicating wound infection (HCC) 03/31/2023   Discitis of cervicothoracic region 03/31/2023   Mitral valve insufficiency 03/31/2023   MSSA bacteremia 03/29/2023   OSA (obstructive sleep apnea) 03/29/2023   Hypokalemia 03/29/2023   Palpitations 12/28/2022   HTN (hypertension) 12/28/2022   Non-cardiac chest pain 12/28/2022   HLD (hyperlipidemia) 12/28/2022     Health Maintenance Due  Topic Date Due   COVID-19 Vaccine (1) Never done   DTaP/Tdap/Td (1 - Tdap) Never done   Pneumococcal Vaccine: 50+ Years (1 of 2 - PCV) Never done   Hepatitis B Vaccines 19-59 Average Risk (1 of 3 - 19+ 3-dose series) Never done   Zoster Vaccines- Shingrix (1 of 2) Never done   Cervical Cancer Screening (HPV/Pap Cotest)  Never done   Mammogram  Never done   Influenza Vaccine  10/27/2023     Review of Systems SI joint pain (right side>>); also new right mcp pain. 12 point ros otherwise negative Physical Exam   BP (!) 151/52   Pulse 92   Temp 98 F (36.7 C) (Oral)   Wt 262 lb 12.8 oz (119.2 kg)   LMP 07/26/2020 Comment: denies possibility of pregnancy  SpO2 96%   BMI 46.55 kg/m    Physical Exam  Constitutional:  oriented to person, place, and time. appears well-developed and well-nourished. No distress.  HENT: [place]/AT, PERRLA, no scleral icterus Mouth/Throat: Oropharynx is clear and moist. No oropharyngeal exudate.  Cardiovascular: Normal rate, regular rhythm and normal heart sounds. Exam reveals no gallop and no friction rub.  No murmur heard.  Pulmonary/Chest: Effort normal and breath sounds normal. No respiratory distress.  has no wheezes.  Neck = supple, no nuchal rigidity Lymphadenopathy: no cervical adenopathy. No axillary adenopathy Neurological: alert and oriented to person, place, and time.  Skin: Skin is warm and dry. No rash noted. No erythema.  Psychiatric: a normal mood and affect.  behavior is normal.    CBC Lab Results   Component Value Date   WBC 9.4 09/15/2023   RBC 3.89 09/15/2023   HGB 11.4 (L) 09/15/2023   HCT 36.1 09/15/2023   PLT 254 09/15/2023   MCV 92.8 09/15/2023   MCH 29.3 09/15/2023   MCHC 31.6 (L) 09/15/2023   RDW 13.1 09/15/2023   LYMPHSABS 3.5 04/05/2023   MONOABS 0.8 04/05/2023   EOSABS 188 09/15/2023    BMET Lab Results  Component Value Date   NA 137 09/15/2023   K 4.6 09/15/2023   CL 101 09/15/2023   CO2 26 09/15/2023   GLUCOSE 106 (H) 09/15/2023   BUN 23 09/15/2023   CREATININE 1.24 (H) 09/15/2023   CALCIUM 9.5 09/15/2023   GFRNONAA >60 04/05/2023    Lab Results  Component Value Date   ESRSEDRATE 14  09/15/2023   Lab Results  Component Value Date   CRP <3.0 09/15/2023   Lab Results  Component Value Date   ESRSEDRATE 17 12/11/2023   Lab Results  Component Value Date   CRP 3.5 12/11/2023     Assessment and Plan  Hx of MSSA epidural abscess/vertebral om = will check inflammatory markers to see that still WNL. It maybe slightly elevated since she reports increasing joint pains, mostly right hand. She remains off of abtx for the past 3 months. For now would like to continue to monitor her off of abtx for a few more months.  ? Concenred about having rheumatoid arthritis -- see right hand arthritis. Known to have oa of hip. Will see if can refer to   [facility] at [place] [facility]   Health maintenance = gave the flu shot today  Rtc in 3 months

## 2023-12-12 LAB — C-REACTIVE PROTEIN: CRP: 3.5 mg/L (ref <8.0)

## 2023-12-12 LAB — SEDIMENTATION RATE: Sed Rate: 17 mm/h (ref 0–30)

## 2024-02-08 ENCOUNTER — Other Ambulatory Visit: Payer: Self-pay | Admitting: Family Medicine

## 2024-02-08 NOTE — Telephone Encounter (Signed)
 Copied from CRM #[id number]. Topic: Clinical - Medication Refill >> Feb 08, 2024  2:14 PM Taleah C wrote: Medication: metformin   Has the patient contacted their pharmacy? No  NO. Pt has a NP OV next week but will run out by the 20th and is inquiring if Dr. Mcgowen could send a short term supply til her appt. Her Previous PCP has retired.  This is the patient's preferred pharmacy:  [facility] [address] [place] - [address] [place] [address] Phone: [phone] Fax: [phone]  Is this the correct pharmacy for this prescription? Yes If no, delete pharmacy and type the correct one.   Has the prescription been filled recently? No  Is the patient out of the medication? No Will be out by Monday  Has the patient been seen for an appointment in the last year OR does the patient have an upcoming appointment? Yes  Can we respond through MyChart? Yes  Agent: Please be advised that Rx refills may take up to 3 business days. We ask that you follow-up with your pharmacy.

## 2024-02-13 ENCOUNTER — Telehealth: Payer: Self-pay

## 2024-02-13 NOTE — Telephone Encounter (Signed)
 Copied from CRM #[id number]. Topic: Clinical - Medication Question >> Feb 13, 2024 11:20 AM Rea ORN wrote: Reason for CRM: Pt calling to get status of rx request that was sent in on 11/13. Pt has run out of Metformin  now and previous PCP has retired.   Please call back [phone]

## 2024-02-14 NOTE — Telephone Encounter (Signed)
 Unable to refill medication before establish care appt tomorrow.

## 2024-02-15 ENCOUNTER — Encounter: Payer: Self-pay | Admitting: Family Medicine

## 2024-02-15 ENCOUNTER — Ambulatory Visit: Payer: Self-pay | Admitting: Family Medicine

## 2024-02-15 ENCOUNTER — Ambulatory Visit (INDEPENDENT_AMBULATORY_CARE_PROVIDER_SITE_OTHER): Admitting: Family Medicine

## 2024-02-15 VITALS — BP 126/74 | HR 74 | Temp 98.1°F | Ht 63.15 in | Wt 260.2 lb

## 2024-02-15 DIAGNOSIS — Z1231 Encounter for screening mammogram for malignant neoplasm of breast: Secondary | ICD-10-CM | POA: Diagnosis not present

## 2024-02-15 DIAGNOSIS — Z23 Encounter for immunization: Secondary | ICD-10-CM | POA: Diagnosis not present

## 2024-02-15 DIAGNOSIS — Z7984 Long term (current) use of oral hypoglycemic drugs: Secondary | ICD-10-CM | POA: Diagnosis not present

## 2024-02-15 DIAGNOSIS — E039 Hypothyroidism, unspecified: Secondary | ICD-10-CM

## 2024-02-15 DIAGNOSIS — M19042 Primary osteoarthritis, left hand: Secondary | ICD-10-CM

## 2024-02-15 DIAGNOSIS — E119 Type 2 diabetes mellitus without complications: Secondary | ICD-10-CM | POA: Diagnosis not present

## 2024-02-15 DIAGNOSIS — Z124 Encounter for screening for malignant neoplasm of cervix: Secondary | ICD-10-CM | POA: Diagnosis not present

## 2024-02-15 DIAGNOSIS — E7800 Pure hypercholesterolemia, unspecified: Secondary | ICD-10-CM | POA: Diagnosis not present

## 2024-02-15 DIAGNOSIS — M461 Sacroiliitis, not elsewhere classified: Secondary | ICD-10-CM

## 2024-02-15 DIAGNOSIS — Z0000 Encounter for general adult medical examination without abnormal findings: Secondary | ICD-10-CM

## 2024-02-15 DIAGNOSIS — E1142 Type 2 diabetes mellitus with diabetic polyneuropathy: Secondary | ICD-10-CM

## 2024-02-15 DIAGNOSIS — M19041 Primary osteoarthritis, right hand: Secondary | ICD-10-CM

## 2024-02-15 LAB — LIPID PANEL
Cholesterol: 185 mg/dL (ref 0–200)
HDL: 50.9 mg/dL (ref >39.00)
LDL Cholesterol: 115 mg/dL — ABNORMAL HIGH (ref 0–99)
NonHDL: 134.18
Total CHOL/HDL Ratio: 4
Triglycerides: 95 mg/dL (ref 0.0–149.0)
VLDL: 19 mg/dL (ref 0.0–40.0)

## 2024-02-15 LAB — COMPREHENSIVE METABOLIC PANEL WITH GFR
ALT: 44 U/L — ABNORMAL HIGH (ref 0–35)
AST: 30 U/L (ref 0–37)
Albumin: 4.3 g/dL (ref 3.5–5.2)
Alkaline Phosphatase: 77 U/L (ref 39–117)
BUN: 25 mg/dL — ABNORMAL HIGH (ref 6–23)
CO2: 28 meq/L (ref 19–32)
Calcium: 9 mg/dL (ref 8.4–10.5)
Chloride: 103 meq/L (ref 96–112)
Creatinine, Ser: 0.87 mg/dL (ref 0.40–1.20)
GFR: 75.49 mL/min (ref >60.00)
Glucose, Bld: 135 mg/dL — ABNORMAL HIGH (ref 70–99)
Potassium: 4.4 meq/L (ref 3.5–5.1)
Sodium: 138 meq/L (ref 135–145)
Total Bilirubin: 0.4 mg/dL (ref 0.2–1.2)
Total Protein: 7 g/dL (ref 6.0–8.3)

## 2024-02-15 LAB — CBC WITH DIFFERENTIAL/PLATELET
Basophils Absolute: 0 K/uL (ref 0.0–0.1)
Basophils Relative: 0.6 % (ref 0.0–3.0)
Eosinophils Absolute: 0.1 K/uL (ref 0.0–0.7)
Eosinophils Relative: 1.9 % (ref 0.0–5.0)
HCT: 36.4 % (ref 36.0–46.0)
Hemoglobin: 12 g/dL (ref 12.0–15.0)
Lymphocytes Relative: 43.4 % (ref 12.0–46.0)
Lymphs Abs: 3 K/uL (ref 0.7–4.0)
MCHC: 33 g/dL (ref 30.0–36.0)
MCV: 90.7 fl (ref 78.0–100.0)
Monocytes Absolute: 0.5 K/uL (ref 0.1–1.0)
Monocytes Relative: 7.6 % (ref 3.0–12.0)
Neutro Abs: 3.3 K/uL (ref 1.4–7.7)
Neutrophils Relative %: 46.5 % (ref 43.0–77.0)
Platelets: 229 K/uL (ref 150.0–400.0)
RBC: 4.02 Mil/uL (ref 3.87–5.11)
RDW: 14.1 % (ref 11.5–15.5)
WBC: 7 K/uL (ref 4.0–10.5)

## 2024-02-15 LAB — TSH: TSH: 1.27 u[IU]/mL (ref 0.35–5.50)

## 2024-02-15 LAB — HEMOGLOBIN A1C: Hgb A1c MFr Bld: 7.7 % — ABNORMAL HIGH (ref 4.6–6.5)

## 2024-02-15 MED ORDER — BETAMETHASONE DIPROPIONATE 0.05 % EX CREA: 1.0000 | TOPICAL_CREAM | Freq: Two times a day (BID) | CUTANEOUS | 5 refills | Status: AC | PRN

## 2024-02-15 MED ORDER — FUROSEMIDE 40 MG PO TABS: 40.0000 mg | ORAL_TABLET | Freq: Two times a day (BID) | ORAL | 3 refills | Status: AC

## 2024-02-15 MED ORDER — SIMVASTATIN 40 MG PO TABS: 40.0000 mg | ORAL_TABLET | Freq: Every day | ORAL | 3 refills | Status: AC

## 2024-02-15 MED ORDER — OLMESARTAN MEDOXOMIL 40 MG PO TABS: 40.0000 mg | ORAL_TABLET | Freq: Every day | ORAL | 3 refills | Status: AC

## 2024-02-15 MED ORDER — BUPROPION HCL ER (XL) 300 MG PO TB24: 300.0000 mg | ORAL_TABLET | Freq: Every day | ORAL | 3 refills | Status: AC

## 2024-02-15 MED ORDER — NYSTATIN 100000 UNIT/GM EX CREA: 1.0000 | TOPICAL_CREAM | Freq: Two times a day (BID) | CUTANEOUS | 3 refills | Status: AC | PRN

## 2024-02-15 MED ORDER — AMITRIPTYLINE HCL 25 MG PO TABS: 25.0000 mg | ORAL_TABLET | Freq: Every day | ORAL | 3 refills | Status: AC

## 2024-02-15 MED ORDER — ALPRAZOLAM 0.25 MG PO TABS: 0.2500 mg | ORAL_TABLET | Freq: Three times a day (TID) | ORAL | 5 refills | Status: AC | PRN

## 2024-02-15 MED ORDER — KETOCONAZOLE 2 % EX CREA: TOPICAL_CREAM | Freq: Two times a day (BID) | CUTANEOUS | 3 refills | Status: AC

## 2024-02-15 MED ORDER — METFORMIN HCL 1000 MG PO TABS: 1000.0000 mg | ORAL_TABLET | Freq: Two times a day (BID) | ORAL | 3 refills | Status: AC

## 2024-02-15 MED ORDER — PANTOPRAZOLE SODIUM 40 MG PO TBEC: 40.0000 mg | DELAYED_RELEASE_TABLET | Freq: Every day | ORAL | 3 refills | Status: AC

## 2024-02-15 MED ORDER — DULOXETINE HCL 60 MG PO CPEP: 60.0000 mg | ORAL_CAPSULE | Freq: Every day | ORAL | 3 refills | Status: AC

## 2024-02-15 MED ORDER — CYCLOBENZAPRINE HCL 10 MG PO TABS: 10.0000 mg | ORAL_TABLET | Freq: Three times a day (TID) | ORAL | 3 refills | Status: AC | PRN

## 2024-02-15 MED ORDER — LEVOTHYROXINE SODIUM 100 MCG PO TABS: 100.0000 ug | ORAL_TABLET | Freq: Every day | ORAL | 3 refills | Status: AC

## 2024-02-15 MED ORDER — OXYCODONE HCL 10 MG PO TABS: 10.0000 mg | ORAL_TABLET | Freq: Four times a day (QID) | ORAL | 0 refills | Status: DC | PRN

## 2024-02-15 MED ORDER — FLUCONAZOLE 150 MG PO TABS
150.0000 mg | ORAL_TABLET | ORAL | 0 refills | Status: AC | PRN
Start: 2024-02-15 — End: ?

## 2024-02-15 MED ORDER — LIDOCAINE 5 % EX PTCH: 1.0000 | MEDICATED_PATCH | CUTANEOUS | 3 refills | Status: AC

## 2024-02-15 MED ORDER — FAMOTIDINE 20 MG PO TABS: 20.0000 mg | ORAL_TABLET | Freq: Every day | ORAL | 3 refills | Status: AC

## 2024-02-15 MED ORDER — ROPINIROLE HCL 3 MG PO TABS: ORAL_TABLET | ORAL | 3 refills | Status: AC

## 2024-02-15 MED ORDER — GABAPENTIN 300 MG PO CAPS
900.0000 mg | ORAL_CAPSULE | Freq: Every day | ORAL | 3 refills | Status: AC
Start: 2024-02-15 — End: ?

## 2024-02-15 MED ORDER — DULOXETINE HCL 30 MG PO CPEP: 30.0000 mg | ORAL_CAPSULE | Freq: Every day | ORAL | 3 refills | Status: AC

## 2024-02-15 NOTE — Progress Notes (Signed)
 Office Note 02/24/2024  CC:  Chief Complaint  Patient presents with   Establish Care   Medical Management of Chronic Issues    HPI:  Debbie Goodwin is a 53 y.o. female who is here to establish care, CPE, follow-up multiple medical problems. Patient's most recent primary MD: Dr. Jerilynn Carnes in [place] Old records in epic/health Link EMR were reviewed prior to or during today's visit.  Currently feeling well.  She is disabled due to chronic pain-> cervical, thoracic, and lumbar spondylosis, osteoarthritis multiple sites, fibromyalgia. She had an episode of MSSA discitis/epidural abscess and was treated with a long course of antibiotics earlier this year.  She has not been on antibiotics in about the last 6 months.    PMP AWARE reviewed today: most recent rx for gabapentin  was filled 01/23/2024, # 90, rx by Dr. Carnes.  Most recent alprazolam  prescription was filled 11/17/2023, #90, prescription by Dr. Carnes. No red flags.  Past Medical History:  Diagnosis Date   Anxiety and depression    Bilateral primary osteoarthritis of hip    Chronic low back pain    Chronic mid back pain    Chronic neck pain    Chronic pain syndrome    Diabetes mellitus without complication [facility]) April 2024   Discitis    MSSA   Fibromyalgia    GERD (gastroesophageal reflux disease)    Hepatic steatosis    03/2023 CT abd-->hepatomegaly and nodular liver contour susp for cirrhosis  +splenomegaly.  Viral hep serol NEG.   High risk medication use    opioids for chronic pain   HLD (hyperlipidemia)    Hypertension 2000   Hypothyroid    Mild mitral insufficiency    03/2023   OSA (obstructive sleep apnea)    RLS (restless legs syndrome)    Sacroiliitis    Status post dilation of esophageal narrowing     Past Surgical History:  Procedure Laterality Date   CARPAL TUNNEL RELEASE Bilateral 2016   CERVICAL SPINE SURGERY     ACDF 01/2021   CHOLECYSTECTOMY     2001   COLONOSCOPY     2022 NORMAL    ESOPHAGOGASTRODUODENOSCOPY     08/2020 NORMAL   LASIK Bilateral    PLANTAR FASCIA SURGERY Right 1998   TRANSTHORACIC ECHOCARDIOGRAM     03/2023 mild mitral regurg.  Otherwise NORMAL    Family History  Problem Relation Age of Onset   Hypertension Mother    Hyperlipidemia Mother    Colon polyps Father    Heart attack Father        Passed in 2000   Hypertension Father    Hyperlipidemia Father    Heart attack Maternal Grandmother        Heart Attack   Stroke Paternal Grandfather    Heart attack Brother        HBP   Heart disease Maternal Uncle    Colon cancer Neg Hx    Esophageal cancer Neg Hx     Social History   Socioeconomic History   Marital status: Married    Spouse name: Not on file   Number of children: 0   Years of education: Not on file   Highest education level: Not on file  Occupational History   Occupation: repiratory therapist  Tobacco Use   Smoking status: Never   Smokeless tobacco: Never  Vaping Use   Vaping status: Never Used  Substance and Sexual Activity   Alcohol  use: No   Drug use: No  Sexual activity: Not on file  Other Topics Concern   Not on file  Social History Narrative   Married, no children.   From [place] -> near [place] and [place].   Education: Masters degree.   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   No tobacco or alcohol    Social Drivers of Corporate Investment Banker Strain: Not on file  Food Insecurity: No Food Insecurity (03/30/2023)   Hunger Vital Sign    Worried About Running Out of Food in the Last Year: Never true    Ran Out of Food in the Last Year: Never true  Transportation Needs: No Transportation Needs (03/30/2023)   PRAPARE - Administrator, Civil Service (Medical): No    Lack of Transportation (Non-Medical): No  Physical Activity: Not on file  Stress: Not on file  Social Connections: Not on file  Intimate Partner Violence: Not At Risk (03/30/2023)    Humiliation, Afraid, Rape, and Kick questionnaire    Fear of Current or Ex-Partner: No    Emotionally Abused: No    Physically Abused: No    Sexually Abused: No    Outpatient Encounter Medications as of 02/15/2024  Medication Sig   Blood Glucose Monitoring Suppl DEVI 1 each by Does not apply route in the morning, at noon, and at bedtime. May substitute to any manufacturer covered by patient's insurance.   Cholecalciferol (VITAMIN D-3 PO) Take 1 capsule by mouth daily.   Flaxseed, Linseed, (FLAXSEED OIL PO) Take 1 capsule by mouth daily.   ibuprofen (ADVIL,MOTRIN) 200 MG tablet Take 800 mg by mouth every 6 (six) hours as needed for moderate pain.   mometasone (ELOCON) 0.1 % ointment Apply 1 Application topically daily.   Omega-3 Fatty Acids (FISH OIL) 300 MG CAPS Take 1 capsule by mouth daily.   polyvinyl alcohol  (LIQUIFILM TEARS) 1.4 % ophthalmic solution Place 1 drop into both eyes as needed for dry eyes.   Zinc Acetate, Oral, (ZINC ACETATE PO) Take 1 tablet by mouth daily.   [DISCONTINUED] ALPRAZolam  (XANAX ) 0.25 MG tablet Take 0.25 mg by mouth 3 (three) times daily as needed for anxiety.   [DISCONTINUED] amitriptyline  (ELAVIL ) 25 MG tablet Take 25-50 mg by mouth at bedtime.   [DISCONTINUED] betamethasone  dipropionate 0.05 % cream Apply 1 Application topically 2 (two) times daily as needed (rash).   [DISCONTINUED] buPROPion  (WELLBUTRIN  XL) 300 MG 24 hr tablet Take 300 mg by mouth daily.   [DISCONTINUED] cefadroxil  (DURICEF) 500 MG capsule Take 2 capsules (1,000 mg total) by mouth 2 (two) times daily. To start day after IV abtx has stopped   [DISCONTINUED] cyclobenzaprine  (FLEXERIL ) 10 MG tablet Take 10 mg by mouth 3 (three) times daily as needed for muscle spasms.   [DISCONTINUED] DULoxetine  (CYMBALTA ) 30 MG capsule Take 30 mg by mouth daily.   [DISCONTINUED] DULoxetine  (CYMBALTA ) 60 MG capsule Take 60 mg by mouth daily.   [DISCONTINUED] empagliflozin (JARDIANCE) 10 MG TABS tablet Take by  mouth daily.   [DISCONTINUED] famotidine  (PEPCID ) 20 MG tablet Take 1 tablet (20 mg total) by mouth at bedtime.   [DISCONTINUED] fluconazole  (DIFLUCAN ) 150 MG tablet Take 1 tablet (150 mg total) by mouth every three (3) days as needed (yeast infection treatment/prevention).   [DISCONTINUED] furosemide  (LASIX ) 40 MG tablet Take 40 mg by mouth 2 (two) times daily.   [DISCONTINUED] gabapentin  (NEURONTIN ) 300 MG capsule Take 900 mg by mouth daily.   [DISCONTINUED] ketoconazole  (NIZORAL ) 2 % cream Apply topically 2 (two) times daily.   [  DISCONTINUED] levothyroxine  (SYNTHROID ) 100 MCG tablet Take 100 mcg by mouth daily.   [DISCONTINUED] lidocaine  (LIDODERM ) 5 % Place 1 patch onto the skin daily. Remove & Discard patch within 12 hours or as directed by MD   [DISCONTINUED] metFORMIN  (GLUCOPHAGE ) 1000 MG tablet Take 1,000 mg by mouth 2 (two) times daily.   [DISCONTINUED] methylPREDNISolone (MEDROL DOSEPAK) 4 MG TBPK tablet Take 4 mg by mouth as directed. (Patient taking differently: Take 4 mg by mouth as needed.)   [DISCONTINUED] nystatin  cream (MYCOSTATIN ) Apply 1 Application topically 2 (two) times daily as needed.   [DISCONTINUED] olmesartan  (BENICAR ) 40 MG tablet Take 40 mg by mouth daily.   [DISCONTINUED] Oxycodone  HCl 10 MG TABS Take 1 tablet (10 mg total) by mouth every 6 (six) hours as needed (pain).   [DISCONTINUED] pantoprazole  (PROTONIX ) 40 MG tablet Take 40 mg by mouth daily.   [DISCONTINUED] rOPINIRole  (REQUIP ) 1 MG tablet Take 1 mg by mouth as needed (restless legs).   [DISCONTINUED] rOPINIRole  (REQUIP ) 3 MG tablet Take 3 mg by mouth daily.   [DISCONTINUED] simvastatin  (ZOCOR ) 40 MG tablet Take 40 mg by mouth daily. (Patient taking differently: Take 40 mg by mouth as needed.)   ALPRAZolam  (XANAX ) 0.25 MG tablet Take 1 tablet (0.25 mg total) by mouth 3 (three) times daily as needed for anxiety.   amitriptyline  (ELAVIL ) 25 MG tablet Take 1-2 tablets (25-50 mg total) by mouth at bedtime.    betamethasone  dipropionate 0.05 % cream Apply 1 Application topically 2 (two) times daily as needed (rash).   buPROPion  (WELLBUTRIN  XL) 300 MG 24 hr tablet Take 1 tablet (300 mg total) by mouth daily.   cyclobenzaprine  (FLEXERIL ) 10 MG tablet Take 1 tablet (10 mg total) by mouth 3 (three) times daily as needed for muscle spasms.   DULoxetine  (CYMBALTA ) 30 MG capsule Take 1 capsule (30 mg total) by mouth daily.   DULoxetine  (CYMBALTA ) 60 MG capsule Take 1 capsule (60 mg total) by mouth daily.   famotidine  (PEPCID ) 20 MG tablet Take 1 tablet (20 mg total) by mouth at bedtime.   fluconazole  (DIFLUCAN ) 150 MG tablet Take 1 tablet (150 mg total) by mouth every three (3) days as needed (yeast infection treatment/prevention).   furosemide  (LASIX ) 40 MG tablet Take 1 tablet (40 mg total) by mouth 2 (two) times daily.   gabapentin  (NEURONTIN ) 300 MG capsule Take 3 capsules (900 mg total) by mouth daily.   ketoconazole  (NIZORAL ) 2 % cream Apply topically 2 (two) times daily.   levothyroxine  (SYNTHROID ) 100 MCG tablet Take 1 tablet (100 mcg total) by mouth daily.   lidocaine  (LIDODERM ) 5 % Place 1 patch onto the skin daily. Remove & Discard patch within 12 hours or as directed by MD   metFORMIN  (GLUCOPHAGE ) 1000 MG tablet Take 1 tablet (1,000 mg total) by mouth 2 (two) times daily.   nystatin  cream (MYCOSTATIN ) Apply 1 Application topically 2 (two) times daily as needed.   olmesartan  (BENICAR ) 40 MG tablet Take 1 tablet (40 mg total) by mouth daily.   pantoprazole  (PROTONIX ) 40 MG tablet Take 1 tablet (40 mg total) by mouth daily.   rOPINIRole  (REQUIP ) 3 MG tablet 1 tab po bid   simvastatin  (ZOCOR ) 40 MG tablet Take 1 tablet (40 mg total) by mouth daily.   [DISCONTINUED] Oxycodone  HCl 10 MG TABS Take 1 tablet (10 mg total) by mouth every 6 (six) hours as needed (pain).   No facility-administered encounter medications on file as of 02/15/2024.    Allergies  Allergen Reactions   Sulfa Antibiotics Hives     Review of Systems  Constitutional:  Negative for appetite change, chills, fatigue and fever.  HENT:  Negative for congestion, dental problem, ear pain and sore throat.   Eyes:  Negative for discharge, redness and visual disturbance.  Respiratory:  Negative for cough, chest tightness, shortness of breath and wheezing.   Cardiovascular:  Negative for chest pain, palpitations and leg swelling.  Gastrointestinal:  Negative for abdominal pain, blood in stool, diarrhea, nausea and vomiting.  Genitourinary:  Negative for difficulty urinating, dysuria, flank pain, frequency, hematuria and urgency.  Musculoskeletal:  Negative for arthralgias, back pain, joint swelling, myalgias and neck stiffness.  Skin:  Negative for pallor and rash.  Neurological:  Negative for dizziness, speech difficulty, weakness and headaches.  Hematological:  Negative for adenopathy. Does not bruise/bleed easily.  Psychiatric/Behavioral:  Negative for confusion and sleep disturbance. The patient is not nervous/anxious.     PE; Blood pressure 126/74, pulse 74, temperature 98.1 F (36.7 C), temperature source Temporal, height 5' 3.15 (1.604 m), weight 260 lb 3.2 oz (118 kg), last menstrual period 07/26/2020, SpO2 100%. Body mass index is 45.87 kg/m.  Physical Exam  Gen: Alert, well appearing.  Patient is oriented to person, place, time, and situation. AFFECT: pleasant, lucid thought and speech. ENT: Ears: EACs clear, normal epithelium.  TMs with good light reflex and landmarks bilaterally.  Eyes: no injection, icteris, swelling, or exudate.  EOMI, PERRLA. Nose: no drainage or turbinate edema/swelling.  No injection or focal lesion.  Mouth: lips without lesion/swelling.  Oral mucosa pink and moist.  Dentition intact and without obvious caries or gingival swelling.  Oropharynx without erythema, exudate, or swelling.  Neck: supple/nontender.  No LAD, mass, or TM.  Carotid pulses 2+ bilaterally, without bruits. CV: RRR, no  m/r/g.   LUNGS: CTA bilat, nonlabored resps, good aeration in all lung fields. ABD: soft, NT, ND, BS normal.  No hepatospenomegaly or mass.  No bruits. EXT: no clubbing, cyanosis, or edema.  Musculoskeletal: no joint swelling, erythema, warmth, or tenderness.  ROM of all joints intact. Skin - no sores or suspicious lesions or rashes or color changes  Pertinent labs:  Last CBC Lab Results  Component Value Date   WBC 7.0 02/15/2024   HGB 12.0 02/15/2024   HCT 36.4 02/15/2024   MCV 90.7 02/15/2024   MCH 29.3 09/15/2023   RDW 14.1 02/15/2024   PLT 229.0 02/15/2024   Last metabolic panel Lab Results  Component Value Date   GLUCOSE 135 (H) 02/15/2024   NA 138 02/15/2024   K 4.4 02/15/2024   CL 103 02/15/2024   CO2 28 02/15/2024   BUN 25 (H) 02/15/2024   CREATININE 0.87 02/15/2024   EGFR 52 (L) 09/15/2023   CALCIUM 9.0 02/15/2024   PHOS 4.1 04/05/2023   PROT 7.0 02/15/2024   ALBUMIN 4.3 02/15/2024   BILITOT 0.4 02/15/2024   ALKPHOS 77 02/15/2024   AST 30 02/15/2024   ALT 44 (H) 02/15/2024   ANIONGAP 12 04/05/2023   Last hemoglobin A1c Lab Results  Component Value Date   HGBA1C 7.7 (H) 02/15/2024   Last thyroid  functions Lab Results  Component Value Date   TSH 1.27 02/15/2024   FREET4 1.53 (H) 07/05/2022   ASSESSMENT AND PLAN:   New patient, establishing care.  1.  Health maintenance exam: Reviewed age and gender appropriate health maintenance issues (prudent diet, regular exercise, health risks of tobacco and excessive alcohol , use of seatbelts, fire alarms in  home, use of sunscreen).  Also reviewed age and gender appropriate health screening as well as vaccine recommendations. Vaccines: Shingrix  #1 and prevnar 20 today. Labs: Health panel, hemoglobin A1c Cervical ca screening: ref to Dr. Leva Breast ca screening: mammogram ordered Colon ca screening: 2022 TCS normal.  Rpt 2032.  #2 diabetes without complication. Continue metformin  1000 mg twice  daily. Monitor hemoglobin A1c today. Urine microalbumin/creatinine at next follow-up.  3.  Hypertension, well-controlled on olmesartan  40 mg a day. Basic metabolic panel today.  4.  Hyperlipidemia, tolerating simvastatin  40 mg a day. Lipid panel and hepatic panel today.  #5 acquired hypothyroidism. Continue levothyroxine  100 mcg/day. TSH monitoring today.  #6 chronic pain syndrome--> fibromyalgia, osteoarthritis of spine and hips. Has been reasonably well-controlled on the following regimen: Amitriptyline  50 mg a day, duloxetine  90 mg a day, gabapentin  300 mg 3 times daily.  #7 anxiety and depression. She has been on Wellbutrin  300 mg a day and duloxetine  90 mg a day long-term.  Will continue.  #8 sacroiliitis +arthritis bilat hands. Will do ANA panel and HLA-B27. Consider future referral to rheumatology.  An After Visit Summary was printed and given to the patient.  Return in about 3 months (around 05/17/2024) for routine chronic illness f/u.  Signed:  Gerlene Hockey, MD           02/24/2024

## 2024-02-16 ENCOUNTER — Encounter: Payer: Self-pay | Admitting: Family Medicine

## 2024-02-19 ENCOUNTER — Other Ambulatory Visit (HOSPITAL_COMMUNITY): Payer: Self-pay

## 2024-02-19 ENCOUNTER — Telehealth: Payer: Self-pay

## 2024-02-19 NOTE — Telephone Encounter (Signed)
 Pharmacy Patient Advocate Encounter   Received notification from Onbase that prior authorization for Oxycodone  HCl 10 MG TABS  is required/requested.   Insurance verification completed.   The patient is insured through [employer].   Per test claim: PA required; PA submitted to above mentioned insurance via Latent Key/confirmation #/EOC [id number] Status is pending

## 2024-02-20 ENCOUNTER — Other Ambulatory Visit: Payer: Self-pay

## 2024-02-20 MED ORDER — OXYCODONE HCL 10 MG PO TABS: 10.0000 mg | ORAL_TABLET | Freq: Four times a day (QID) | ORAL | 0 refills | Status: AC | PRN

## 2024-02-20 NOTE — Telephone Encounter (Signed)
 No further action needed at this time.

## 2024-02-20 NOTE — Telephone Encounter (Signed)
 Okay, new prescription sent with diagnosis codes listed

## 2024-02-20 NOTE — Telephone Encounter (Signed)
 Pharmacy sent fax requesting ICD-10 dx for Oxycodone  rx sent on 11/20.  Please further advise. Rx pending, new rx will be needed

## 2024-02-20 NOTE — Telephone Encounter (Signed)
 Pharmacy Patient Advocate Encounter  Received notification from [facility] MEDICAID that Prior Authorization for oxyCODONE  HCl 10MG  tablets  has been APPROVED from 02/19/2024 to 08/19/2024   PA #/Case ID/Reference #: Request Reference Number: [id number]. OXYCODONE  TAB 10MG  is approved through 08/19/2024. Your patient may now fill this prescription and it will be covered.

## 2024-02-21 LAB — ANA SCREEN,IFA,REFLEX TITER/PATTERN,REFLEX MPLX 11 AB CASCADE
Anti Nuclear Antibody (ANA): NEGATIVE
Cyclic Citrullin Peptide Ab: <16 U
MUTATED CITRULLINATED VIMENTIN (MCV) AB: <20 U/mL (ref <20)
Rheumatoid fact SerPl-aCnc: <10 [IU]/mL (ref <14)

## 2024-02-24 ENCOUNTER — Encounter: Payer: Self-pay | Admitting: Family Medicine

## 2024-02-25 MED ORDER — TIRZEPATIDE 2.5 MG/0.5ML ~~LOC~~ SOAJ: 2.5000 mg | SUBCUTANEOUS | 0 refills | Status: DC

## 2024-02-26 NOTE — Telephone Encounter (Signed)
 No further action needed at this time.

## 2024-02-26 NOTE — Telephone Encounter (Signed)
 No further action needed.

## 2024-03-11 ENCOUNTER — Ambulatory Visit: Admitting: Internal Medicine

## 2024-03-22 ENCOUNTER — Encounter: Payer: Self-pay | Admitting: *Deleted

## 2024-03-25 ENCOUNTER — Encounter: Payer: Self-pay | Admitting: Internal Medicine

## 2024-03-25 ENCOUNTER — Other Ambulatory Visit: Payer: Self-pay

## 2024-03-25 ENCOUNTER — Ambulatory Visit: Payer: Self-pay | Admitting: Family Medicine

## 2024-03-25 ENCOUNTER — Ambulatory Visit: Admitting: Internal Medicine

## 2024-03-25 ENCOUNTER — Ambulatory Visit: Admitting: Family Medicine

## 2024-03-25 ENCOUNTER — Encounter: Payer: Self-pay | Admitting: Family Medicine

## 2024-03-25 VITALS — BP 100/64 | HR 94 | Temp 97.7°F | Ht 63.15 in | Wt 255.6 lb

## 2024-03-25 VITALS — BP 117/72 | HR 92 | Temp 97.7°F | Ht 63.0 in | Wt 255.0 lb

## 2024-03-25 DIAGNOSIS — E119 Type 2 diabetes mellitus without complications: Secondary | ICD-10-CM | POA: Diagnosis not present

## 2024-03-25 DIAGNOSIS — Z7985 Long-term (current) use of injectable non-insulin antidiabetic drugs: Secondary | ICD-10-CM

## 2024-03-25 DIAGNOSIS — M4643 Discitis, unspecified, cervicothoracic region: Secondary | ICD-10-CM

## 2024-03-25 DIAGNOSIS — Z0001 Encounter for general adult medical examination with abnormal findings: Secondary | ICD-10-CM | POA: Diagnosis not present

## 2024-03-25 DIAGNOSIS — Z7984 Long term (current) use of oral hypoglycemic drugs: Secondary | ICD-10-CM | POA: Diagnosis not present

## 2024-03-25 LAB — MICROALBUMIN / CREATININE URINE RATIO
Creatinine,U: 107.1 mg/dL
Microalb Creat Ratio: 6.6 mg/g (ref 0.0–30.0)
Microalb, Ur: 0.7 mg/dL (ref 0.7–1.9)

## 2024-03-25 MED ORDER — TIRZEPATIDE 5 MG/0.5ML ~~LOC~~ SOAJ: 5.0000 mg | SUBCUTANEOUS | 1 refills | Status: AC

## 2024-03-25 NOTE — Progress Notes (Unsigned)
 "     RFV: follow up for MSSA epidural infection/discitis  Patient ID: Debbie Goodwin, female   DOB: [birth year], 53 y.o.   MRN: [id number]  HPI Debbie Goodwin is a 53yo F with Hx of MSSA epidural infection/discitis where she was on treatment for 5 months, now off of abtx since summer. Since we last saw her in September she has established care with Dr Candise in nov 2025. During her wellcheck, hgba1c at 7.7; did send auto immune work up for arthritis which was negative; she was started on monjauro which has led to 10# weight loss. The first week of injection had nausea but now improved.  At our last appt her inflammatory markers while off of abtx were WNL. Did not suspect need for restarting antibiotics at that time.   If she does excessive physical activity --> notices some low back pain.  Utd on vaccines: flu, pna, and shingles #1  Outpatient Encounter Medications as of 03/25/2024  Medication Sig   ALPRAZolam  (XANAX ) 0.25 MG tablet Take 1 tablet (0.25 mg total) by mouth 3 (three) times daily as needed for anxiety.   amitriptyline  (ELAVIL ) 25 MG tablet Take 1-2 tablets (25-50 mg total) by mouth at bedtime.   betamethasone  dipropionate 0.05 % cream Apply 1 Application topically 2 (two) times daily as needed (rash).   Blood Glucose Monitoring Suppl DEVI 1 each by Does not apply route in the morning, at noon, and at bedtime. May substitute to any manufacturer covered by patient's insurance.   buPROPion  (WELLBUTRIN  XL) 300 MG 24 hr tablet Take 1 tablet (300 mg total) by mouth daily.   Cholecalciferol (VITAMIN D-3 PO) Take 1 capsule by mouth daily.   cyclobenzaprine  (FLEXERIL ) 10 MG tablet Take 1 tablet (10 mg total) by mouth 3 (three) times daily as needed for muscle spasms.   DULoxetine  (CYMBALTA ) 30 MG capsule Take 1 capsule (30 mg total) by mouth daily.   DULoxetine  (CYMBALTA ) 60 MG capsule Take 1 capsule (60 mg total) by mouth daily.   famotidine  (PEPCID ) 20 MG tablet Take 1 tablet (20 mg total) by  mouth at bedtime.   Flaxseed, Linseed, (FLAXSEED OIL PO) Take 1 capsule by mouth daily.   fluconazole  (DIFLUCAN ) 150 MG tablet Take 1 tablet (150 mg total) by mouth every three (3) days as needed (yeast infection treatment/prevention).   furosemide  (LASIX ) 40 MG tablet Take 1 tablet (40 mg total) by mouth 2 (two) times daily.   gabapentin  (NEURONTIN ) 300 MG capsule Take 3 capsules (900 mg total) by mouth daily.   ibuprofen (ADVIL,MOTRIN) 200 MG tablet Take 800 mg by mouth every 6 (six) hours as needed for moderate pain.   ketoconazole  (NIZORAL ) 2 % cream Apply topically 2 (two) times daily.   levothyroxine  (SYNTHROID ) 100 MCG tablet Take 1 tablet (100 mcg total) by mouth daily.   lidocaine  (LIDODERM ) 5 % Place 1 patch onto the skin daily. Remove & Discard patch within 12 hours or as directed by MD   metFORMIN  (GLUCOPHAGE ) 1000 MG tablet Take 1 tablet (1,000 mg total) by mouth 2 (two) times daily.   mometasone (ELOCON) 0.1 % ointment Apply 1 Application topically daily.   nystatin  cream (MYCOSTATIN ) Apply 1 Application topically 2 (two) times daily as needed.   olmesartan  (BENICAR ) 40 MG tablet Take 1 tablet (40 mg total) by mouth daily.   Omega-3 Fatty Acids (FISH OIL) 300 MG CAPS Take 1 capsule by mouth daily.   Oxycodone  HCl 10 MG TABS Take 1 tablet (10 mg total)  by mouth every 6 (six) hours as needed (pain).   pantoprazole  (PROTONIX ) 40 MG tablet Take 1 tablet (40 mg total) by mouth daily.   polyvinyl alcohol  (LIQUIFILM TEARS) 1.4 % ophthalmic solution Place 1 drop into both eyes as needed for dry eyes.   rOPINIRole  (REQUIP ) 3 MG tablet 1 tab po bid   simvastatin  (ZOCOR ) 40 MG tablet Take 1 tablet (40 mg total) by mouth daily.   tirzepatide  (MOUNJARO ) 2.5 MG/0.5ML Pen Inject 2.5 mg into the skin once a week.   Zinc Acetate, Oral, (ZINC ACETATE PO) Take 1 tablet by mouth daily.   No facility-administered encounter medications on file as of 03/25/2024.     Patient Active Problem List    Diagnosis Date Noted   Arthritis 04/12/2023   Anxiety 04/12/2023   Depression 04/12/2023   Fibromyalgia, primary 04/12/2023   Hypothyroidism 04/12/2023   Lumbar radiculopathy 04/12/2023   Osteoporosis 04/12/2023   Epidural abscess 04/04/2023   Infection of cervical spine (HCC) 04/03/2023   Therapeutic drug monitoring 04/03/2023   Abscess in epidural space of thoracic spine 03/31/2023   Hardware complicating wound infection 03/31/2023   Discitis of cervicothoracic region 03/31/2023   Mitral valve insufficiency 03/31/2023   MSSA bacteremia 03/29/2023   OSA (obstructive sleep apnea) 03/29/2023   Hypokalemia 03/29/2023   Palpitations 12/28/2022   HTN (hypertension) 12/28/2022   Non-cardiac chest pain 12/28/2022   HLD (hyperlipidemia) 12/28/2022     Health Maintenance Due  Topic Date Due   COVID-19 Vaccine (1) Never done   Diabetic kidney evaluation - Urine ACR  Never done   Cervical Cancer Screening (HPV/Pap Cotest)  Never done   Mammogram  Never done     Review of Systems 12 point ros is otherwise negative Physical Exam  Physical Exam  Constitutional:  oriented to person, place, and time. appears well-developed and well-nourished. No distress.  HENT: [place]/AT, PERRLA, no scleral icterus Mouth/Throat: Oropharynx is clear and moist. No oropharyngeal exudate.  Cardiovascular: Normal rate, regular rhythm and normal heart sounds. Exam reveals no gallop and no friction rub.  No murmur heard.  Pulmonary/Chest: Effort normal and breath sounds normal. No respiratory distress.  has no wheezes.  Neurological: alert and oriented to person, place, and time.  Skin: Skin is warm and dry. No rash noted. No erythema.  Psychiatric: a normal mood and affect.  behavior is normal.   CBC Lab Results  Component Value Date   WBC 7.0 02/15/2024   RBC 4.02 02/15/2024   HGB 12.0 02/15/2024   HCT 36.4 02/15/2024   PLT 229.0 02/15/2024   MCV 90.7 02/15/2024   MCH 29.3 09/15/2023   MCHC 33.0  02/15/2024   RDW 14.1 02/15/2024   LYMPHSABS 3.0 02/15/2024   MONOABS 0.5 02/15/2024   EOSABS 0.1 02/15/2024    BMET Lab Results  Component Value Date   NA 138 02/15/2024   K 4.4 02/15/2024   CL 103 02/15/2024   CO2 28 02/15/2024   GLUCOSE 135 (H) 02/15/2024   BUN 25 (H) 02/15/2024   CREATININE 0.87 02/15/2024   CALCIUM 9.0 02/15/2024   GFRNONAA >60 04/05/2023   Lab Results  Component Value Date   ESRSEDRATE 17 12/11/2023   Lab Results  Component Value Date   CRP 3.5 12/11/2023    Lab Results  Component Value Date   ESRSEDRATE 11 03/25/2024   Lab Results  Component Value Date   CRP <3.0 03/25/2024     Assessment and Plan  Hx of mssa epidural abscess/vertebral  osteomyelitis = will check sed rate and crp to see that she is still in normal range. Anticipate that it will be and still won't need to restart abtx.  Health maintenance = 2nd shingles shot in 1-5 months.  Since has had 6 mo off of abtx, can return back to clinic as needed.   "

## 2024-03-25 NOTE — Progress Notes (Signed)
 OFFICE VISIT  03/25/2024  CC:  Chief Complaint  Patient presents with   Medical Management of Chronic Issues    Patient is a 53 y.o. female who presents for 5-week follow-up DM. A/P as of last visit: 1 diabetes without complication. Continue metformin  1000 mg twice daily. Monitor hemoglobin A1c today. Urine microalbumin/creatinine at next follow-up.   2.  Hypertension, well-controlled on olmesartan  40 mg a day. Basic metabolic panel today.   3.  Hyperlipidemia, tolerating simvastatin  40 mg a day. Lipid panel and hepatic panel today.   4 acquired hypothyroidism. Continue levothyroxine  100 mcg/day. TSH monitoring today.   5 chronic pain syndrome--> fibromyalgia, osteoarthritis of spine and hips. Has been reasonably well-controlled on the following regimen: Amitriptyline  50 mg a day, duloxetine  90 mg a day, gabapentin  300 mg 3 times daily.   #6 anxiety and depression. She has been on Wellbutrin  300 mg a day and duloxetine  90 mg a day long-term.  Will continue.   #7 sacroiliitis +arthritis bilat hands. Will do ANA panel and HLA-B27. Consider future referral to rheumatology.  INTERIM HX: Her hemoglobin A1c was 7.7% about 5 wks ago.  I started her on Mounjaro  2.5 mg weekly dosing. LDL cholesterol was 115.  We continued her on simvastatin , saving the decision to switch to a more potent statin for the near future.  Mounjaro  caused a little bit of nausea with the first injection but subsequent injections have not been a problem at all.  She does find it effective in decreasing her appetite.  She has lost about 5 pounds.  She saw infectious disease for follow-up today and the plan is for no further antibiotics as long as her inflammatory markers are negative today.   PMP AWARE reviewed today: most recent rx for alprazolam  was filled 02/16/2024, # 90, rx by me.  Most recent prescription for gabapentin  was filled 02/16/2024, #270, prescription by me. No red flags.   Past  Medical History:  Diagnosis Date   Anxiety and depression    Bilateral primary osteoarthritis of hip    Chronic low back pain    Chronic mid back pain    Chronic neck pain    Chronic pain syndrome    Diabetes mellitus without complication [facility]) April 2024   Discitis    MSSA   Fibromyalgia    GERD (gastroesophageal reflux disease)    Hepatic steatosis    03/2023 CT abd-->hepatomegaly and nodular liver contour susp for cirrhosis  +splenomegaly.  Viral hep serol NEG.   High risk medication use    opioids for chronic pain   HLD (hyperlipidemia)    Hypertension 2000   Hypothyroid    Mild mitral insufficiency    03/2023   OSA (obstructive sleep apnea)    RLS (restless legs syndrome)    Sacroiliitis     Past Surgical History:  Procedure Laterality Date   CARPAL TUNNEL RELEASE Bilateral 2016   CERVICAL SPINE SURGERY     ACDF 01/2021   CHOLECYSTECTOMY     2001   COLONOSCOPY     2022 NORMAL   ESOPHAGOGASTRODUODENOSCOPY     08/2020 NORMAL   LASIK Bilateral    PLANTAR FASCIA SURGERY Right 1998   TRANSTHORACIC ECHOCARDIOGRAM     03/2023 mild mitral regurg.  Otherwise NORMAL    Outpatient Medications Prior to Visit  Medication Sig Dispense Refill   ALPRAZolam  (XANAX ) 0.25 MG tablet Take 1 tablet (0.25 mg total) by mouth 3 (three) times daily as needed for anxiety. 90 tablet  5   amitriptyline  (ELAVIL ) 25 MG tablet Take 1-2 tablets (25-50 mg total) by mouth at bedtime. 180 tablet 3   betamethasone  dipropionate 0.05 % cream Apply 1 Application topically 2 (two) times daily as needed (rash). 30 g 5   Blood Glucose Monitoring Suppl DEVI 1 each by Does not apply route in the morning, at noon, and at bedtime. May substitute to any manufacturer covered by patient's insurance. 1 each 0   buPROPion  (WELLBUTRIN  XL) 300 MG 24 hr tablet Take 1 tablet (300 mg total) by mouth daily. 90 tablet 3   Cholecalciferol (VITAMIN D-3 PO) Take 1 capsule by mouth daily.     cyclobenzaprine  (FLEXERIL ) 10 MG  tablet Take 1 tablet (10 mg total) by mouth 3 (three) times daily as needed for muscle spasms. 90 tablet 3   DULoxetine  (CYMBALTA ) 30 MG capsule Take 1 capsule (30 mg total) by mouth daily. 90 capsule 3   DULoxetine  (CYMBALTA ) 60 MG capsule Take 1 capsule (60 mg total) by mouth daily. 90 capsule 3   famotidine  (PEPCID ) 20 MG tablet Take 1 tablet (20 mg total) by mouth at bedtime. 90 tablet 3   Flaxseed, Linseed, (FLAXSEED OIL PO) Take 1 capsule by mouth daily.     fluconazole  (DIFLUCAN ) 150 MG tablet Take 1 tablet (150 mg total) by mouth every three (3) days as needed (yeast infection treatment/prevention). 20 tablet 0   furosemide  (LASIX ) 40 MG tablet Take 1 tablet (40 mg total) by mouth 2 (two) times daily. 180 tablet 3   gabapentin  (NEURONTIN ) 300 MG capsule Take 3 capsules (900 mg total) by mouth daily. 270 capsule 3   ibuprofen (ADVIL,MOTRIN) 200 MG tablet Take 800 mg by mouth every 6 (six) hours as needed for moderate pain.     ketoconazole  (NIZORAL ) 2 % cream Apply topically 2 (two) times daily. 15 g 3   levothyroxine  (SYNTHROID ) 100 MCG tablet Take 1 tablet (100 mcg total) by mouth daily. 90 tablet 3   lidocaine  (LIDODERM ) 5 % Place 1 patch onto the skin daily. Remove & Discard patch within 12 hours or as directed by MD 90 patch 3   metFORMIN  (GLUCOPHAGE ) 1000 MG tablet Take 1 tablet (1,000 mg total) by mouth 2 (two) times daily. 180 tablet 3   mometasone (ELOCON) 0.1 % ointment Apply 1 Application topically daily.     nystatin  cream (MYCOSTATIN ) Apply 1 Application topically 2 (two) times daily as needed. 30 g 3   olmesartan  (BENICAR ) 40 MG tablet Take 1 tablet (40 mg total) by mouth daily. 90 tablet 3   Omega-3 Fatty Acids (FISH OIL) 300 MG CAPS Take 1 capsule by mouth daily.     Oxycodone  HCl 10 MG TABS Take 1 tablet (10 mg total) by mouth every 6 (six) hours as needed (pain). 120 tablet 0   pantoprazole  (PROTONIX ) 40 MG tablet Take 1 tablet (40 mg total) by mouth daily. 90 tablet 3    polyvinyl alcohol  (LIQUIFILM TEARS) 1.4 % ophthalmic solution Place 1 drop into both eyes as needed for dry eyes.     rOPINIRole  (REQUIP ) 3 MG tablet 1 tab po bid 180 tablet 3   simvastatin  (ZOCOR ) 40 MG tablet Take 1 tablet (40 mg total) by mouth daily. 90 tablet 3   Zinc Acetate, Oral, (ZINC ACETATE PO) Take 1 tablet by mouth daily.     tirzepatide  (MOUNJARO ) 2.5 MG/0.5ML Pen Inject 2.5 mg into the skin once a week. 2 mL 0   No facility-administered medications prior  to visit.    Allergies[1]  Review of Systems As per HPI  PE:    03/25/2024   10:22 AM 03/25/2024    8:48 AM 02/15/2024    9:48 AM  Vitals with BMI  Height 5' 3.15 5' 3 5' 3.15  Weight 255 lbs 10 oz 255 lbs 260 lbs 3 oz  BMI 45.06 45.18 45.87  Systolic 100 117 873  Diastolic 64 72 74  Pulse 94 92 74     Physical Exam  Gen: Alert, well appearing.  Patient is oriented to person, place, time, and situation. AFFECT: pleasant, lucid thought and speech. No further exam today  LABS:  Last CBC Lab Results  Component Value Date   WBC 7.0 02/15/2024   HGB 12.0 02/15/2024   HCT 36.4 02/15/2024   MCV 90.7 02/15/2024   MCH 29.3 09/15/2023   RDW 14.1 02/15/2024   PLT 229.0 02/15/2024   Last metabolic panel Lab Results  Component Value Date   GLUCOSE 135 (H) 02/15/2024   NA 138 02/15/2024   K 4.4 02/15/2024   CL 103 02/15/2024   CO2 28 02/15/2024   BUN 25 (H) 02/15/2024   CREATININE 0.87 02/15/2024   GFR 75.49 02/15/2024   CALCIUM 9.0 02/15/2024   PHOS 4.1 04/05/2023   PROT 7.0 02/15/2024   ALBUMIN 4.3 02/15/2024   BILITOT 0.4 02/15/2024   ALKPHOS 77 02/15/2024   AST 30 02/15/2024   ALT 44 (H) 02/15/2024   ANIONGAP 12 04/05/2023   Last lipids Lab Results  Component Value Date   CHOL 185 02/15/2024   HDL 50.90 02/15/2024   LDLCALC 115 (H) 02/15/2024   TRIG 95.0 02/15/2024   CHOLHDL 4 02/15/2024   Last hemoglobin A1c Lab Results  Component Value Date   HGBA1C 7.7 (H) 02/15/2024   Last  thyroid  functions Lab Results  Component Value Date   TSH 1.27 02/15/2024   FREET4 1.53 (H) 07/05/2022   Lab Results  Component Value Date   ESRSEDRATE 17 12/11/2023   Lab Results  Component Value Date   CRP 3.5 12/11/2023   Lab Results  Component Value Date   ANA NEGATIVE 02/16/2024   RF <10 02/16/2024   IMPRESSION AND PLAN:  Diabetes without complication. Doing well so far on Mounjaro . Increase Mounjaro  to 5 mg subcu weekly. Continue metformin  1000 mg twice a day. Urine microalbumin/creatinine today. Next A1c after 05/17/2024  An After Visit Summary was printed and given to the patient.  FOLLOW UP: Return in about 2 months (around 05/25/2024) for routine chronic illness f/u. Next CPE November 2026 Signed:  Gerlene Hockey, MD           03/25/2024       [1]  Allergies Allergen Reactions   Sulfa Antibiotics Hives

## 2024-03-26 LAB — C-REACTIVE PROTEIN: CRP: <3 mg/L

## 2024-03-26 LAB — SEDIMENTATION RATE: Sed Rate: 11 mm/h (ref 0–30)

## 2024-05-17 ENCOUNTER — Ambulatory Visit: Admitting: Family Medicine
